# Patient Record
Sex: Male | Born: 1945 | Race: Black or African American | Hispanic: No | Marital: Single | Smoking: Former smoker
Health system: Southern US, Community
[De-identification: ages and names within clinical notes are randomized; demographics above are authoritative.]

## PROBLEM LIST (undated history)

## (undated) DIAGNOSIS — K635 Polyp of colon: Secondary | ICD-10-CM

## (undated) DIAGNOSIS — E119 Type 2 diabetes mellitus without complications: Secondary | ICD-10-CM

## (undated) DIAGNOSIS — I10 Essential (primary) hypertension: Secondary | ICD-10-CM

## (undated) DIAGNOSIS — E11319 Type 2 diabetes mellitus with unspecified diabetic retinopathy without macular edema: Secondary | ICD-10-CM

## (undated) DIAGNOSIS — D72829 Elevated white blood cell count, unspecified: Secondary | ICD-10-CM

## (undated) DIAGNOSIS — F0390 Unspecified dementia without behavioral disturbance: Secondary | ICD-10-CM

## (undated) HISTORY — PX: HERNIA REPAIR: SHX51

## (undated) HISTORY — PX: COLONOSCOPY: SHX174

---

## 2017-03-22 ENCOUNTER — Ambulatory Visit: Payer: Medicare Other | Admitting: Podiatry

## 2021-04-10 ENCOUNTER — Emergency Department (HOSPITAL_COMMUNITY): Payer: No Typology Code available for payment source

## 2021-04-10 ENCOUNTER — Encounter (HOSPITAL_COMMUNITY)
Admission: EM | Disposition: A | Discharge status: Skilled Nursing Facility | Payer: Self-pay | Source: Home / Self Care | Attending: Internal Medicine

## 2021-04-10 ENCOUNTER — Inpatient Hospital Stay (HOSPITAL_COMMUNITY): Payer: No Typology Code available for payment source | Admitting: Anesthesiology

## 2021-04-10 ENCOUNTER — Other Ambulatory Visit: Payer: Self-pay

## 2021-04-10 ENCOUNTER — Inpatient Hospital Stay (HOSPITAL_COMMUNITY)
Admission: EM | Admit: 2021-04-10 | Discharge: 2021-04-27 | DRG: 270 | Disposition: A | Discharge status: Skilled Nursing Facility | Payer: No Typology Code available for payment source | Attending: Internal Medicine | Admitting: Internal Medicine

## 2021-04-10 ENCOUNTER — Encounter (HOSPITAL_COMMUNITY): Payer: Self-pay

## 2021-04-10 DIAGNOSIS — I998 Other disorder of circulatory system: Secondary | ICD-10-CM

## 2021-04-10 DIAGNOSIS — I70261 Atherosclerosis of native arteries of extremities with gangrene, right leg: Secondary | ICD-10-CM | POA: Diagnosis not present

## 2021-04-10 DIAGNOSIS — Z20822 Contact with and (suspected) exposure to covid-19: Secondary | ICD-10-CM | POA: Diagnosis present

## 2021-04-10 DIAGNOSIS — I82411 Acute embolism and thrombosis of right femoral vein: Secondary | ICD-10-CM | POA: Diagnosis not present

## 2021-04-10 DIAGNOSIS — I70221 Atherosclerosis of native arteries of extremities with rest pain, right leg: Secondary | ICD-10-CM | POA: Diagnosis present

## 2021-04-10 DIAGNOSIS — F03918 Unspecified dementia, unspecified severity, with other behavioral disturbance: Secondary | ICD-10-CM | POA: Diagnosis present

## 2021-04-10 DIAGNOSIS — I743 Embolism and thrombosis of arteries of the lower extremities: Secondary | ICD-10-CM | POA: Diagnosis not present

## 2021-04-10 DIAGNOSIS — L02214 Cutaneous abscess of groin: Secondary | ICD-10-CM | POA: Diagnosis present

## 2021-04-10 DIAGNOSIS — E119 Type 2 diabetes mellitus without complications: Secondary | ICD-10-CM

## 2021-04-10 DIAGNOSIS — I10 Essential (primary) hypertension: Secondary | ICD-10-CM | POA: Diagnosis present

## 2021-04-10 DIAGNOSIS — C9110 Chronic lymphocytic leukemia of B-cell type not having achieved remission: Secondary | ICD-10-CM | POA: Diagnosis present

## 2021-04-10 DIAGNOSIS — E872 Acidosis, unspecified: Secondary | ICD-10-CM | POA: Diagnosis present

## 2021-04-10 DIAGNOSIS — C8440 Peripheral T-cell lymphoma, not classified, unspecified site: Secondary | ICD-10-CM | POA: Diagnosis present

## 2021-04-10 DIAGNOSIS — L89302 Pressure ulcer of unspecified buttock, stage 2: Secondary | ICD-10-CM | POA: Diagnosis not present

## 2021-04-10 DIAGNOSIS — G9341 Metabolic encephalopathy: Secondary | ICD-10-CM | POA: Diagnosis present

## 2021-04-10 DIAGNOSIS — Z79899 Other long term (current) drug therapy: Secondary | ICD-10-CM | POA: Diagnosis not present

## 2021-04-10 DIAGNOSIS — D649 Anemia, unspecified: Secondary | ICD-10-CM | POA: Diagnosis not present

## 2021-04-10 DIAGNOSIS — F0390 Unspecified dementia without behavioral disturbance: Secondary | ICD-10-CM | POA: Diagnosis not present

## 2021-04-10 DIAGNOSIS — E113399 Type 2 diabetes mellitus with moderate nonproliferative diabetic retinopathy without macular edema, unspecified eye: Secondary | ICD-10-CM

## 2021-04-10 DIAGNOSIS — N179 Acute kidney failure, unspecified: Secondary | ICD-10-CM | POA: Diagnosis present

## 2021-04-10 DIAGNOSIS — Z7984 Long term (current) use of oral hypoglycemic drugs: Secondary | ICD-10-CM

## 2021-04-10 DIAGNOSIS — Z01810 Encounter for preprocedural cardiovascular examination: Secondary | ICD-10-CM | POA: Diagnosis not present

## 2021-04-10 DIAGNOSIS — E1151 Type 2 diabetes mellitus with diabetic peripheral angiopathy without gangrene: Secondary | ICD-10-CM | POA: Diagnosis present

## 2021-04-10 DIAGNOSIS — D479 Neoplasm of uncertain behavior of lymphoid, hematopoietic and related tissue, unspecified: Secondary | ICD-10-CM

## 2021-04-10 DIAGNOSIS — R739 Hyperglycemia, unspecified: Secondary | ICD-10-CM

## 2021-04-10 DIAGNOSIS — F05 Delirium due to known physiological condition: Secondary | ICD-10-CM | POA: Diagnosis present

## 2021-04-10 DIAGNOSIS — L89892 Pressure ulcer of other site, stage 2: Secondary | ICD-10-CM | POA: Diagnosis present

## 2021-04-10 DIAGNOSIS — F17210 Nicotine dependence, cigarettes, uncomplicated: Secondary | ICD-10-CM | POA: Diagnosis present

## 2021-04-10 DIAGNOSIS — D696 Thrombocytopenia, unspecified: Secondary | ICD-10-CM | POA: Diagnosis present

## 2021-04-10 DIAGNOSIS — D62 Acute posthemorrhagic anemia: Secondary | ICD-10-CM | POA: Diagnosis not present

## 2021-04-10 DIAGNOSIS — D72829 Elevated white blood cell count, unspecified: Secondary | ICD-10-CM | POA: Diagnosis not present

## 2021-04-10 DIAGNOSIS — E1165 Type 2 diabetes mellitus with hyperglycemia: Secondary | ICD-10-CM | POA: Diagnosis present

## 2021-04-10 DIAGNOSIS — L8990 Pressure ulcer of unspecified site, unspecified stage: Secondary | ICD-10-CM | POA: Insufficient documentation

## 2021-04-10 HISTORY — DX: Polyp of colon: K63.5

## 2021-04-10 HISTORY — DX: Unspecified dementia, unspecified severity, without behavioral disturbance, psychotic disturbance, mood disturbance, and anxiety: F03.90

## 2021-04-10 HISTORY — DX: Type 2 diabetes mellitus without complications: E11.9

## 2021-04-10 HISTORY — DX: Essential (primary) hypertension: I10

## 2021-04-10 HISTORY — PX: THROMBECTOMY FEMORAL ARTERY: SHX6406

## 2021-04-10 HISTORY — DX: Type 2 diabetes mellitus with unspecified diabetic retinopathy without macular edema: E11.319

## 2021-04-10 HISTORY — PX: FASCIOTOMY: SHX132

## 2021-04-10 HISTORY — PX: FEMORAL-POPLITEAL BYPASS GRAFT: SHX937

## 2021-04-10 LAB — CBC WITH DIFFERENTIAL/PLATELET
Abs Immature Granulocytes: 0 10*3/uL (ref 0.00–0.07)
Basophils Absolute: 0 10*3/uL (ref 0.0–0.1)
Basophils Relative: 0 %
Eosinophils Absolute: 0 10*3/uL (ref 0.0–0.5)
Eosinophils Relative: 0 %
HCT: 33.7 % — ABNORMAL LOW (ref 39.0–52.0)
Hemoglobin: 11 g/dL — ABNORMAL LOW (ref 13.0–17.0)
Lymphocytes Relative: 85 %
Lymphs Abs: 112.4 10*3/uL — ABNORMAL HIGH (ref 0.7–4.0)
MCH: 29.5 pg (ref 26.0–34.0)
MCHC: 32.6 g/dL (ref 30.0–36.0)
MCV: 90.3 fL (ref 80.0–100.0)
Monocytes Absolute: 4 10*3/uL — ABNORMAL HIGH (ref 0.1–1.0)
Monocytes Relative: 3 %
Neutro Abs: 15.9 10*3/uL — ABNORMAL HIGH (ref 1.7–7.7)
Neutrophils Relative %: 12 %
Platelets: 158 10*3/uL (ref 150–400)
RBC: 3.73 MIL/uL — ABNORMAL LOW (ref 4.22–5.81)
RDW: 13 % (ref 11.5–15.5)
WBC: 132.2 10*3/uL (ref 4.0–10.5)
nRBC: 0 % (ref 0.0–0.2)
nRBC: 0 /100 WBC

## 2021-04-10 LAB — LACTIC ACID, PLASMA
Lactic Acid, Venous: 1.9 mmol/L (ref 0.5–1.9)
Lactic Acid, Venous: 2.1 mmol/L (ref 0.5–1.9)

## 2021-04-10 LAB — BASIC METABOLIC PANEL
Anion gap: 11 (ref 5–15)
BUN: 36 mg/dL — ABNORMAL HIGH (ref 8–23)
CO2: 27 mmol/L (ref 22–32)
Calcium: 9.3 mg/dL (ref 8.9–10.3)
Chloride: 99 mmol/L (ref 98–111)
Creatinine, Ser: 1.1 mg/dL (ref 0.61–1.24)
GFR, Estimated: >60 mL/min (ref >60)
Glucose, Bld: 367 mg/dL — ABNORMAL HIGH (ref 70–99)
Potassium: 4.7 mmol/L (ref 3.5–5.1)
Sodium: 137 mmol/L (ref 135–145)

## 2021-04-10 LAB — RESP PANEL BY RT-PCR (FLU A&B, COVID) ARPGX2
Influenza A by PCR: NEGATIVE
Influenza B by PCR: NEGATIVE
SARS Coronavirus 2 by RT PCR: NEGATIVE

## 2021-04-10 LAB — CBC
HCT: 38.7 % — ABNORMAL LOW (ref 39.0–52.0)
HCT: 32.4 % — ABNORMAL LOW (ref 39.0–52.0)
Hemoglobin: 12.4 g/dL — ABNORMAL LOW (ref 13.0–17.0)
Hemoglobin: 10.5 g/dL — ABNORMAL LOW (ref 13.0–17.0)
MCH: 29.2 pg (ref 26.0–34.0)
MCH: 29.6 pg (ref 26.0–34.0)
MCHC: 32 g/dL (ref 30.0–36.0)
MCHC: 32.4 g/dL (ref 30.0–36.0)
MCV: 91.3 fL (ref 80.0–100.0)
MCV: 91.3 fL (ref 80.0–100.0)
Platelets: 169 10*3/uL (ref 150–400)
Platelets: 142 10*3/uL — ABNORMAL LOW (ref 150–400)
RBC: 4.24 MIL/uL (ref 4.22–5.81)
RBC: 3.55 MIL/uL — ABNORMAL LOW (ref 4.22–5.81)
RDW: 13.2 % (ref 11.5–15.5)
RDW: 13 % (ref 11.5–15.5)
WBC: 134.8 10*3/uL (ref 4.0–10.5)
WBC: 134 10*3/uL (ref 4.0–10.5)
nRBC: 0 % (ref 0.0–0.2)
nRBC: 0 % (ref 0.0–0.2)

## 2021-04-10 LAB — SAVE SMEAR(SSMR), FOR PROVIDER SLIDE REVIEW

## 2021-04-10 LAB — HEMOGLOBIN A1C
Hgb A1c MFr Bld: 11.5 % — ABNORMAL HIGH (ref 4.8–5.6)
Mean Plasma Glucose: 283.35 mg/dL

## 2021-04-10 LAB — CBG MONITORING, ED: Glucose-Capillary: 380 mg/dL — ABNORMAL HIGH (ref 70–99)

## 2021-04-10 LAB — TECHNOLOGIST SMEAR REVIEW

## 2021-04-10 LAB — CK: Total CK: 266 U/L (ref 49–397)

## 2021-04-10 SURGERY — THROMBECTOMY, ARTERY, FEMORAL
Anesthesia: General | Site: Leg Upper | Laterality: Right

## 2021-04-10 MED ORDER — INSULIN REGULAR(HUMAN) IN NACL 100-0.9 UT/100ML-% IV SOLN
INTRAVENOUS | Status: AC
  Administered 2021-04-10: 14 [IU]/h via INTRAVENOUS
  Filled 2021-04-10: qty 100

## 2021-04-10 MED ORDER — LACTATED RINGERS IV BOLUS
1000.0000 mL | Freq: Once | INTRAVENOUS | Status: AC
  Administered 2021-04-10: 1000 mL via INTRAVENOUS

## 2021-04-10 MED ORDER — CEFAZOLIN SODIUM 1 G IJ SOLR
INTRAMUSCULAR | Status: AC
  Filled 2021-04-10: qty 30

## 2021-04-10 MED ORDER — LACTATED RINGERS IV SOLN: INTRAVENOUS | Status: DC | PRN

## 2021-04-10 MED ORDER — PIPERACILLIN-TAZOBACTAM 3.375 G IVPB 30 MIN
3.3750 g | Freq: Once | INTRAVENOUS | Status: AC
  Administered 2021-04-10: 3.375 g via INTRAVENOUS
  Filled 2021-04-10: qty 50

## 2021-04-10 MED ORDER — CEFAZOLIN SODIUM-DEXTROSE 2-3 GM-%(50ML) IV SOLR
INTRAVENOUS | Status: DC | PRN
Start: 2021-04-10 — End: 2021-04-11
  Administered 2021-04-10: 2 g via INTRAVENOUS

## 2021-04-10 MED ORDER — PROPOFOL 10 MG/ML IV BOLUS
INTRAVENOUS | Status: DC | PRN
  Administered 2021-04-10: 100 mg via INTRAVENOUS

## 2021-04-10 MED ORDER — FENTANYL CITRATE PF 50 MCG/ML IJ SOSY
50.0000 ug | PREFILLED_SYRINGE | Freq: Once | INTRAMUSCULAR | Status: AC
Start: 2021-04-10 — End: 2021-04-10
  Administered 2021-04-10: 50 ug via INTRAVENOUS
  Filled 2021-04-10: qty 1

## 2021-04-10 MED ORDER — HEPARIN 6000 UNIT IRRIGATION SOLUTION
Status: DC | PRN
  Administered 2021-04-10: 1

## 2021-04-10 MED ORDER — AMISULPRIDE (ANTIEMETIC) 5 MG/2ML IV SOLN: 10.0000 mg | Freq: Once | INTRAVENOUS | Status: DC | PRN

## 2021-04-10 MED ORDER — HEPARIN SODIUM (PORCINE) 1000 UNIT/ML IJ SOLN
INTRAMUSCULAR | Status: DC | PRN
  Administered 2021-04-10: 6000 [IU] via INTRAVENOUS
  Administered 2021-04-11: 5000 [IU] via INTRAVENOUS

## 2021-04-10 MED ORDER — ALBUMIN HUMAN 5 % IV SOLN
INTRAVENOUS | Status: DC | PRN
Start: 2021-04-10 — End: 2021-04-11

## 2021-04-10 MED ORDER — HEPARIN 6000 UNIT IRRIGATION SOLUTION
Status: AC
  Filled 2021-04-10: qty 500

## 2021-04-10 MED ORDER — ONDANSETRON HCL 4 MG/2ML IJ SOLN: 4.0000 mg | Freq: Four times a day (QID) | INTRAMUSCULAR | Status: DC | PRN

## 2021-04-10 MED ORDER — ACETAMINOPHEN 650 MG RE SUPP: 650.0000 mg | Freq: Four times a day (QID) | RECTAL | Status: DC | PRN

## 2021-04-10 MED ORDER — PROMETHAZINE HCL 25 MG/ML IJ SOLN: 6.2500 mg | INTRAMUSCULAR | Status: DC | PRN

## 2021-04-10 MED ORDER — INSULIN ASPART 100 UNIT/ML IJ SOLN: 0.0000 [IU] | INTRAMUSCULAR | Status: DC

## 2021-04-10 MED ORDER — 0.9 % SODIUM CHLORIDE (POUR BTL) OPTIME
TOPICAL | Status: DC | PRN
  Administered 2021-04-10: 2000 mL

## 2021-04-10 MED ORDER — HYDROMORPHONE HCL 1 MG/ML IJ SOLN
0.5000 mg | INTRAMUSCULAR | Status: DC | PRN
  Administered 2021-04-13 – 2021-04-17 (×13): 1 mg via INTRAVENOUS
  Filled 2021-04-10 (×15): qty 1

## 2021-04-10 MED ORDER — FENTANYL CITRATE (PF) 250 MCG/5ML IJ SOLN
INTRAMUSCULAR | Status: DC | PRN
  Administered 2021-04-10 – 2021-04-11 (×6): 50 ug via INTRAVENOUS

## 2021-04-10 MED ORDER — FENTANYL CITRATE (PF) 250 MCG/5ML IJ SOLN
INTRAMUSCULAR | Status: AC
  Filled 2021-04-10: qty 5

## 2021-04-10 MED ORDER — HEPARIN (PORCINE) 25000 UT/250ML-% IV SOLN
1150.0000 [IU]/h | INTRAVENOUS | Status: DC
  Filled 2021-04-10 (×2): qty 250

## 2021-04-10 MED ORDER — ACETAMINOPHEN 325 MG PO TABS
650.0000 mg | ORAL_TABLET | Freq: Four times a day (QID) | ORAL | Status: DC | PRN
  Administered 2021-04-11 – 2021-04-21 (×7): 650 mg via ORAL
  Filled 2021-04-10 (×7): qty 2

## 2021-04-10 MED ORDER — PROPOFOL 10 MG/ML IV BOLUS
INTRAVENOUS | Status: AC
  Filled 2021-04-10: qty 20

## 2021-04-10 MED ORDER — FENTANYL CITRATE (PF) 100 MCG/2ML IJ SOLN: 25.0000 ug | INTRAMUSCULAR | Status: DC | PRN

## 2021-04-10 MED ORDER — HEPARIN BOLUS VIA INFUSION
4000.0000 [IU] | Freq: Once | INTRAVENOUS | Status: DC
  Filled 2021-04-10: qty 4000

## 2021-04-10 MED ORDER — SUCCINYLCHOLINE CHLORIDE 200 MG/10ML IV SOSY
PREFILLED_SYRINGE | INTRAVENOUS | Status: DC | PRN
  Administered 2021-04-10: 120 mg via INTRAVENOUS

## 2021-04-10 MED ORDER — LIDOCAINE HCL (CARDIAC) PF 100 MG/5ML IV SOSY
PREFILLED_SYRINGE | INTRAVENOUS | Status: DC | PRN
  Administered 2021-04-10: 100 mg via INTRATRACHEAL

## 2021-04-10 MED ORDER — LACTATED RINGERS IV SOLN: INTRAVENOUS | Status: DC

## 2021-04-10 MED ORDER — ONDANSETRON HCL 4 MG PO TABS: 4.0000 mg | ORAL_TABLET | Freq: Four times a day (QID) | ORAL | Status: DC | PRN

## 2021-04-10 MED ORDER — PHENYLEPHRINE HCL-NACL 20-0.9 MG/250ML-% IV SOLN
INTRAVENOUS | Status: DC | PRN
  Administered 2021-04-10: 20 ug/min via INTRAVENOUS

## 2021-04-10 SURGICAL SUPPLY — 70 items
BAG COUNTER SPONGE SURGICOUNT (BAG) ×3 IMPLANT
BANDAGE ESMARK 6X9 LF (GAUZE/BANDAGES/DRESSINGS) IMPLANT
BNDG ELASTIC 4X5.8 VLCR STR LF (GAUZE/BANDAGES/DRESSINGS) IMPLANT
BNDG ELASTIC 6X5.8 VLCR STR LF (GAUZE/BANDAGES/DRESSINGS) ×3 IMPLANT
BNDG ESMARK 6X9 LF (GAUZE/BANDAGES/DRESSINGS)
BNDG GAUZE ELAST 4 BULKY (GAUZE/BANDAGES/DRESSINGS) ×6 IMPLANT
CANISTER SUCT 3000ML PPV (MISCELLANEOUS) ×3 IMPLANT
CANNULA VESSEL 3MM 2 BLNT TIP (CANNULA) IMPLANT
CATH EMB 3FR 80CM (CATHETERS) ×6 IMPLANT
CATH EMB 4FR 80CM (CATHETERS) ×3 IMPLANT
CATH EMB 5FR 80CM (CATHETERS) IMPLANT
CLIP LIGATING EXTRA MED SLVR (CLIP) ×3 IMPLANT
CLIP LIGATING EXTRA SM BLUE (MISCELLANEOUS) ×3 IMPLANT
CLIP VESOCCLUDE SM WIDE 24/CT (CLIP) ×3 IMPLANT
CNTNR URN SCR LID CUP LEK RST (MISCELLANEOUS) ×6 IMPLANT
CONT SPEC 4OZ STRL OR WHT (MISCELLANEOUS) ×3
CUFF TOURN SGL QUICK 24 (TOURNIQUET CUFF)
CUFF TOURN SGL QUICK 34 (TOURNIQUET CUFF)
CUFF TOURN SGL QUICK 42 (TOURNIQUET CUFF) IMPLANT
CUFF TRNQT CYL 24X4X16.5-23 (TOURNIQUET CUFF) IMPLANT
CUFF TRNQT CYL 34X4.125X (TOURNIQUET CUFF) IMPLANT
DERMABOND ADVANCED (GAUZE/BANDAGES/DRESSINGS) ×1
DERMABOND ADVANCED .7 DNX12 (GAUZE/BANDAGES/DRESSINGS) ×2 IMPLANT
DRAIN CHANNEL 15F RND FF W/TCR (WOUND CARE) IMPLANT
DRAPE HALF SHEET 40X57 (DRAPES) IMPLANT
DRAPE X-RAY CASS 24X20 (DRAPES) IMPLANT
DRSG COVADERM 4X6 (GAUZE/BANDAGES/DRESSINGS) ×3 IMPLANT
DRSG COVADERM 4X8 (GAUZE/BANDAGES/DRESSINGS) ×3 IMPLANT
ELECT REM PT RETURN 9FT ADLT (ELECTROSURGICAL) ×3
ELECTRODE REM PT RTRN 9FT ADLT (ELECTROSURGICAL) ×2 IMPLANT
EVACUATOR SILICONE 100CC (DRAIN) IMPLANT
GLOVE SURG ENC MOIS LTX SZ7.5 (GLOVE) ×3 IMPLANT
GLOVE SURG POLYISO LF SZ6.5 (GLOVE) ×3 IMPLANT
GOWN STRL REUS W/ TWL LRG LVL3 (GOWN DISPOSABLE) ×4 IMPLANT
GOWN STRL REUS W/ TWL XL LVL3 (GOWN DISPOSABLE) ×2 IMPLANT
GOWN STRL REUS W/TWL LRG LVL3 (GOWN DISPOSABLE) ×2
GOWN STRL REUS W/TWL XL LVL3 (GOWN DISPOSABLE) ×1
GRAFT PROPATEN W/RING 6X80X60 (Vascular Products) ×3 IMPLANT
INSERT FOGARTY SM (MISCELLANEOUS) ×3 IMPLANT
KIT BASIN OR (CUSTOM PROCEDURE TRAY) ×3 IMPLANT
KIT TURNOVER KIT B (KITS) ×3 IMPLANT
MARKER GRAFT CORONARY BYPASS (MISCELLANEOUS) IMPLANT
NS IRRIG 1000ML POUR BTL (IV SOLUTION) ×6 IMPLANT
PACK PERIPHERAL VASCULAR (CUSTOM PROCEDURE TRAY) ×3 IMPLANT
PAD ARMBOARD 7.5X6 YLW CONV (MISCELLANEOUS) ×6 IMPLANT
POWDER SURGICEL 3.0 GRAM (HEMOSTASIS) ×6 IMPLANT
SET COLLECT BLD 21X3/4 12 (NEEDLE) IMPLANT
STAPLER VISISTAT 35W (STAPLE) ×3 IMPLANT
STOPCOCK 4 WAY LG BORE MALE ST (IV SETS) IMPLANT
SUT ETHILON 3 0 PS 1 (SUTURE) IMPLANT
SUT GORETEX 6.0 TT13 (SUTURE) IMPLANT
SUT GORETEX 6.0 TT9 (SUTURE) IMPLANT
SUT MNCRL AB 4-0 PS2 18 (SUTURE) ×3 IMPLANT
SUT PROLENE 5 0 C 1 24 (SUTURE) ×9 IMPLANT
SUT PROLENE 6 0 BV (SUTURE) ×18 IMPLANT
SUT PROLENE 7 0 BV 1 (SUTURE) IMPLANT
SUT SILK 2 0 SH (SUTURE) ×3 IMPLANT
SUT SILK 3 0 (SUTURE) ×1
SUT SILK 3-0 18XBRD TIE 12 (SUTURE) ×2 IMPLANT
SUT VIC AB 2-0 CT1 27 (SUTURE) ×2
SUT VIC AB 2-0 CT1 TAPERPNT 27 (SUTURE) ×4 IMPLANT
SUT VIC AB 3-0 SH 27 (SUTURE) ×3
SUT VIC AB 3-0 SH 27X BRD (SUTURE) ×6 IMPLANT
SYR 3ML LL SCALE MARK (SYRINGE) ×6 IMPLANT
TAPE UMBILICAL COTTON 1/8X30 (MISCELLANEOUS) IMPLANT
TOWEL GREEN STERILE (TOWEL DISPOSABLE) ×3 IMPLANT
TRAY FOLEY MTR SLVR 16FR STAT (SET/KITS/TRAYS/PACK) IMPLANT
TUBING EXTENTION W/L.L. (IV SETS) IMPLANT
UNDERPAD 30X36 HEAVY ABSORB (UNDERPADS AND DIAPERS) ×3 IMPLANT
WATER STERILE IRR 1000ML POUR (IV SOLUTION) ×3 IMPLANT

## 2021-04-10 NOTE — ED Triage Notes (Signed)
BIB EMS after daughter found patient in the floor and unable to get up.  Patient has hx of dementia and is only oriented x person.  Patient cbg with EMS >600.  Unknown when patient last ate even though caregiver comes in.  Daughter at bedside.

## 2021-04-10 NOTE — Consult Note (Signed)
Hospital Consult    Reason for Consult: Acute right lower extremity ischemia Referring Physician: Dr. Melina Copa MRN #:  [id number]  History of Present Illness 75 y.o. male without significant past vascular history.  He is a longtime smoker for least 50 years and also has diabetes.  History is limited by dementia but his daughter is able to provide adequate history.  He was last seen well last Wednesday by a caregiver.  She attempted to contact him on Friday but he would not answer the door.  The daughter was unable to make contact with him today where he was found to be on the floor for unknown period of time.  Patient was having difficulty walking due to pain in his right leg.  He has been unable to tell how long he was down given history of dementia.  Previously walks without limitations.  He has no previous lower extremity surgeries.  He does not take any blood thinners.  Previously took low-dose aspirin does not take any longer.  He cannot recall any abnormal heart rhythms or personal or family history of aneurysm disease.  Past Medical History:  Diagnosis Date   Dementia ([place])    Diabetes mellitus without complication ([place])      No Known Allergies  Prior to Admission medications   Medication Sig Start Date End Date Taking? Authorizing Provider  Cholecalciferol 25 MCG (1000 UT) TBDP Take 1 tablet by mouth daily.   Yes [provider]  donepezil (ARICEPT) 10 MG tablet Take 10 mg by mouth at bedtime.   Yes [provider]  lisinopril (ZESTRIL) 5 MG tablet Take 5 mg by mouth daily.   Yes [provider]  metFORMIN (GLUCOPHAGE) 500 MG tablet Take 500 mg by mouth 2 (two) times daily with a meal.   Yes [provider]    Social History   Socioeconomic History   Marital status: Unknown    Spouse name: Not on file   Number of children: Not on file   Years of education: Not on file   Highest education level: Not on file  Occupational History   Not on  file  Tobacco Use   Smoking status: Not on file   Smokeless tobacco: Not on file  Substance and Sexual Activity   Alcohol use: Not on file   Drug use: Not on file   Sexual activity: Not on file  Other Topics Concern   Not on file  Social History Narrative   Not on file   Social Determinants of Health   Financial Resource Strain: Not on file  Food Insecurity: Not on file  Transportation Needs: Not on file  Physical Activity: Not on file  Stress: Not on file  Social Connections: Not on file  Intimate Partner Violence: Not on file     No family history on file.  ROS: Right lower extremity pain  Physical Examination  Vitals:   04/10/21 1845 04/10/21 1930  BP: (!) 146/101 (!) 132/116  Pulse: (!) 104 84  Resp: 16 18  Temp:    SpO2: 92% 99%   There is no height or weight on file to calculate BMI.  General: No acute distress HENT: WNL, normocephalic Pulmonary: normal non-labored breathing Cardiac: 2+ palpable common femoral pulses Left popliteal pulses 3+, right popliteal pulses not palpable Palpable left PT  No signals discernible on the right Abdomen: soft, NT/ND, no masses Extremities: Right foot is ice cold from the midfoot with mottling distally and dark discoloration of the  tips of the toes extending back to the head of the first metatarsal Neurologic: Normal sensation and motor function of left lower extremity Right lower extremity he is able to bend the knee although appears weaker than left, he has sensation to the mid calf area no sensation distal to this Psychiatric: Answers questions appropriately   CBC    Component Value Date/Time   WBC 134.8 (HH) 04/10/2021 1841   RBC 4.24 04/10/2021 1841   HGB 12.4 (L) 04/10/2021 1841   HCT 38.7 (L) 04/10/2021 1841   PLT 169 04/10/2021 1841   MCV 91.3 04/10/2021 1841   MCH 29.2 04/10/2021 1841   MCHC 32.0 04/10/2021 1841   RDW 13.2 04/10/2021 1841    BMET    Component Value Date/Time   NA 137 04/10/2021  1841   K 4.7 04/10/2021 1841   CL 99 04/10/2021 1841   CO2 27 04/10/2021 1841   GLUCOSE 367 (H) 04/10/2021 1841   BUN 36 (H) 04/10/2021 1841   CREATININE 1.10 04/10/2021 1841   CALCIUM 9.3 04/10/2021 1841   GFRNONAA >60 04/10/2021 1841    COAGS: No results found for: INR, PROTIME   Non-Invasive Vascular Imaging:   No studies   ASSESSMENT/PLAN: This is a 75 y.o. male here with acute right lower extremity ischemia of unknown etiology or duration.  He also has leukocytosis with white blood cell count measuring 135,000 today.  I discussed with the daughter that certainly the foot is ischemic but does not account for that high of leukocytosis.  This may certainly be related to his ischemia either the cause or the an effect.  I discussed with her that he will likely require at least amputation of his toes, more likely at least midfoot but possibly below-knee amputation during this hospitalization.  I discussed with her the need for urgent revascularization to attempt limb salvage tonight.  Heparin drip was started prior to transfer to [facility].  We will plan for right lower extremity thrombectomy from a below-knee approach.  I have some concern that this could be from a thrombosed right popliteal artery aneurysm given the size of the left popliteal artery however I do not feel any enlarged area behind his knee to suggest there was an aneurysm.  There may also be a chronic component to this given his long history of smoking and diabetes which would complicate revascularization.  I discussed with the daughter possible bypass and likely need for fasciotomies all of which may fail due to the above-mentioned complexities.  Surgery also brings with the risk of cardiopulmonary complications which may require prolonged intubation could result in coronary artery ischemia as well.  There are risks with anesthesia, there is a risk of revascularization making him sicker.  She demonstrates good understanding and  we will plan for right lower extremity revascularization tonight with fasciotomies.  Lancer Thurner C. Donzetta Matters, MD [facility] Office: [phone] Pager: [phone]

## 2021-04-10 NOTE — H&P (Addendum)
History and Physical    Scott Garrett LGX:[id number] DOB: Jul 10, 1945 DOA: 04/10/2021  PCP: [name]  Patient coming from: Home  I have personally briefly reviewed patient's old medical records in [place]  Chief Complaint: Found down  HPI: Cong Hightower is a 75 y.o. male with medical history significant of dementia, DM2, HTN.  Pt lives at home alone but caregivers come in 3 times a week.  Pt disoriented at baseline.  Today is Monday and the caregiver last saw him on Wednesday.  On Friday, the caregiver went to his house but he told her not to come in and she did not.  Today daughter got back and went to check on him and had to unlock the door because he would not let her in.  She found him sitting on the floor.  It seems like he has been urinating and bottles and is unclear how long he has been on the floor.  R foot discolored, painful to touch.  Mental status seems to be at baseline (disoriented).  Pt complaining of R foot pain and thirst.   ED Course: CPK 266  BGL 367.  WBC 134.8k  was 10k in June 2021.  Lactate 1.9.   Review of Systems: Unable to perform due to dementia.  Past Medical History:  Diagnosis Date   Colon polyps    Dementia ([place])    Diabetic retinopathy ([place])    DM2 (diabetes mellitus, type 2) ([place])    HTN (hypertension)     Past Surgical History:  Procedure Laterality Date   COLONOSCOPY       reports that he has been smoking cigarettes. He does not have any smokeless tobacco history on file. No history on file for alcohol use and drug use.  No Known Allergies  Family History  Family history unknown: Yes     Prior to Admission medications   Medication Sig Start Date End Date Taking? Authorizing Provider  Cholecalciferol 25 MCG (1000 UT) TBDP Take 1 tablet by mouth daily.   Yes [provider]  donepezil (ARICEPT) 10 MG tablet Take 10 mg by mouth at bedtime.   Yes [provider]  lisinopril  (ZESTRIL) 5 MG tablet Take 5 mg by mouth daily.   Yes [provider]  metFORMIN (GLUCOPHAGE) 500 MG tablet Take 500 mg by mouth 2 (two) times daily with a meal.   Yes [provider]    Physical Exam: Vitals:   04/10/21 1840 04/10/21 1845 04/10/21 1930  BP: (!) 149/84 (!) 146/101 (!) 132/116  Pulse: (!) 102 (!) 104 84  Resp: 15 16 18   Temp: 98.4 F (36.9 C)    TempSrc: Oral    SpO2: 97% 92% 99%  Weight:   81.6 kg    Constitutional: NAD, calm, comfortable Eyes: PERRL, lids and conjunctivae normal ENMT: Mucous membranes are moist. Posterior pharynx clear of any exudate or lesions.Normal dentition.  Neck: normal, supple, no masses, no thyromegaly Respiratory: clear to auscultation bilaterally, no wheezing, no crackles. Normal respiratory effort. No accessory muscle use.  Cardiovascular: No doppler able pulse on R foot No carotid bruits.  Abdomen: no tenderness, no masses palpated. No hepatosplenomegaly. Bowel sounds positive.  Musculoskeletal:  No joint deformity upper and lower extremities. Good ROM, no contractures. Normal muscle tone.  Skin:  Neurologic: CN 2-12 grossly intact. Sensation intact, DTR normal. Strength 5/5 in all 4.  Psychiatric: Disoriented   Labs on Admission: I have personally reviewed following labs and imaging studies  CBC: Recent Labs  Lab 04/10/21 1841  WBC 134.8*  HGB 12.4*  HCT 38.7*  MCV 91.3  PLT 300   Basic Metabolic Panel: Recent Labs  Lab 04/10/21 1841  NA 137  K 4.7  CL 99  CO2 27  GLUCOSE 367*  BUN 36*  CREATININE 1.10  CALCIUM 9.3   GFR: CrCl cannot be calculated (Unknown ideal weight.). Liver Function Tests: No results for input(s): AST, ALT, ALKPHOS, BILITOT, PROT, ALBUMIN in the last 168 hours. No results for input(s): LIPASE, AMYLASE in the last 168 hours. No results for input(s): AMMONIA in the last 168 hours. Coagulation Profile: No results for input(s): INR, PROTIME in the last 168 hours. Cardiac  Enzymes: Recent Labs  Lab 04/10/21 1940  CKTOTAL 266   BNP (last 3 results) No results for input(s): PROBNP in the last 8760 hours. HbA1C: No results for input(s): HGBA1C in the last 72 hours. CBG: Recent Labs  Lab 04/10/21 1843  GLUCAP 380*   Lipid Profile: No results for input(s): CHOL, HDL, LDLCALC, TRIG, CHOLHDL, LDLDIRECT in the last 72 hours. Thyroid Function Tests: No results for input(s): TSH, T4TOTAL, FREET4, T3FREE, THYROIDAB in the last 72 hours. Anemia Panel: No results for input(s): VITAMINB12, FOLATE, FERRITIN, TIBC, IRON, RETICCTPCT in the last 72 hours. Urine analysis: No results found for: COLORURINE, APPEARANCEUR, LABSPEC, [place], GLUCOSEU, HGBUR, BILIRUBINUR, KETONESUR, PROTEINUR, UROBILINOGEN, NITRITE, LEUKOCYTESUR  Radiological Exams on Admission: DG Chest Port 1 View  Result Date: 04/10/2021 CLINICAL DATA:  Weakness EXAM: PORTABLE CHEST 1 VIEW COMPARISON:  None. FINDINGS: Heart and mediastinal contours are within normal limits. No focal opacities or effusions. No acute bony abnormality. IMPRESSION: No active disease. Electronically Signed   By: Rolm Baptise M.D.   On: 04/10/2021 20:31    EKG: Independently reviewed.  Assessment/Plan Principal Problem:   Critical limb ischemia of right lower extremity (HCC) Active Problems:   Dementia (HCC)   DM2 (diabetes mellitus, type 2) (HCC)   HTN (hypertension)    Critical limb ischemia RLE - Pt going to OR emergently for attempt at limb salvage. Repeat CPK in AM Repeat BMP at MN and again in AM Tele monitor Heparin gtt Serial lactates IVF Leukocytosis 134k - Getting zosyn pre-op Not clear that this infectious however, especially given the degree Question leukemia? Get CBC with diff stat Save smear and path smear review Depending on diff: may need onc consult, if acute leukemia may even need transfer to [facility], but going to OR emergently right now for attempted limb salvage. DM2 - Hold  metformin Sensitive SSI Q4H HTN - Holding home BP meds for the moment Use short acting IV if needed Dementia - Chronic and baseline for the moment, high risk of delirium during hospital stay though.  DVT prophylaxis: Heparin gtt Code Status: Full Family Communication: No family in room Disposition Plan: TBD Consults called: Vasc surgery Admission status: Admit to inpatient  Severity of Illness: The appropriate patient status for this patient is INPATIENT. Inpatient status is judged to be reasonable and necessary in order to provide the required intensity of service to ensure the patient's safety. The patient's presenting symptoms, physical exam findings, and initial radiographic and laboratory data in the context of their chronic comorbidities is felt to place them at high risk for further clinical deterioration. Furthermore, it is not anticipated that the patient will be medically stable for discharge from the hospital within 2 midnights of admission.   IP status for limb threatening ischemia requiring emergent OR intervention.  *  I certify that at the point of admission it is my clinical judgment that the patient will require inpatient hospital care spanning beyond 2 midnights from the point of admission due to high intensity of service, high risk for further deterioration and high frequency of surveillance required.*   Darrion Macaulay M. DO [facility]  How to contact the [facility] Attending or Consulting provider [place] or covering provider during after hours [place], for this patient?  Check the care team in [facility] and look for a) attending/consulting [facility] provider listed and b) the [facility] team listed Log into [email or web address]  Amion Physician Scheduling and messaging for groups and whole hospitals  On call and physician scheduling software for group practices, residents, hospitalists and other medical providers for call, clinic, rotation and shift schedules. OnCall [employer] is a  hospital-wide system for scheduling doctors and paging doctors on call. EasyPlot is for scientific plotting and data analysis.  [email or web address]  and use [place]'s universal password to access. If you do not have the password, please contact the hospital operator.  Locate the [facility] provider you are looking for under [facility] and page to a number that you can be directly reached. If you still have difficulty reaching the provider, please page the [facility] (Director on Call) for the Hospitalists listed on amion for assistance.  04/10/2021, 9:57 PM

## 2021-04-10 NOTE — Anesthesia Preprocedure Evaluation (Addendum)
Anesthesia Evaluation  Patient identified by MRN, date of birth, ID band Patient awake    Reviewed: Allergy & Precautions, NPO status , Patient's Chart, lab work & pertinent test results  History of Anesthesia Complications Negative for: history of anesthetic complications  Airway Mallampati: I  TM Distance: >3 FB Neck ROM: Full    Dental  (+) Edentulous Lower, Edentulous Upper, Dental Advisory Given   Pulmonary Current Smoker,    Pulmonary exam normal        Cardiovascular hypertension, Pt. on medications + Peripheral Vascular Disease  Normal cardiovascular exam     Neuro/Psych PSYCHIATRIC DISORDERS Dementia negative neurological ROS     GI/Hepatic negative GI ROS, Neg liver ROS,   Endo/Other  diabetes  Renal/GU negative Renal ROS     Musculoskeletal negative musculoskeletal ROS (+)   Abdominal   Peds  Hematology negative hematology ROS (+)   Anesthesia Other Findings   Reproductive/Obstetrics                            Anesthesia Physical Anesthesia Plan  ASA: 4 and emergent  Anesthesia Plan: General   Post-op Pain Management:    Induction: Intravenous  PONV Risk Score and Plan: 3 and Ondansetron and Diphenhydramine  Airway Management Planned: Oral ETT  Additional Equipment:   Intra-op Plan:   Post-operative Plan: Possible Post-op intubation/ventilation  Informed Consent: I have reviewed the patients History and Physical, chart, labs and discussed the procedure including the risks, benefits and alternatives for the proposed anesthesia with the patient or authorized representative who has indicated his/her understanding and acceptance.     Dental advisory given and Consent reviewed with POA  Plan Discussed with: Anesthesiologist and CRNA  Anesthesia Plan Comments:        Anesthesia Quick Evaluation

## 2021-04-10 NOTE — ED Notes (Signed)
[facility] left without heparin started. Critical wbc of 134 called reported to md

## 2021-04-10 NOTE — ED Notes (Signed)
[facility] called for transfer to [facility] ED  Accepting physician Donzetta Matters

## 2021-04-10 NOTE — ED Provider Notes (Signed)
[facility] DEPT Provider Note   CSN: [id number] Arrival date & time: 04/10/21  1821  LEVEL 5 CAVEAT - DEMENTIA   History Chief Complaint  Patient presents with   Hyperglycemia    Gen Clagg is a 75 y.o. male.  HPI 75 year old male presents via EMS after he was unable to get off the floor.  History is primarily from the daughter due to his dementia.  He lives at home alone but caregivers come in 3 times a week.  Today is Monday and the caregiver last saw him on Wednesday.  Daughter has been.  On Friday, the caregiver went to his house but he told her not to come in and she did not.  Today daughter got back and went to check on him and had to unlock the door because he would not let her in.  She found him sitting on the floor.  It seems like he has been urinating and bottles and is unclear how long he has been on the floor.  She knows so his right foot was discolored and seems to be painful when touched.  Otherwise he is disoriented at baseline but seems to be having normal mental status today per the daughter.  First thing he asked for was cups of water and seemed to be very thirsty.  Past Medical History:  Diagnosis Date   Dementia ([place])    Diabetes mellitus without complication ([place])     There are no problems to display for this patient.      No family history on file.     Home Medications Prior to Admission medications   Not on File    Allergies    Patient has no known allergies.  Review of Systems   Review of Systems  Unable to perform ROS: Dementia   Physical Exam Updated Vital Signs BP (!) 146/101   Pulse (!) 104   Temp 98.4 F (36.9 C) (Oral)   Resp 16   SpO2 92%   Physical Exam Vitals and nursing note reviewed.  Constitutional:      General: He is not in acute distress.    Appearance: He is well-developed. He is not ill-appearing or diaphoretic.  HENT:     Head: Normocephalic and atraumatic.     Right Ear:  External ear normal.     Left Ear: External ear normal.     Nose: Nose normal.     Mouth/Throat:     Mouth: Mucous membranes are dry.  Eyes:     General:        Right eye: No discharge.        Left eye: No discharge.  Cardiovascular:     Rate and Rhythm: Regular rhythm. Tachycardia present.     Pulses:          Popliteal pulses are 1+ on the right side and 2+ on the left side.       Dorsalis pedis pulses are 0 on the right side and detected w/ Doppler on the left side.       Posterior tibial pulses are 0 on the right side.     Heart sounds: Normal heart sounds.  Pulmonary:     Effort: Pulmonary effort is normal.     Breath sounds: Normal breath sounds.  Abdominal:     General: There is no distension.     Palpations: Abdomen is soft.     Tenderness: There is no abdominal tenderness.  Musculoskeletal:  Cervical back: Neck supple.     Comments: Right foot/ankle/lower leg are diffusely swollen. Cool to the touch from mid-lower leg down. See picture for discoloration. Painful to touch.  Skin:    General: Skin is warm and dry.  Neurological:     Mental Status: He is alert. He is disoriented.  Psychiatric:        Mood and Affect: Mood is not anxious.     ED Results / Procedures / Treatments   Labs (all labs ordered are listed, but only abnormal results are displayed) Labs Reviewed  CBC - Abnormal; Notable for the following components:      Result Value   Hemoglobin 12.4 (*)    HCT 38.7 (*)    All other components within normal limits  CBG MONITORING, ED - Abnormal; Notable for the following components:   Glucose-Capillary 380 (*)    All other components within normal limits  RESP PANEL BY RT-PCR (FLU A&B, COVID) ARPGX2  BASIC METABOLIC PANEL  URINALYSIS, ROUTINE W REFLEX MICROSCOPIC  LACTIC ACID, PLASMA  LACTIC ACID, PLASMA  CK  CBC    EKG None  Radiology No results found.  Procedures .Critical Care Performed by: Sherwood Gambler, MD Authorized by:  Sherwood Gambler, MD   Critical care provider statement:    Critical care time (minutes):  40   Critical care time was exclusive of:  Separately billable procedures and treating other patients   Critical care was necessary to treat or prevent imminent or life-threatening deterioration of the following conditions:  Circulatory failure   Critical care was time spent personally by me on the following activities:  Development of treatment plan with patient or surrogate, discussions with consultants, evaluation of patient's response to treatment, examination of patient, obtaining history from patient or surrogate, ordering and performing treatments and interventions, ordering and review of laboratory studies, pulse oximetry, re-evaluation of patient's condition and review of old charts   Medications Ordered in ED Medications  lactated ringers bolus 1,000 mL (1,000 mLs Intravenous New Bag/Given 04/10/21 1924)  fentaNYL (SUBLIMAZE) injection 50 mcg (50 mcg Intravenous Given 04/10/21 1924)    ED Course  I have reviewed the triage vital signs and the nursing notes.  Pertinent labs & imaging results that were available during my care of the patient were reviewed by me and considered in my medical decision making (see chart for details).    MDM Rules/Calculators/A&P                           There is no dopplerable or palpable pulse in the right foot.  Appears acutely ischemic.  I discussed with [name].  It is unclear how long this has been going on.  However at this point he asked for IV heparin and transfer emergently to [facility], ED.  Discussed with Dr. Matilde Sprang who accepts.  I discussed with daughter who agrees.  She understands that the foot might not be salvageable.  He normally can walk at baseline with minimal difficulties.  He will be started on IV fluids, kept n.p.o. and we will start lab work but not wait for results.  Transfer via [place]. Final Clinical Impression(s) / ED  Diagnoses Final diagnoses:  Ischemic foot    Rx / DC Orders ED Discharge Orders     None        Sherwood Gambler, MD 04/10/21 1929

## 2021-04-10 NOTE — ED Notes (Signed)
Called report to [place] charge at [facility]

## 2021-04-10 NOTE — Anesthesia Procedure Notes (Signed)
Procedure Name: Intubation Date/Time: 04/10/2021 11:03 PM Performed by: Clovis Cao, CRNA Pre-anesthesia Checklist: Patient identified, Emergency Drugs available, Suction available and Patient being monitored Patient Re-evaluated:Patient Re-evaluated prior to induction Oxygen Delivery Method: Circle system utilized Preoxygenation: Pre-oxygenation with 100% oxygen Induction Type: IV induction, Rapid sequence and Cricoid Pressure applied Laryngoscope Size: Miller and 2 Grade View: Grade I Tube type: Oral Tube size: 7.5 mm Number of attempts: 1 Airway Equipment and Method: Stylet Placement Confirmation: ETT inserted through vocal cords under direct vision, positive ETCO2 and breath sounds checked- equal and bilateral Secured at: 22 cm Tube secured with: Tape Dental Injury: Teeth and Oropharynx as per pre-operative assessment

## 2021-04-10 NOTE — Progress Notes (Signed)
ANTICOAGULATION CONSULT NOTE  Pharmacy Consult for IV heparin Indication: ischemic foot  No Known Allergies  Patient Measurements: Weight: 81.6 kg (180 lb) Heparin Dosing Weight: TBW  Vital Signs: Temp: 98.4 F (36.9 C) (10/17 1840) Temp Source: Oral (10/17 1840) BP: 132/116 (10/17 1930) Pulse Rate: 84 (10/17 1930)  Labs: Recent Labs    04/10/21 1841  HGB 12.4*  HCT 38.7*  PLT 169  CREATININE 1.10    CrCl cannot be calculated (Unknown ideal weight.).   Medical History: Past Medical History:  Diagnosis Date   Dementia ([place])    Diabetes mellitus without complication (HCC)     Medications:  (Not in a hospital admission)  Scheduled:   heparin  4,000 Units Intravenous Once   PRN:   Assessment: 46 yoM with PMH dementia and DM2 admitted after being found on floor by daughter after unknown length of time. R foot appears to be acutely ischemic; Pharmacy consulted to dose IV heparin pending vascular surgery workup.  Baseline INR, aPTT: not done Prior anticoagulation: none  Significant events:  Today, 04/10/2021: CBC: Hgb slightly low, Plt WNL SCr WNL No bleeding or infusion issues per nursing  Goal of Therapy: Heparin level 0.3-0.7 units/ml Monitor platelets by anticoagulation protocol: Yes  Plan: Heparin 4000 units IV bolus x 1 Heparin 1150 units/hr IV infusion Check heparin level 8 hrs after start Daily CBC, daily heparin level once stable Monitor for signs of bleeding or thrombosis  Reuel Boom, PharmD, BCPS [phone] 04/10/2021, 8:44 PM

## 2021-04-10 NOTE — ED Provider Notes (Signed)
Patient transfer from [facility] for vascular evaluation.  Patient with history of dementia level 5 caveat.  Last seen well 5 days ago.  Found on the floor unknown how long has been down.  Painful swollen right foot.  Decreased sensation right foot. Physical Exam  BP (!) 132/116   Pulse 84   Temp 98.4 F (36.9 C) (Oral)   Resp 18   Wt 81.6 kg   SpO2 99%   Physical Exam  ED Course/Procedures     Procedures  MDM  Patient is awake and alert.  Complaining of right foot pain.  He smokes cigarettes and drinks alcohol, denies history of withdrawal.  Heart is regular rate and rhythm lungs are clear.  Right lower extremity cool below the mid calf with swelling and some darkening at great toe. No Doppler signal in right foot, does have palpable popliteal  9 PM patient evaluated by [name].  He anticipates taking him to the operating room.  Due to the elevated white count he feels patient would better served to be on the medical service.  Althia Forts has been paged.       Hayden Rasmussen, MD 04/11/21 [phone]

## 2021-04-11 ENCOUNTER — Encounter (HOSPITAL_COMMUNITY): Payer: Self-pay | Admitting: Vascular Surgery

## 2021-04-11 DIAGNOSIS — F17210 Nicotine dependence, cigarettes, uncomplicated: Secondary | ICD-10-CM

## 2021-04-11 DIAGNOSIS — I82411 Acute embolism and thrombosis of right femoral vein: Secondary | ICD-10-CM

## 2021-04-11 DIAGNOSIS — D696 Thrombocytopenia, unspecified: Secondary | ICD-10-CM

## 2021-04-11 DIAGNOSIS — I70221 Atherosclerosis of native arteries of extremities with rest pain, right leg: Secondary | ICD-10-CM

## 2021-04-11 DIAGNOSIS — I10 Essential (primary) hypertension: Secondary | ICD-10-CM

## 2021-04-11 DIAGNOSIS — D72829 Elevated white blood cell count, unspecified: Secondary | ICD-10-CM | POA: Diagnosis not present

## 2021-04-11 DIAGNOSIS — D649 Anemia, unspecified: Secondary | ICD-10-CM | POA: Diagnosis not present

## 2021-04-11 DIAGNOSIS — I743 Embolism and thrombosis of arteries of the lower extremities: Secondary | ICD-10-CM

## 2021-04-11 DIAGNOSIS — E119 Type 2 diabetes mellitus without complications: Secondary | ICD-10-CM

## 2021-04-11 LAB — BASIC METABOLIC PANEL
Anion gap: 10 (ref 5–15)
BUN: 32 mg/dL — ABNORMAL HIGH (ref 8–23)
CO2: 26 mmol/L (ref 22–32)
Calcium: 8.7 mg/dL — ABNORMAL LOW (ref 8.9–10.3)
Chloride: 102 mmol/L (ref 98–111)
Creatinine, Ser: 1.23 mg/dL (ref 0.61–1.24)
GFR, Estimated: >60 mL/min (ref >60)
Glucose, Bld: 113 mg/dL — ABNORMAL HIGH (ref 70–99)
Potassium: 4.3 mmol/L (ref 3.5–5.1)
Sodium: 138 mmol/L (ref 135–145)

## 2021-04-11 LAB — POCT I-STAT, CHEM 8
BUN: 30 mg/dL — ABNORMAL HIGH (ref 8–23)
BUN: 30 mg/dL — ABNORMAL HIGH (ref 8–23)
BUN: 31 mg/dL — ABNORMAL HIGH (ref 8–23)
Calcium, Ion: 1.15 mmol/L (ref 1.15–1.40)
Calcium, Ion: 1.17 mmol/L (ref 1.15–1.40)
Calcium, Ion: 1.2 mmol/L (ref 1.15–1.40)
Chloride: 101 mmol/L (ref 98–111)
Chloride: 99 mmol/L (ref 98–111)
Chloride: 99 mmol/L (ref 98–111)
Creatinine, Ser: 0.9 mg/dL (ref 0.61–1.24)
Creatinine, Ser: 0.9 mg/dL (ref 0.61–1.24)
Creatinine, Ser: 1 mg/dL (ref 0.61–1.24)
Glucose, Bld: 199 mg/dL — ABNORMAL HIGH (ref 70–99)
Glucose, Bld: 259 mg/dL — ABNORMAL HIGH (ref 70–99)
Glucose, Bld: 323 mg/dL — ABNORMAL HIGH (ref 70–99)
HCT: 33 % — ABNORMAL LOW (ref 39.0–52.0)
HCT: 33 % — ABNORMAL LOW (ref 39.0–52.0)
HCT: 35 % — ABNORMAL LOW (ref 39.0–52.0)
Hemoglobin: 11.2 g/dL — ABNORMAL LOW (ref 13.0–17.0)
Hemoglobin: 11.2 g/dL — ABNORMAL LOW (ref 13.0–17.0)
Hemoglobin: 11.9 g/dL — ABNORMAL LOW (ref 13.0–17.0)
Potassium: 3.7 mmol/L (ref 3.5–5.1)
Potassium: 3.9 mmol/L (ref 3.5–5.1)
Potassium: 4.6 mmol/L (ref 3.5–5.1)
Sodium: 137 mmol/L (ref 135–145)
Sodium: 139 mmol/L (ref 135–145)
Sodium: 141 mmol/L (ref 135–145)
TCO2: 24 mmol/L (ref 22–32)
TCO2: 26 mmol/L (ref 22–32)
TCO2: 27 mmol/L (ref 22–32)

## 2021-04-11 LAB — URINALYSIS, ROUTINE W REFLEX MICROSCOPIC
Bilirubin Urine: NEGATIVE
Glucose, UA: 50 mg/dL — AB
Ketones, ur: 5 mg/dL — AB
Nitrite: NEGATIVE
Protein, ur: 30 mg/dL — AB
Specific Gravity, Urine: 1.012 (ref 1.005–1.030)
WBC, UA: >50 WBC/hpf — ABNORMAL HIGH (ref 0–5)
pH: 5 (ref 5.0–8.0)

## 2021-04-11 LAB — CBC
HCT: 32.2 % — ABNORMAL LOW (ref 39.0–52.0)
Hemoglobin: 10.3 g/dL — ABNORMAL LOW (ref 13.0–17.0)
MCH: 29 pg (ref 26.0–34.0)
MCHC: 32 g/dL (ref 30.0–36.0)
MCV: 90.7 fL (ref 80.0–100.0)
Platelets: 149 10*3/uL — ABNORMAL LOW (ref 150–400)
RBC: 3.55 MIL/uL — ABNORMAL LOW (ref 4.22–5.81)
RDW: 13 % (ref 11.5–15.5)
WBC: 142.1 10*3/uL (ref 4.0–10.5)
nRBC: 0 % (ref 0.0–0.2)

## 2021-04-11 LAB — GLUCOSE, CAPILLARY
Glucose-Capillary: 222 mg/dL — ABNORMAL HIGH (ref 70–99)
Glucose-Capillary: 191 mg/dL — ABNORMAL HIGH (ref 70–99)
Glucose-Capillary: 214 mg/dL — ABNORMAL HIGH (ref 70–99)
Glucose-Capillary: 213 mg/dL — ABNORMAL HIGH (ref 70–99)
Glucose-Capillary: 278 mg/dL — ABNORMAL HIGH (ref 70–99)

## 2021-04-11 LAB — HEPARIN LEVEL (UNFRACTIONATED): Heparin Unfractionated: 0.84 IU/mL — ABNORMAL HIGH (ref 0.30–0.70)

## 2021-04-11 LAB — LACTIC ACID, PLASMA
Lactic Acid, Venous: 2.8 mmol/L (ref 0.5–1.9)
Lactic Acid, Venous: 2.4 mmol/L (ref 0.5–1.9)

## 2021-04-11 LAB — CK: Total CK: 257 U/L (ref 49–397)

## 2021-04-11 MED ORDER — SODIUM CHLORIDE 0.9 % IV SOLN: INTRAVENOUS | Status: DC

## 2021-04-11 MED ORDER — SUCCINYLCHOLINE CHLORIDE 200 MG/10ML IV SOSY
PREFILLED_SYRINGE | INTRAVENOUS | Status: AC
  Filled 2021-04-11: qty 20

## 2021-04-11 MED ORDER — INSULIN GLARGINE-YFGN 100 UNIT/ML ~~LOC~~ SOLN
10.0000 [IU] | Freq: Every day | SUBCUTANEOUS | Status: DC
  Administered 2021-04-11 – 2021-04-27 (×17): 10 [IU] via SUBCUTANEOUS
  Filled 2021-04-11 (×17): qty 0.1

## 2021-04-11 MED ORDER — CEFTRIAXONE SODIUM 2 G IJ SOLR
2.0000 g | INTRAMUSCULAR | Status: DC
  Administered 2021-04-11 – 2021-04-16 (×6): 2 g via INTRAVENOUS
  Filled 2021-04-11 (×8): qty 20

## 2021-04-11 MED ORDER — LACTATED RINGERS IV SOLN: INTRAVENOUS | Status: DC

## 2021-04-11 MED ORDER — HEPARIN (PORCINE) 25000 UT/250ML-% IV SOLN
500.0000 [IU]/h | INTRAVENOUS | Status: DC
  Administered 2021-04-12: 500 [IU]/h via INTRAVENOUS
  Filled 2021-04-11 (×3): qty 250

## 2021-04-11 MED ORDER — ROCURONIUM BROMIDE 10 MG/ML (PF) SYRINGE
PREFILLED_SYRINGE | INTRAVENOUS | Status: AC
  Filled 2021-04-11: qty 10

## 2021-04-11 MED ORDER — ONDANSETRON HCL 4 MG/2ML IJ SOLN
INTRAMUSCULAR | Status: AC
  Filled 2021-04-11: qty 4

## 2021-04-11 MED ORDER — EPHEDRINE 5 MG/ML INJ
INTRAVENOUS | Status: AC
  Filled 2021-04-11: qty 15

## 2021-04-11 MED ORDER — INSULIN ASPART 100 UNIT/ML IJ SOLN
0.0000 [IU] | Freq: Three times a day (TID) | INTRAMUSCULAR | Status: DC
  Administered 2021-04-11: 5 [IU] via SUBCUTANEOUS
  Administered 2021-04-11: 3 [IU] via SUBCUTANEOUS
  Administered 2021-04-11 – 2021-04-12 (×2): 5 [IU] via SUBCUTANEOUS
  Administered 2021-04-12: 3 [IU] via SUBCUTANEOUS
  Administered 2021-04-12: 8 [IU] via SUBCUTANEOUS
  Administered 2021-04-13: 5 [IU] via SUBCUTANEOUS
  Administered 2021-04-13: 2 [IU] via SUBCUTANEOUS
  Administered 2021-04-13: 3 [IU] via SUBCUTANEOUS
  Administered 2021-04-14: 2 [IU] via SUBCUTANEOUS
  Administered 2021-04-15: 8 [IU] via SUBCUTANEOUS
  Administered 2021-04-15: 3 [IU] via SUBCUTANEOUS
  Administered 2021-04-15: 5 [IU] via SUBCUTANEOUS
  Administered 2021-04-16: 2 [IU] via SUBCUTANEOUS
  Administered 2021-04-16: 3 [IU] via SUBCUTANEOUS
  Administered 2021-04-17: 8 [IU] via SUBCUTANEOUS
  Administered 2021-04-17 – 2021-04-18 (×3): 5 [IU] via SUBCUTANEOUS
  Administered 2021-04-18 – 2021-04-19 (×2): 3 [IU] via SUBCUTANEOUS
  Administered 2021-04-20: 8 [IU] via SUBCUTANEOUS
  Administered 2021-04-20 (×2): 5 [IU] via SUBCUTANEOUS
  Administered 2021-04-21 (×2): 3 [IU] via SUBCUTANEOUS
  Administered 2021-04-21 – 2021-04-22 (×2): 5 [IU] via SUBCUTANEOUS
  Administered 2021-04-22 (×2): 3 [IU] via SUBCUTANEOUS
  Administered 2021-04-23: 8 [IU] via SUBCUTANEOUS
  Administered 2021-04-23 (×2): 3 [IU] via SUBCUTANEOUS
  Administered 2021-04-24: 5 [IU] via SUBCUTANEOUS
  Administered 2021-04-24: 2 [IU] via SUBCUTANEOUS
  Administered 2021-04-24 – 2021-04-25 (×2): 3 [IU] via SUBCUTANEOUS
  Administered 2021-04-25: 8 [IU] via SUBCUTANEOUS
  Administered 2021-04-26 (×2): 2 [IU] via SUBCUTANEOUS
  Administered 2021-04-26 – 2021-04-27 (×3): 3 [IU] via SUBCUTANEOUS
  Administered 2021-04-27: 5 [IU] via SUBCUTANEOUS

## 2021-04-11 MED ORDER — HEPARIN SODIUM (PORCINE) 1000 UNIT/ML IJ SOLN
INTRAMUSCULAR | Status: AC
  Filled 2021-04-11: qty 2

## 2021-04-11 MED ORDER — LIDOCAINE 2% (20 MG/ML) 5 ML SYRINGE
INTRAMUSCULAR | Status: AC
  Filled 2021-04-11: qty 5

## 2021-04-11 MED ORDER — SODIUM CHLORIDE 0.9 % IV SOLN: 1.0000 g | INTRAVENOUS | Status: DC

## 2021-04-11 MED ORDER — CHLORHEXIDINE GLUCONATE CLOTH 2 % EX PADS
6.0000 | MEDICATED_PAD | Freq: Every day | CUTANEOUS | Status: DC
  Administered 2021-04-11 – 2021-04-27 (×15): 6 via TOPICAL

## 2021-04-11 MED ORDER — HEPARIN SODIUM (PORCINE) 1000 UNIT/ML IJ SOLN
INTRAMUSCULAR | Status: AC
  Filled 2021-04-11: qty 1

## 2021-04-11 MED ORDER — FENTANYL CITRATE (PF) 100 MCG/2ML IJ SOLN
INTRAMUSCULAR | Status: AC
  Administered 2021-04-11: 50 ug
  Filled 2021-04-11: qty 2

## 2021-04-11 MED ORDER — HEMOSTATIC AGENTS (NO CHARGE) OPTIME
TOPICAL | Status: DC | PRN
  Administered 2021-04-11 (×2): 1 via TOPICAL

## 2021-04-11 MED ORDER — POTASSIUM CHLORIDE IN NACL 20-0.9 MEQ/L-% IV SOLN
INTRAVENOUS | Status: DC
Start: 2021-04-11 — End: 2021-04-14
  Filled 2021-04-11 (×6): qty 1000

## 2021-04-11 MED ORDER — PHENYLEPHRINE 40 MCG/ML (10ML) SYRINGE FOR IV PUSH (FOR BLOOD PRESSURE SUPPORT)
PREFILLED_SYRINGE | INTRAVENOUS | Status: AC
  Filled 2021-04-11: qty 30

## 2021-04-11 NOTE — Progress Notes (Signed)
Pt pulled 3rd IV out again, IV regained heparin restarted again. Mittens and ace bandage applied.   Chrisandra Carota, RN [phone]

## 2021-04-11 NOTE — Progress Notes (Addendum)
Inpatient Diabetes Program Recommendations  AACE/ADA: New Consensus Statement on Inpatient Glycemic Control (2015)  Target Ranges:  Prepandial:   less than 140 mg/dL      Peak postprandial:   less than 180 mg/dL (1-2 hours)      Critically ill patients:  140 - 180 mg/dL   Lab Results  Component Value Date   GLUCAP 191 (H) 04/11/2021   HGBA1C 11.5 (H) 04/10/2021    Review of Glycemic Control Results for DEMARQUEZ, CIOLEK (MRN [id number]) as of 04/11/2021 11:50  Ref. Range 04/10/2021 18:43 04/11/2021 02:14 04/11/2021 06:11  Glucose-Capillary Latest Ref Range: 70 - 99 mg/dL 380 (H) 222 (H) 191 (H)   Diabetes history: Type 2 DM Outpatient Diabetes medications: Metformin 500 mg BID Current orders for Inpatient glycemic control: Novolog 0-15 units TID  Inpatient Diabetes Program Recommendations:    Consider adding Levemir 12 units QD.   Briefly Spoke with patient and patient's sister regarding outpatient diabetes management. Patient is not appropriate for education. Reviewed patient's current A1c of 11.5%. Explained what a A1c is and what it measures. Also reviewed goal A1c with patient, importance of good glucose control @ home, and blood sugar goals. Was encouraged to call patient's daughter. Will follow for plan of care.  Spoke with patient's daughter by phone to discuss patient's diabetes management.  Reviewed patient's current A1c of 11.5%. Explained what a A1c is and what it measures. Also reviewed goal A1c with patient, importance of good glucose control @ home, and blood sugar goals. Reviewed need for insulin, current dosages, impatient trends, vascular changes and commorbidities.  Daughter requested to call back on 10/19 to further discuss.   Thanks, Bronson Curb, MSN, RNC-OB Diabetes Coordinator [phone] (8a-5p)

## 2021-04-11 NOTE — Progress Notes (Signed)
IV access regained and heparin restarted.   Chrisandra Carota, RN 04/11/2021 11:26 AM

## 2021-04-11 NOTE — Progress Notes (Signed)
Pt pulled out IV, loss of access at 1041, heparin has been turned off temporarily.   Chrisandra Carota, RN 04/11/2021 10:42 AM

## 2021-04-11 NOTE — Progress Notes (Addendum)
[facility] [place] [facility]  Subjective  - Pain is tolerable   Objective (!) 156/119 95 97.9 F (36.6 C) (Oral) 15 100%  Intake/Output Summary (Last 24 hours) at 04/11/2021 0717 Last data filed at 04/11/2021 8676 Gross per 24 hour  Intake 2082.07 ml  Output 525 ml  Net 1557.07 ml    Right LE unable to fully extend Inline doppler flow peroneal Minimal active motor in toes Dressings clean and ry, no active bleeding Lungs non labored breathing Heart RRR  Assessment/Planning: POD # 1  Procedure Performed: 1.  Right lower extremity thrombectomy from below-knee popliteal and common femoral exposures 2.  Harvest of right greater saphenous vein below the knee 3.  Right common femoral to peroneal artery bypass with 6 mm ringed PTFE 4.  Right lower extremity 4 compartment fasciotomies  He has venous thrombus as well. Currently on Heparin 500 units/hr Toes dusky with minimal motor HGB 10.3 surgical blood loss anemia  EBL 300 ml Leukocytosis 142.1 work up in progress, specimens sent pending not currently on antibiotics cultures Cr 1.23, urine Op 225 ml.   Hypertensive 156/ 119 with HR 95  Roxy Horseman 04/11/2021 7:17 AM --  Laboratory Lab Results: Recent Labs    04/10/21 2307 04/11/21 0012 04/11/21 0122 04/11/21 0408  WBC 134.0*  --   --  142.1*  HGB 10.5*   < > 11.2* 10.3*  HCT 32.4*   < > 33.0* 32.2*  PLT 142*  --   --  149*   < > = values in this interval not displayed.   BMET Recent Labs    04/10/21 1841 04/10/21 2247 04/11/21 0122 04/11/21 0408  NA 137   < > 141 138  K 4.7   < > 3.7 4.3  CL 99   < > 101 102  CO2 27  --   --  26  GLUCOSE 367*   < > 199* 113*  BUN 36*   < > 30* 32*  CREATININE 1.10   < > 1.00 1.23  CALCIUM 9.3  --   --  8.7*   < > = values in this interval not displayed.    COAG No results found for: INR, PROTIME No results found for: PTT  I have independently interviewed and examined patient and agree  with PA assessment and plan above.  Dressings down tomorrow.  Lymph nodes were sent as specimen at the time of surgery.  He remains high risk for amputation during this hospitalization  Melisha Eggleton C. Donzetta Matters, MD [facility] [place] Office: [phone] Pager: [phone]

## 2021-04-11 NOTE — Anesthesia Postprocedure Evaluation (Signed)
Anesthesia Post Note  Patient: Jacqulyn Ducking  Procedure(s) Performed: THROMBECTOMY RIGHT FEMORAL ARTERY, THROMBECTOMY RIGHT LOWER EXTREMITY (Right: Leg Lower) BYPASS GRAFT RIGHT FEMORAL-POPLITEAL ARTERY USING PROPATEN GRAFT (Right: Leg Upper) FASCIOTOMY RIGHT LOWER EXTREMITY (Right: Leg Lower)     Patient location during evaluation: PACU Anesthesia Type: General Level of consciousness: sedated Pain management: pain level controlled Vital Signs Assessment: post-procedure vital signs reviewed and stable Respiratory status: spontaneous breathing and respiratory function stable Cardiovascular status: stable Postop Assessment: no apparent nausea or vomiting Anesthetic complications: no   No notable events documented.  Last Vitals:  Vitals:   04/11/21 0315 04/11/21 0406  BP: 139/82 (!) 156/119  Pulse: 92 95  Resp: 15 15  Temp: (!) 36.1 C 36.6 C  SpO2: 98% 100%    Last Pain:  Vitals:   04/11/21 0406  TempSrc: Oral  PainSc: 0-No pain                 Jyren Cerasoli DANIEL

## 2021-04-11 NOTE — Progress Notes (Addendum)
PROGRESS NOTE  Scott Garrett ZTI:[id number] DOB: Nov 24, 1945 DOA: 04/10/2021 PCP: [name]  HPI/Recap of past 24 hours: Scott Garrett is a 75 y.o. male with medical history significant of dementia, DM2, HTN, presents from home where he lives alone and has a caregiver comes in 3 times a week.  Disoriented at baseline.  Presented due to confusion and right foot pain.  Work-up revealed right lower extremity critical limb ischemia.  Also incidental findings of leukocytosis WBC greater than 140 and absolute lymphocytosis.  Seen by vascular surgery, was taken to the OR emergently on 04/10/2021 by vascular surgery for Right lower extremity revascularization with fasciotomies.  Medical oncology has been consulted due to leukocytosis/lymphocytosis.  04/11/2021: Patient was seen and examined at his bedside.  He denies having any significant pain in his right lower extremity while laying in bed.  His urine analysis came back positive for pyuria was started on Rocephin on 04/11/2021.  Assessment/Plan: Principal Problem:   Critical limb ischemia of right lower extremity (HCC) Active Problems:   Dementia (HCC)   DM2 (diabetes mellitus, type 2) (HCC)   HTN (hypertension)   Leukocytosis  Right lower extremity critical limb ischemia status post revascularization and fasciotomies on 04/10/2021 by vascular surgery [name]. He is currently on heparin drip, continue. Continue IV fluid hydration. Rest of management per vascular surgery. Pain control as needed.  Bowel regimen as needed  Leukocytosis/absolute lymphocytosis, unclear etiology. WBC greater than 140K. Peripheral smear ordered, medical oncology consulted to assist with the management. Management per medical oncology.  Presumptive UTI Urine analysis positive for pyuria Follow urine culture Started on Rocephin 2 g daily empirically.  Acute metabolic encephalopathy in the setting of dementia likely secondary to UTI acute  illness. Reorient as needed Treat underlying conditions Fall precautions  Chronic normocytic anemia Appears to be at his baseline hemoglobin 10.3, MCV 90 No overt bleeding Continue to closely monitor.  Type 2 diabetes with hyperglycemia Hemoglobin A1c 11.5 on 04/10/2021 Continue to hold off home oral hypoglycemics Started Semglee 10 unit daily Continue insulin sliding scale.  Lactic acidosis Lactic acid 2.8, repeat 2.4. Continue IV fluid  Mild thrombocytopenia Platelet count 149 Monitor for now No overt bleeding.  Dementia with mild behavioral disturbances Bilateral mittens in place Has pulled out 3 IVs Reorient as needed  Critical care time: 65 minutes   Code Status: Full code  Family Communication: None at bedside.  Disposition Plan: Likely will discharge to home with home health services once vascular surgery signs off.   Consultants: Vascular surgery  Procedures: Revascularization, fasciotomies on 04/10/2021 by vascular surgery [name].  Antimicrobials: Rocephin 2 g daily, 04/11/2021.  DVT prophylaxis: Heparin drip.  Status is: Inpatient  Patient will require at least 2 midnights for further evaluation and treatment of present condition.        Objective: Vitals:   04/11/21 0315 04/11/21 0406 04/11/21 0740 04/11/21 1100  BP: 139/82 (!) 156/119 124/73 117/80  Pulse: 92 95 98 100  Resp: 15 15 16 19   Temp: (!) 97 F (36.1 C) 97.9 F (36.6 C) 97.8 F (36.6 C) 97.8 F (36.6 C)  TempSrc:  Oral Oral Oral  SpO2: 98% 100% 100% 100%  Weight:        Intake/Output Summary (Last 24 hours) at 04/11/2021 1447 Last data filed at 04/11/2021 0629 Gross per 24 hour  Intake 2082.07 ml  Output 525 ml  Net 1557.07 ml   Filed Weights   04/10/21 1930  Weight: 81.6 kg  Exam:  General: 75 y.o. year-old male well developed well nourished in no acute distress.  Alert and interactive, confused. Cardiovascular: Regular rate and rhythm with no rubs  or gallops.  No thyromegaly or JVD noted.   Respiratory: Clear to auscultation with no wheezes or rales. Good inspiratory effort. Abdomen: Soft nontender nondistended with normal bowel sounds x4 quadrants. Musculoskeletal: Right lower extremity in surgical dressing.  Trace edema in lower extremities bilaterally.   Skin: No ulcerative lesions noted or rashes. Psychiatry: Mood is appropriate for condition and setting   Data Reviewed: CBC: Recent Labs  Lab 04/10/21 1841 04/10/21 2130 04/10/21 2247 04/10/21 2307 04/11/21 0012 04/11/21 0122 04/11/21 0408  WBC 134.8* 132.2*  --  134.0*  --   --  142.1*  NEUTROABS  --  15.9*  --   --   --   --   --   HGB 12.4* 11.0* 11.9* 10.5* 11.2* 11.2* 10.3*  HCT 38.7* 33.7* 35.0* 32.4* 33.0* 33.0* 32.2*  MCV 91.3 90.3  --  91.3  --   --  90.7  PLT 169 158  --  142*  --   --  025*   Basic Metabolic Panel: Recent Labs  Lab 04/10/21 1841 04/10/21 2247 04/11/21 0012 04/11/21 0122 04/11/21 0408  NA 137 137 139 141 138  K 4.7 4.6 3.9 3.7 4.3  CL 99 99 99 101 102  CO2 27  --   --   --  26  GLUCOSE 367* 323* 259* 199* 113*  BUN 36* 30* 31* 30* 32*  CREATININE 1.10 0.90 0.90 1.00 1.23  CALCIUM 9.3  --   --   --  8.7*   GFR: CrCl cannot be calculated (Unknown ideal weight.). Liver Function Tests: No results for input(s): AST, ALT, ALKPHOS, BILITOT, PROT, ALBUMIN in the last 168 hours. No results for input(s): LIPASE, AMYLASE in the last 168 hours. No results for input(s): AMMONIA in the last 168 hours. Coagulation Profile: No results for input(s): INR, PROTIME in the last 168 hours. Cardiac Enzymes: Recent Labs  Lab 04/10/21 1940 04/11/21 0408  CKTOTAL 266 257   BNP (last 3 results) No results for input(s): PROBNP in the last 8760 hours. HbA1C: Recent Labs    04/10/21 2307  HGBA1C 11.5*   CBG: Recent Labs  Lab 04/10/21 1843 04/11/21 0214 04/11/21 0611 04/11/21 1312  GLUCAP 380* 222* 191* 214*   Lipid Profile: No results  for input(s): CHOL, HDL, LDLCALC, TRIG, CHOLHDL, LDLDIRECT in the last 72 hours. Thyroid Function Tests: No results for input(s): TSH, T4TOTAL, FREET4, T3FREE, THYROIDAB in the last 72 hours. Anemia Panel: No results for input(s): VITAMINB12, FOLATE, FERRITIN, TIBC, IRON, RETICCTPCT in the last 72 hours. Urine analysis:    Component Value Date/Time   COLORURINE YELLOW 04/11/2021 0328   APPEARANCEUR CLOUDY (A) 04/11/2021 0328   LABSPEC 1.012 04/11/2021 0328   PHURINE 5.0 04/11/2021 0328   GLUCOSEU 50 (A) 04/11/2021 0328   HGBUR MODERATE (A) 04/11/2021 0328   BILIRUBINUR NEGATIVE 04/11/2021 0328   KETONESUR 5 (A) 04/11/2021 0328   PROTEINUR 30 (A) 04/11/2021 0328   NITRITE NEGATIVE 04/11/2021 0328   LEUKOCYTESUR LARGE (A) 04/11/2021 0328   Sepsis Labs: @LABRCNTIP (procalcitonin:4,lacticidven:4)  ) Recent Results (from the past 240 hour(s))  Resp Panel by RT-PCR (Flu A&B, Covid) Nasopharyngeal Swab     Status: None   Collection Time: 04/10/21  6:42 PM   Specimen: Nasopharyngeal Swab; Nasopharyngeal(NP) swabs in vial transport medium  Result Value Ref Range Status  SARS Coronavirus 2 by RT PCR NEGATIVE NEGATIVE Final    Comment: (NOTE) SARS-CoV-2 target nucleic acids are NOT DETECTED.  The SARS-CoV-2 RNA is generally detectable in upper respiratory specimens during the acute phase of infection. The lowest concentration of SARS-CoV-2 viral copies this assay can detect is 138 copies/mL. A negative result does not preclude SARS-Cov-2 infection and should not be used as the sole basis for treatment or other patient management decisions. A negative result may occur with  improper specimen collection/handling, submission of specimen other than nasopharyngeal swab, presence of viral mutation(s) within the areas targeted by this assay, and inadequate number of viral copies(<138 copies/mL). A negative result must be combined with clinical observations, patient history, and  epidemiological information. The expected result is Negative.  Fact Sheet for Patients:  EntrepreneurPulse.com.au  Fact Sheet for Healthcare Providers:  IncredibleEmployment.be  This test is no t yet approved or cleared by the [place] FDA and  has been authorized for detection and/or diagnosis of SARS-CoV-2 by FDA under an Emergency Use Authorization (EUA). This EUA will remain  in effect (meaning this test can be used) for the duration of the COVID-19 declaration under Section 564(b)(1) of the Act, 21 U.S.C.section 360bbb-3(b)(1), unless the authorization is terminated  or revoked sooner.       Influenza A by PCR NEGATIVE NEGATIVE Final   Influenza B by PCR NEGATIVE NEGATIVE Final    Comment: (NOTE) The Xpert Xpress SARS-CoV-2/FLU/RSV plus assay is intended as an aid in the diagnosis of influenza from Nasopharyngeal swab specimens and should not be used as a sole basis for treatment. Nasal washings and aspirates are unacceptable for Xpert Xpress SARS-CoV-2/FLU/RSV testing.  Fact Sheet for Patients: EntrepreneurPulse.com.au  Fact Sheet for Healthcare Providers: IncredibleEmployment.be  This test is not yet approved or cleared by the [place] FDA and has been authorized for detection and/or diagnosis of SARS-CoV-2 by FDA under an Emergency Use Authorization (EUA). This EUA will remain in effect (meaning this test can be used) for the duration of the COVID-19 declaration under Section 564(b)(1) of the Act, 21 U.S.C. section 360bbb-3(b)(1), unless the authorization is terminated or revoked.  Performed at [facility], [place] [address], [place], [place] [address]   Culture, blood (routine x 2)     Status: None (Preliminary result)   Collection Time: 04/10/21  8:55 PM   Specimen: BLOOD LEFT HAND  Result Value Ref Range Status   Specimen Description BLOOD LEFT HAND  Final    Special Requests   Final    BOTTLES DRAWN AEROBIC AND ANAEROBIC Blood Culture results may not be optimal due to an inadequate volume of blood received in culture bottles   Culture   Final    NO GROWTH < 12 HOURS Performed at [facility], [place] [address], [place], [place] [address]    Report Status PENDING  Incomplete  Culture, blood (routine x 2)     Status: None (Preliminary result)   Collection Time: 04/10/21  9:40 PM   Specimen: BLOOD LEFT HAND  Result Value Ref Range Status   Specimen Description BLOOD LEFT HAND  Final   Special Requests   Final    BOTTLES DRAWN AEROBIC AND ANAEROBIC Blood Culture results may not be optimal due to an inadequate volume of blood received in culture bottles   Culture   Final    NO GROWTH < 12 HOURS Performed at [facility], [place] [address] [place], [place] [address]  Report Status PENDING  Incomplete      Studies: DG Chest Port 1 View  Result Date: 04/10/2021 CLINICAL DATA:  Weakness EXAM: PORTABLE CHEST 1 VIEW COMPARISON:  None. FINDINGS: Heart and mediastinal contours are within normal limits. No focal opacities or effusions. No acute bony abnormality. IMPRESSION: No active disease. Electronically Signed   By: Rolm Baptise M.D.   On: 04/10/2021 20:31    Scheduled Meds:  Chlorhexidine Gluconate Cloth  6 each Topical Daily   insulin aspart  0-15 Units Subcutaneous TID WC   insulin glargine-yfgn  10 Units Subcutaneous Daily    Continuous Infusions:  cefTRIAXone (ROCEPHIN)  IV 2 g (04/11/21 1117)   heparin 500 Units/hr (04/11/21 0222)   lactated ringers 125 mL/hr at 04/11/21 0629     LOS: 1 day     Kayleen Memos, MD [facility] Pager [phone]  If 7PM-7AM, please contact night-coverage [email or web address] Password [facility] 04/11/2021, 2:47 PM

## 2021-04-11 NOTE — Op Note (Signed)
Patient name: Scott Garrett MRN: [id number] DOB: 06-12-1946 Sex: male  04/11/2021 Pre-operative Diagnosis: Acute right lower extremity ischemia Post-operative diagnosis:  Same Surgeon:  Erlene Quan C. Donzetta Matters, MD Assistant: Laurence Slate, PA Procedure Performed: 1.  Right lower extremity thrombectomy from below-knee popliteal and common femoral exposures 2.  Harvest of right greater saphenous vein below the knee 3.  Right common femoral to peroneal artery bypass with 6 mm ringed PTFE 4.  Right lower extremity 4 compartment fasciotomies  Indications: 75 year old male who was found down for unspecified period of time.  Upon arrival he was found to have leukocytosis greater than 130,000 without signals in the right foot with dusky appearance to the right foot and no sensation or motor beyond the mid calf.  He is indicated for right lower extremity thrombectomy possible bypass and fasciotomies.  An assistant was necessary to facilitate exposure and expedite the case.  Findings: The below-knee popliteal artery as well as the common femoral artery was densely adherent to the surrounding tissues.  There was thrombosis of the popliteal vein as well as the femoral vein leading into the common femoral vein.  There were significant lymph nodes identified in the right groin that were sent as specimen.  Saphenous vein was thrombosed below the knee after harvesting it was not suitable for bypass.  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  We are able to thrombectomize the peroneal artery and establish backbleeding and then bypass from the common femoral artery to the peroneal artery and an end to end fashion distally.  At completion there was good signal within the wound bed and the peroneal artery I could not identify any signal at the foot there is no role for further revascularization so I elected to perform fasciotomies and warm the foot up to allow  demarcation.   Procedure:  The patient was identified in the holding area and taken to the operating recently supine operative when general anesthesia was induced.  He was sterilely prepped and draped in the right lower extremity in usual fashion, antibiotics were administered and timeout was called.  Patient was already on heparin this was stopped.  We made a vertical incision below the knee.  We preserve the greater saphenous vein although this did appear somewhat sclerotic.  We dissected through the deep fascia.  We identified the tibial nerve followed by the popliteal vein.  This was quite large did appear thrombosed.  We divided 1 branch and obvious thrombus within it.  It was densely adherent to the neighboring popliteal artery which had no pulse and was significantly calcified.  We dissected down and divided the anterior tibial vein placed Vesseloops around the anterior tibial artery.  The tibioperoneal trunk was heavily calcified we dissected down to the bifurcation placed Vesseloops around the posterior tibial and peroneal arteries.  I began to suspect that possibly there was a thrombosed popliteal aneurysm.  I made a vertical incision above the knee and dissected down and identified the sciatic nerve.  We then identified the above-knee popliteal vein and artery.  This was also thrombosed and densely adherent to the surrounding tissues.  There was no pulse there there was no identifiable aneurysm I elected that I could not bypass from there.  I also identified the saphenous vein within this wound.  After my attention the groin where a vertical incision was made.  Dissected down to the densely here common femoral artery.  There was obvious  thrombus within the femoral vein leading into the common femoral vein as this was over a centimeter large and significantly dilated and tense.  We dissected out the profunda and the SFA.  Patient was given additional heparin at this time.  Transverse arteriotomy was made  we clamped the inflow.  We did have some backbleeding from the profunda we passed a 4 Fogarty no clot returned.  I then passed 3 and 4 Fogarty's down the SFA but could only get 25 cm with both.  I closed the arteriotomy.  Return my attention distally below the knee.  We made a vertical arteriotomy down the tibioperoneal trunk down to the bifurcation.  Posterior tibial artery was chronically occluded.  I passed the 3 Fogarty 28 cm down the peroneal artery and return clot.  I passed again no clot returned I did have good backbleeding and this was clamped distally with a bulldog type clamp.  I then dissected up to the anterior tibial artery this was chronically occluded.  I could not pass any Fogarty's proximally greater than 8 cm.  With this I then oversewed the distal popliteal artery at the anterior tibial artery stump.  I elected for bypass to the peroneal artery.  I began harvesting the greater saphenous vein.  Became quite clear that this was sclerotic and occluded.  As I would not have another vein I did ligate and harvested case and needed a patch but it was thrombosed could not be used for bypass.  I then brought a 6 mm ringed PTFE to the table I tunneled this anatomically.  We clamped the common femoral artery inflow and outflow and opened vertically.  Trimmed the graft to size and sewn end-to-side with 6-0 Prolene suture.  Flushed through the graft.  We had very strong blood flow distally.  We clamped the graft and femoral incision flushed with heparinized saline.  During my attention to distally and spatulated the peroneal artery.  I straighten the leg and trimmed the graft to size and sewed into 2 and 6 mm ringed PTFE to the peroneal artery.  Prior completion of flushing all directions.  Upon completion there is very strong pulse distally in the peroneal artery within the wound.  We confirmed this with Doppler.  I did not have a signal at the ankle the foot was quite rigid.  We then completed a 4  compartment fasciotomy by extending her medial incision opening the deep fascia distally leaving the soleal bridge intact.  Laterally a vertical incision was made we dissected down open both anterior lateral fascia was.  We then irrigated all of her wounds.  The groin and the above-knee popliteal incision were closed with deep Vicryl suture and staples at the skin.  I was able to close some tissue over the graft below the knee.  We then fashioned wet-to-dry dressings medially and laterally and in the wounds.  He was then awakened from anesthesia having tolerated procedure without any complication.  All counts were correct at completion.  EBL: 300cc   Scott Hayhurst C. Donzetta Matters, MD [facility] Office: [phone] Pager: [phone]

## 2021-04-11 NOTE — Consult Note (Addendum)
[place]  Telephone:([phone]) [phone] Fax:(336) [phone]   MEDICAL ONCOLOGY - INITIAL CONSULTATION  Referral MD: Dr. Irene Pap  Reason for Referral: Leukocytosis, anemia, mild thrombocytopenia  HPI: Scott Garrett is a 75 year old male with a past medical history significant for dementia, type 2 diabetes mellitus, and hypertension.  The patient is disoriented at baseline.  Reportedly, the patient lives alone but has caregivers that come in 3 times per week.  The patient was brought in on a Monday and the caregiver last saw him on a Wednesday.  A caregiver did come to his house on Friday but the patient told her not to come in and she did not.  On the day of admission, the patient's daughter went to check on him and had to unlock the door because he would not let her in.  He was found sitting on the floor for an unknown period of time.  He was noted to have a discolored right foot which was painful to touch.  In the emergency department, he was seen by vascular surgery who recommended urgent revascularization to attempt limb salvage.  He was taken to the OR early in the morning of 04/11/2021 and underwent 1.  Right lower extremity thrombectomy from below-knee popliteal and common femoral exposures. 2.  Harvest of right greater saphenous vein below the knee. 3.  Right common femoral to peroneal artery bypass with 6 mm ringed PTFE. 4.  Right lower extremity 4 compartment  fasciotomies.   Initial lab work performed on the patient showed a WBC of 134.8, hemoglobin 12.4, and platelet count 169,000.  A differential was obtained on a repeat CBC which showed an absolute neutrophil count of 15.9, absolute lymphocyte count of 112.4, and absolute monocyte count of 4.0. Technologist review of the peripheral blood smear showed absolute lymphocytosis.  Pathologist review of peripheral blood smear is in process.  Most recent CBC available to me through care everywhere was dated 09/21/2015 and his WBC at that  time was 6.83, hemoglobin 11.6, and platelets 222,000.  The patient was laying in bed with hand mitts on.  His sister was at the bedside.  He is mildly confused.  His sister did help with some of the history.  His sister indicates that they were unaware of any abnormalities in his CBC.  He has not required any transfusions in the past.  He he is not currently complaining of any pain.  He is not having any fevers, chills, chest pain, shortness of breath.  He denies abdominal pain, nausea, vomiting.  No bleeding reported.  The patient is divorced.  He has 1 daughter.  He currently lives alone but has a caregiver that comes in to check on him.  He smokes 1 pack or more of cigarettes daily.  He also drinks alcohol routinely-family member states that he does not drink daily but could not quantify his alcohol use further for me.  The patient was in the [employer] and served in [place] just after high school.  He was exposed to agent orange.  Family history significant for a mother with lung cancer.  No family history of blood disorders, leukemias, lymphomas, or myeloma's.  Hematology was asked to see the patient to make recommendations regarding his leukocytosis.  Past Medical History:  Diagnosis Date   Colon polyps    Dementia ([place])    Diabetic retinopathy ([place])    DM2 (diabetes mellitus, type 2) ([place])    HTN (hypertension)   :.   Right thumb osteomyelitis  2017   Past Surgical History:  Procedure Laterality Date   COLONOSCOPY     FASCIOTOMY Right 04/10/2021   Procedure: FASCIOTOMY RIGHT LOWER EXTREMITY;  Surgeon: Waynetta Sandy, MD;  Location: [facility];  Service: Vascular;  Laterality: Right;   FEMORAL-POPLITEAL BYPASS GRAFT Right 04/10/2021   Procedure: BYPASS GRAFT RIGHT FEMORAL-POPLITEAL ARTERY USING PROPATEN GRAFT;  Surgeon: Waynetta Sandy, MD;  Location: [facility] OR;  Service: Vascular;  Laterality: Right;   THROMBECTOMY FEMORAL ARTERY Right 04/10/2021   Procedure: THROMBECTOMY RIGHT  FEMORAL ARTERY, THROMBECTOMY RIGHT LOWER EXTREMITY;  Surgeon: Waynetta Sandy, MD;  Location: [facility];  Service: Vascular;  Laterality: Right;  :   Current Facility-Administered Medications  Medication Dose Route Frequency Provider Last Rate Last Admin   acetaminophen (TYLENOL) tablet 650 mg  650 mg Oral Q6H PRN Laurence Slate M, PA-C       Or   acetaminophen (TYLENOL) suppository 650 mg  650 mg Rectal Q6H PRN Laurence Slate M, PA-C       cefTRIAXone (ROCEPHIN) 2 g in sodium chloride 0.9 % 100 mL IVPB  2 g Intravenous Q24H Irene Pap N, DO 200 mL/hr at 04/11/21 1117 2 g at 04/11/21 1117   Chlorhexidine Gluconate Cloth 2 % PADS 6 each  6 each Topical Daily Irene Pap N, DO   6 each at 04/11/21 1051   heparin ADULT infusion 100 units/mL (25000 units/241m)  500 Units/hr Intravenous Continuous CLaurence SlateM, PA-C 5 mL/hr at 04/11/21 0222 500 Units/hr at 04/11/21 0222   HYDROmorphone (DILAUDID) injection 0.5-1 mg  0.5-1 mg Intravenous Q2H PRN CLaurence SlateM, PA-C       insulin aspart (novoLOG) injection 0-15 Units  0-15 Units Subcutaneous TID WC GEtta Quill DO   5 Units at 04/11/21 1314   lactated ringers infusion   Intravenous Continuous CUlyses Amor PA-C 125 mL/hr at 04/11/21 08270Infusion Verify at 04/11/21 0629   ondansetron (ZOFRAN) tablet 4 mg  4 mg Oral Q6H PRN CLaurence SlateM, PA-C       Or   ondansetron ([facility] injection 4 mg  4 mg Intravenous Q6H PRN CLaurence SlateM, PA-C         No Known Allergies:   Family History  Family history unknown: Yes  :   Social History   Socioeconomic History   Marital status: Unknown    Spouse name: Not on file   Number of children: Not on file   Years of education: Not on file   Highest education level: Not on file  Occupational History   Not on file  Tobacco Use   Smoking status: Every Day    Types: Cigarettes   Smokeless tobacco: Not on file  Substance and Sexual Activity   Alcohol use: Not on file   Drug use:  Not on file   Sexual activity: Not on file  Other Topics Concern   Not on file  Social History Narrative   Not on file   Social Determinants of Health   Financial Resource Strain: Not on file  Food Insecurity: Not on file  Transportation Needs: Not on file  Physical Activity: Not on file  Stress: Not on file  Social Connections: Not on file  Intimate Partner Violence: Not on file  :  Review of Systems: A comprehensive 14 point review of systems was negative except as noted in the HPI.  Exam: Patient Vitals for the past 24 hrs:  BP Temp Temp src Pulse Resp SpO2  Weight  04/11/21 1100 117/80 97.8 F (36.6 C) Oral 100 19 100 % --  04/11/21 0740 124/73 97.8 F (36.6 C) Oral 98 16 100 % --  04/11/21 0406 (!) 156/119 97.9 F (36.6 C) Oral 95 15 100 % --  04/11/21 0315 139/82 (!) 97 F (36.1 C) -- 92 15 98 % --  04/11/21 0300 133/74 -- -- 90 15 96 % --  04/11/21 0245 114/77 -- -- 87 17 95 % --  04/11/21 0230 (!) 111/92 -- -- 84 16 100 % --  04/11/21 0215 125/74 (!) 97.5 F (36.4 C) -- 84 14 100 % --  04/10/21 1930 (!) 132/116 -- -- 84 18 99 % 81.6 kg  04/10/21 1845 (!) 146/101 -- -- (!) 104 16 92 % --  04/10/21 1840 (!) 149/84 98.4 F (36.9 C) Oral (!) 102 15 97 % --    General:  well-nourished in no acute distress.   Eyes:  no scleral icterus.   ENT:  There were no oropharyngeal lesions.   Neck was without thyromegaly.   Lymphatics:  Negative cervical, supraclavicular or axillary adenopathy.   Respiratory: lungs were clear bilaterally without wheezing or crackles.   Cardiovascular:  Regular rate and rhythm, S1/S2, without murmur, rub or gallop.  There was no pedal edema.   GI:  abdomen was soft, flat, nontender, nondistended, without organomegaly.   Musculoskeletal:  no spinal tenderness of palpation of vertebral spine.   Skin exam was without echymosis, petichae.   Neuro exam was nonfocal. Patient was alert and oriented.  Attention was good.   Language was appropriate.   Mood was normal without depression.  Speech was not pressured.  Thought content was not tangential.     Lab Results  Component Value Date   WBC 142.1 (HH) 04/11/2021   HGB 10.3 (L) 04/11/2021   HCT 32.2 (L) 04/11/2021   PLT 149 (L) 04/11/2021   GLUCOSE 113 (H) 04/11/2021   NA 138 04/11/2021   K 4.3 04/11/2021   CL 102 04/11/2021   CREATININE 1.23 04/11/2021   BUN 32 (H) 04/11/2021   CO2 26 04/11/2021  Peripheral blood smear 04/10/2021: The platelets are normal in number, no platelet clumps.  Burr cells, few ovalocytes, rare teardrop and helmet/schistocytes.  Marked increase in mature appearing mononuclear cells with scant cytoplasm.  Some of the cells have a single nucleolus and others have a cleaved nucleus.  No blast.  Rare myelocyte and promyelocyte.  DG Chest Port 1 View  Result Date: 04/10/2021 CLINICAL DATA:  Weakness EXAM: PORTABLE CHEST 1 VIEW COMPARISON:  None. FINDINGS: Heart and mediastinal contours are within normal limits. No focal opacities or effusions. No acute bony abnormality. IMPRESSION: No active disease. Electronically Signed   By: Rolm Baptise M.D.   On: 04/10/2021 20:31     DG Chest Port 1 View  Result Date: 04/10/2021 CLINICAL DATA:  Weakness EXAM: PORTABLE CHEST 1 VIEW COMPARISON:  None. FINDINGS: Heart and mediastinal contours are within normal limits. No focal opacities or effusions. No acute bony abnormality. IMPRESSION: No active disease. Electronically Signed   By: Rolm Baptise M.D.   On: 04/10/2021 20:31    Assessment and Plan:  1.  Leukocytosis/lymphocytosis 2.  Normocytic anemia 3.  Mild thrombocytopenia 4.  Critical limb ischemia of the right lower extremity status post revascularization 04/10/2021 5.  Dementia 6.  Diabetes mellitus 7.  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  Tobacco dependence 10.  Alcohol use  Mr. Lucarelli was admitted  to the hospital with critical limb ischemia.  He was taken to the OR overnight for revascularization.  He  was also noted to have venous thrombus as well and is on heparin at this time.  Vascular surgery is currently following closely.  The patient was noted to have significant leukocytosis and leukocytosis on admission.  Per family and prior lab work from 2017, this is a new finding.  I explained to the patient's family member that we will review a peripheral blood smear from today and then make additional recommendations.  I briefly discussed the possibility of having to pursue a bone marrow biopsy depending on peripheral blood smear findings.  We will have additional discussions with the patient and his family regarding additional later today.  Recommendations: Will review peripheral blood smear.  Patient may need a bone marrow biopsy depending on findings. Continue management critical limb ischemia per vascular surgery.  Thank you for this referral.   Mikey Bussing, DNP, AGPCNP-BC, AOCNP   Mr. Sippel was interviewed and examined.  I reviewed the peripheral blood smear.  I discussed the hematologic findings with his sister at the bedside and his daughter by telephone.  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  I suspect the CLL is unrelated to the right lower extremity ischemia.  We will confirm the diagnosis of CLL with peripheral blood flow cytometry and review of the lymph node pathology.  I see no clear indication for treating the hematopoietic malignancy at present.  Mr. Hendershott appears to have significant dementia which will likely impact on treatment options.  I will continue following him in the hospital.  Outpatient follow-up will be scheduled at the Cancer center.  I was present for greater than 50% of today's visit.  I performed medical decision making.  Julieanne Manson, MD

## 2021-04-11 NOTE — Progress Notes (Signed)
VASCULAR SURGERY:  Called to see patient because they took the dressing down and there was no doppler flow in the right foot. He presented with acute on chronic limb ischemia and his only option for attempted revascularization was a fem-per with PTFE (vein was thrombosed and not useable). There was a signal in the wound bed only at the completion with no signal in the foot. This AM there was some flow in the peroneal artery with the doppler.   There are several complicating issues. He has dementia. It was not clear how long the right LE was ischemic prior to presentation. Dr. Benay Spice suspects that he has CLL. His urine output has dropped off. It was only 225 cc for 12 hours prior shift.   On exam, the right foot is cold and the toes have early gangrene. The fasciotomy sites were inspected and even the muscle in the proximal, medial wound looks ischemic. There is no peroneal signal with the doppler.   Impression: The right fem-per is occluded despite IV heparin. I have had a long discussion with his daughter at the bedside, [place]. I have explained that even with attempted thrombectomy of the graft. I think there is little chance that the foot is salvageable at this point. But, this would be are only remaining option and then restart heparin at a higher dose. [name] spoke to 2 other family members and they have decided not to proceed with attempted thrombectomy of the graft. Given the ischemic changes noted in the proximal fasciotomy site, I feel that he will likely need an AKA.   Gae Gallop, MD 9:55 PM

## 2021-04-11 NOTE — Transfer of Care (Signed)
Immediate Anesthesia Transfer of Care Note  Patient: Scott Garrett  Procedure(s) Performed: THROMBECTOMY RIGHT FEMORAL ARTERY, THROMBECTOMY RIGHT LOWER EXTREMITY (Right: Leg Lower) BYPASS GRAFT RIGHT FEMORAL-POPLITEAL ARTERY USING PROPATEN GRAFT (Right: Leg Upper) FASCIOTOMY RIGHT LOWER EXTREMITY (Right: Leg Lower)  Patient Location: PACU  Anesthesia Type:General  Level of Consciousness: drowsy  Airway & Oxygen Therapy: Patient Spontanous Breathing and Patient connected to face mask oxygen  Post-op Assessment: Report given to RN and Post -op Vital signs reviewed and stable  Post vital signs: Reviewed and stable  Last Vitals:  Vitals Value Taken Time  BP 125/74 04/11/21 0214  Temp    Pulse 84 04/11/21 0214  Resp 14 04/11/21 0214  SpO2 100 % 04/11/21 0214  Vitals shown include unvalidated device data.  Last Pain:  Vitals:   04/10/21 2031  TempSrc:   PainSc: 6          Complications: No notable events documented.

## 2021-04-11 NOTE — Progress Notes (Signed)
Per Dr Tobias Alexander ok to stop insulin drip and defer to hospitalist for management. Dr Alcario Drought paged and notified. States he will be placing sliding scale orders

## 2021-04-12 DIAGNOSIS — D72829 Elevated white blood cell count, unspecified: Secondary | ICD-10-CM | POA: Diagnosis not present

## 2021-04-12 DIAGNOSIS — Z95828 Presence of other vascular implants and grafts: Secondary | ICD-10-CM

## 2021-04-12 DIAGNOSIS — I70221 Atherosclerosis of native arteries of extremities with rest pain, right leg: Secondary | ICD-10-CM | POA: Diagnosis not present

## 2021-04-12 DIAGNOSIS — D649 Anemia, unspecified: Secondary | ICD-10-CM | POA: Diagnosis not present

## 2021-04-12 DIAGNOSIS — D696 Thrombocytopenia, unspecified: Secondary | ICD-10-CM | POA: Diagnosis not present

## 2021-04-12 LAB — HEMOGLOBIN AND HEMATOCRIT, BLOOD
HCT: 22.1 % — ABNORMAL LOW (ref 39.0–52.0)
Hemoglobin: 7.2 g/dL — ABNORMAL LOW (ref 13.0–17.0)

## 2021-04-12 LAB — BASIC METABOLIC PANEL
Anion gap: 9 (ref 5–15)
BUN: 38 mg/dL — ABNORMAL HIGH (ref 8–23)
CO2: 23 mmol/L (ref 22–32)
Calcium: 7.7 mg/dL — ABNORMAL LOW (ref 8.9–10.3)
Chloride: 98 mmol/L (ref 98–111)
Creatinine, Ser: 1.69 mg/dL — ABNORMAL HIGH (ref 0.61–1.24)
GFR, Estimated: 42 mL/min — ABNORMAL LOW (ref >60)
Glucose, Bld: 247 mg/dL — ABNORMAL HIGH (ref 70–99)
Potassium: 4.4 mmol/L (ref 3.5–5.1)
Sodium: 130 mmol/L — ABNORMAL LOW (ref 135–145)

## 2021-04-12 LAB — PREPARE RBC (CROSSMATCH)

## 2021-04-12 LAB — GLUCOSE, CAPILLARY
Glucose-Capillary: 245 mg/dL — ABNORMAL HIGH (ref 70–99)
Glucose-Capillary: 184 mg/dL — ABNORMAL HIGH (ref 70–99)
Glucose-Capillary: 259 mg/dL — ABNORMAL HIGH (ref 70–99)
Glucose-Capillary: 145 mg/dL — ABNORMAL HIGH (ref 70–99)

## 2021-04-12 LAB — CBC
HCT: 22.1 % — ABNORMAL LOW (ref 39.0–52.0)
Hemoglobin: 7.3 g/dL — ABNORMAL LOW (ref 13.0–17.0)
MCH: 29.3 pg (ref 26.0–34.0)
MCHC: 33 g/dL (ref 30.0–36.0)
MCV: 88.8 fL (ref 80.0–100.0)
Platelets: 141 10*3/uL — ABNORMAL LOW (ref 150–400)
RBC: 2.49 MIL/uL — ABNORMAL LOW (ref 4.22–5.81)
RDW: 13 % (ref 11.5–15.5)
WBC: 142.3 10*3/uL (ref 4.0–10.5)
nRBC: 0 % (ref 0.0–0.2)

## 2021-04-12 LAB — URINE CULTURE: Culture: NO GROWTH

## 2021-04-12 LAB — PATHOLOGIST SMEAR REVIEW

## 2021-04-12 LAB — HEPARIN LEVEL (UNFRACTIONATED): Heparin Unfractionated: <0.1 IU/mL — ABNORMAL LOW (ref 0.30–0.70)

## 2021-04-12 LAB — ABO/RH: ABO/RH(D): A POS

## 2021-04-12 LAB — PHOSPHORUS: Phosphorus: 2.5 mg/dL (ref 2.5–4.6)

## 2021-04-12 LAB — MAGNESIUM: Magnesium: 1.3 mg/dL — ABNORMAL LOW (ref 1.7–2.4)

## 2021-04-12 MED ORDER — MAGNESIUM SULFATE 2 GM/50ML IV SOLN
2.0000 g | Freq: Once | INTRAVENOUS | Status: AC
  Administered 2021-04-12: 2 g via INTRAVENOUS
  Filled 2021-04-12: qty 50

## 2021-04-12 MED ORDER — SODIUM CHLORIDE 0.9% IV SOLUTION: Freq: Once | INTRAVENOUS | Status: AC

## 2021-04-12 NOTE — Progress Notes (Signed)
IP PROGRESS NOTE  Subjective:   He was alert when I saw him early this morning.  No complaint.  Objective: Vital signs in last 24 hours: Blood pressure 126/68, pulse 80, temperature 99.6 F (37.6 C), temperature source Oral, resp. rate 12, weight 184 lb 15.5 oz (83.9 kg), SpO2 97 %.  Intake/Output from previous day: 10/18 0701 - 10/19 0700 In: 1197.8 [P.O.:420; I.V.:677.8; IV Piggyback:100] Out: 1450 [Urine:1000]  Physical Exam:  Lymph nodes: 1 cm bilateral cervical/scalene in 1 to 2 cm axillary nodes Extremities: The right foot is cool and discolored   Lab Results: Recent Labs    04/11/21 0408 04/12/21 0512  WBC 142.1* 142.3*  HGB 10.3* 7.3*  HCT 32.2* 22.1*  PLT 149* 141*    BMET Recent Labs    04/11/21 0408 04/12/21 0512  NA 138 130*  K 4.3 4.4  CL 102 98  CO2 26 23  GLUCOSE 113* 247*  BUN 32* 38*  CREATININE 1.23 1.69*  CALCIUM 8.7* 7.7*    No results found for: CEA1, CEA, [id number], CA125  Studies/Results: DG Chest Port 1 View  Result Date: 04/10/2021 CLINICAL DATA:  Weakness EXAM: PORTABLE CHEST 1 VIEW COMPARISON:  None. FINDINGS: Heart and mediastinal contours are within normal limits. No focal opacities or effusions. No acute bony abnormality. IMPRESSION: No active disease. Electronically Signed   By: Rolm Baptise M.D.   On: 04/10/2021 20:31    Medications: I have reviewed the patient's current medications.  Assessment/Plan: 1. Leukocytosis/lymphocytosis-pathology from the right inguinal lymph node biopsy and peripheral blood flow cytometry are pending 2.  Normocytic anemia-progressive 04/12/2021 3.  Mild thrombocytopenia 4.  Critical limb ischemia of the right lower extremity status post revascularization 04/10/2021 5.  Dementia 6.  Diabetes mellitus 7.  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  Tobacco dependence 10.  Alcohol use  Recommendations: Transfuse packed red blood cells per the medical service Management of the ischemic right  lower extremity per vascular surgery We will follow-up on the lymph node pathology and peripheral blood flow cytometry I will be out until 04/17/2021, Dr. Marin Olp will see him over the next few days, please call oncology as needed   Mr. Fonder continues postoperative care per the vascular surgery service with plans for a right AKA.  He is more anemic today, likely related to surgical blood loss.  He has persistent leukocytosis, likely related to chronic lymphocytic leukemia.  We will follow-up on results from the inguinal lymph node biopsy and peripheral blood flow cytometry.  There is no indication for treating the CLL at present.   LOS: 2 days   Betsy Coder, MD   04/12/2021, 2:02 PM

## 2021-04-12 NOTE — Progress Notes (Signed)
Inpatient Diabetes Program Recommendations  AACE/ADA: New Consensus Statement on Inpatient Glycemic Control   Target Ranges:  Prepandial:   less than 140 mg/dL      Peak postprandial:   less than 180 mg/dL (1-2 hours)      Critically ill patients:  140 - 180 mg/dL    Results for Scott Garrett, Scott Garrett (MRN [id number]) as of 04/12/2021 09:20  Ref. Range 04/11/2021 06:11 04/11/2021 13:12 04/11/2021 16:32 04/11/2021 20:48 04/12/2021 06:09  Glucose-Capillary Latest Ref Range: 70 - 99 mg/dL 191 (H) 214 (H) 213 (H) 278 (H) 245 (H)   Review of Glycemic Control  Diabetes history: DM2 Outpatient Diabetes medications: Metformin 500 mg BID Current orders for Inpatient glycemic control: Semglee 10 daily, Novolog 0-15 TID with meals  Inpatient Diabetes Program Recommendations:    Insulin: Please consider increasing Semglee to 14 units daily and ordering Novolog 0-5 units QHS.  Diet: Added Carb Mod to Heart Healthy diet.  Thanks, Barnie Alderman, RN, MSN, CDE Diabetes Coordinator Inpatient Diabetes Program [phone] (Team Pager from 8am to 5pm)

## 2021-04-12 NOTE — Progress Notes (Addendum)
   VASCULAR SURGERY ASSESSMENT & PLAN:    Acute right lower extremity ischemia POD 2 right CFA to peroneal artery bypass with PTFE graft with RLE thrombectomy and 4 comp fasciotomies. Despite systemic heparin, patient lost doppler signals and foot and calf muscles are ischemic per Dr. Nicole Cella assessment last night. He spoke with patient's daughter. She and her family do not wish to pursue thrombectomy. RLE ischemic and will likely need AKA. Heparin infusion continues.  Anemia: likely acute blood loss and chronic etiologies BP and HR stable. Hgb down 3 grams this morning. Defer to primary service regarding transfusion.   Profound leukocytosis suspicious for CLL  History of dementia. SUBJECTIVE:   Awake, alert. One on one sitter at bedside. Denies pain.  PHYSICAL EXAM:   Vitals:   04/11/21 1500 04/11/21 2102 04/11/21 2316 04/12/21 0338  BP: 115/62 137/64 133/67 132/72  Pulse: (!) 102 92 90 90  Resp: 18 20 20 20   Temp: 100 F (37.8 C) 99.3 F (37.4 C) 100.1 F (37.8 C) 99.8 F (37.7 C)  TempSrc: Oral Oral Oral Oral  SpO2: 99% 99% 98% 99%  Weight:    83.9 kg   General appearance: Awake, alert in no apparent distress Cardiac: Heart rate and rhythm are regular Respirations: Nonlabored Incisions: Right groin, thigh incisions with dry dressings in place without bleeding or hematoma Extremities: Cannot wiggle right toes. ACE wrap in place on right lower leg. Toes cool and discolored.  LABS:   Lab Results  Component Value Date   WBC 142.3 (HH) 04/12/2021   HGB 7.3 (L) 04/12/2021   HCT 22.1 (L) 04/12/2021   MCV 88.8 04/12/2021   PLT 141 (L) 04/12/2021   Lab Results  Component Value Date   CREATININE 1.69 (H) 04/12/2021   No results found for: INR, PROTIME CBG (last 3)  Recent Labs    04/11/21 1632 04/11/21 2048 04/12/21 0609  GLUCAP 213* 278* 245*    PROBLEM LIST:    Principal Problem:   Critical limb ischemia of right lower extremity (HCC) Active Problems:    Dementia (HCC)   DM2 (diabetes mellitus, type 2) (HCC)   HTN (hypertension)   Leukocytosis   CURRENT MEDS:    Chlorhexidine Gluconate Cloth  6 each Topical Daily   insulin aspart  0-15 Units Subcutaneous TID WC   insulin glargine-yfgn  10 Units Subcutaneous Daily    Barbie Banner, PA-C  Office: [phone] 04/12/2021   I have independently interviewed and examined patient and agree with PA assessment and plan above. Plan will be for right aka on Friday as long as he remains stable until then. I have discussed with patient and also his daughter via phone as I do not believe he grasps the gravity of his situation.  Veida Spira C. Donzetta Matters, MD [facility] Office: [phone] Pager: [phone]

## 2021-04-12 NOTE — Progress Notes (Signed)
In to do Patient assessment. Unable to get pulses on right foot or ankle with doppler. Foot is cold. No pain voiced. Toes and bottom of foot black-blue in color. Daughter at bedside was told by M.D. he had pulses to his ankle  this a.m..Right foot swollen. Removed compression wrap but did not remove gauze dressing . Charge R.N. Stephanie aware and into assess right foot with doppler. She was also unable to get pulses . I did get right Pop. Pulse with doppler.Fem. pulse present.Used 2 dopplers. Patient has not voided since foley removed. Patient not eating or drinking much. Bladder scan and only got 35 ml. [name] was called and made aware of the above. [name] spent time with patient and family and see orders for I.V. fl. To be restarted. And Urine Culture when patient vds.m

## 2021-04-12 NOTE — Progress Notes (Signed)
PROGRESS NOTE  Scott Garrett MHD:[id number] DOB: Nov 04, 1945 DOA: 04/10/2021 PCP: [name]  HPI/Recap of past 24 hours: Scott Garrett is a 75 y.o. male with medical history significant of dementia, DM2, HTN, presents from home where he lives alone and has a caregiver who comes in 3 times a week.  Presented due to increase confusion and right foot pain.  Work-up revealed right lower extremity critical limb ischemia.  Also incidental findings of leukocytosis with WBC greater than 140.  Seen by vascular surgery, was taken to the OR emergently on 04/10/2021 by vascular surgery for Right lower extremity revascularization with fasciotomies.  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  UA positive for pyuria, started on Rocephin on 04/11/2021.  04/12/2021: Seen and examined at bedside.  Denies pain in his right lower extremity.  His right foot is cool to the touch and discolored.  He is unable to wiggle his right toes.  Seen by vascular surgery, plan for right AKA on Friday.  Assessment/Plan: Principal Problem:   Critical limb ischemia of right lower extremity (HCC) Active Problems:   Dementia ([place])   DM2 (diabetes mellitus, type 2) ([place])   HTN (hypertension)   Leukocytosis  Right lower extremity critical limb ischemia status post revascularization and fasciotomies on 04/10/2021 by vascular surgery [name], lost Doppler signals, right foot and calf muscles now ischemic, plan for right AKA on Friday, 04/14/2021. He is currently on heparin drip, continue. Continue IV fluid hydration. Rest of management per vascular surgery.  Acute blood loss anemia with ongoing heparin drip Hemoglobin 7.3 this morning from 10.3 Repeat H&H and obtain type and screen. Transfuse to maintain hemoglobin greater than 8.  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 WBC greater than 140K. Medical oncology following Awaiting  confirmation.  Presumptive UTI Urine analysis positive for pyuria Follow urine culture, in process. Continue Rocephin 2 g daily empirically  Nonoliguric AKI, suspect multifactorial Baseline creatinine 0.90 with GFR greater than 60 Creatinine uprising 1.69 with GFR 42 Continue IV fluid hydration. Continue to monitor urine output Avoid nephrotoxic agents/dehydration and hypotension. Repeat renal panel in the morning.  Hypomagnesemia Magnesium 1.3 Repleted intravenously. Repeat level in the morning.  Acute metabolic encephalopathy in the setting of dementia likely secondary to UTI, acute illness. Reorient as needed Continue to treat underlting conditions Continue fall precautions  Chronic normocytic anemia Appears to be at his baseline hemoglobin 10.3, MCV 90 No overt bleeding Continue to closely monitor.  Mild thrombocytopenia Platelet count 149, downtrending 141. Monitor for now No overt bleeding.  Type 2 diabetes with hyperglycemia Hemoglobin A1c 11.5 on 04/10/2021 Continue to hold off home oral hypoglycemics Started Semglee 10 unit daily Continue insulin sliding scale.  Lactic acidosis Lactic acid 2.8, repeat 2.4. Continue IV fluid  Dementia with mild behavioral disturbances Bilateral mittens in place Has pulled out 3 IVs Reorient as needed    Code Status: Full code  Family Communication: None at bedside.  Disposition Plan: Likely will discharge to home with home health services once vascular surgery signs off.   Consultants: Vascular surgery  Procedures: Revascularization, fasciotomies on 04/10/2021 by vascular surgery [name].  Antimicrobials: Rocephin 2 g daily, 04/11/2021.  DVT prophylaxis: Heparin drip.  Status is: Inpatient  Patient will require at least 2 midnights for further evaluation and treatment of present condition.        Objective: Vitals:   04/11/21 2316 04/12/21 0338 04/12/21 0735 04/12/21 1121  BP: 133/67 132/72  130/61 126/68  Pulse: 90 90 86 80  Resp: 20 20 15 12   Temp: 100.1 F (37.8 C) 99.8 F (37.7 C) 99.7 F (37.6 C) 99.6 F (37.6 C)  TempSrc: Oral Oral Oral Oral  SpO2: 98% 99% 97% 97%  Weight:  83.9 kg      Intake/Output Summary (Last 24 hours) at 04/12/2021 1358 Last data filed at 04/12/2021 1000 Gross per 24 hour  Intake 1437.83 ml  Output 1450 ml  Net -12.17 ml   Filed Weights   04/10/21 1930 04/12/21 0338  Weight: 81.6 kg 83.9 kg    Exam:  General: 75 y.o. year-old male well-developed well-nourished in no acute distress.  He is alert and interactive and pleasantly confused.   Cardiovascular: Regular rate and rhythm no rubs or gallops.  Respiratory: Clear to auscultation with no wheezes or rales. Abdomen: Soft nontender normal bowel sounds present.  Musculoskeletal: Right lower extremity in surgical dressing, right toes all cool to the touch and discolored..  Trace edema in lower extremities bilaterally.   Skin: No ulcerative lesions noted.   Psychiatry: Mood is appropriate for condition and setting.   Data Reviewed: CBC: Recent Labs  Lab 04/10/21 1841 04/10/21 2130 04/10/21 2247 04/10/21 2307 04/11/21 0012 04/11/21 0122 04/11/21 0408 04/12/21 0512  WBC 134.8* 132.2*  --  134.0*  --   --  142.1* 142.3*  NEUTROABS  --  15.9*  --   --   --   --   --   --   HGB 12.4* 11.0*   < > 10.5* 11.2* 11.2* 10.3* 7.3*  HCT 38.7* 33.7*   < > 32.4* 33.0* 33.0* 32.2* 22.1*  MCV 91.3 90.3  --  91.3  --   --  90.7 88.8  PLT 169 158  --  142*  --   --  149* 141*   < > = values in this interval not displayed.   Basic Metabolic Panel: Recent Labs  Lab 04/10/21 1841 04/10/21 2247 04/11/21 0012 04/11/21 0122 04/11/21 0408 04/12/21 0512  NA 137 137 139 141 138 130*  K 4.7 4.6 3.9 3.7 4.3 4.4  CL 99 99 99 101 102 98  CO2 27  --   --   --  26 23  GLUCOSE 367* 323* 259* 199* 113* 247*  BUN 36* 30* 31* 30* 32* 38*  CREATININE 1.10 0.90 0.90 1.00 1.23 1.69*  CALCIUM 9.3  --    --   --  8.7* 7.7*  MG  --   --   --   --   --  1.3*  PHOS  --   --   --   --   --  2.5   GFR: CrCl cannot be calculated (Unknown ideal weight.). Liver Function Tests: No results for input(s): AST, ALT, ALKPHOS, BILITOT, PROT, ALBUMIN in the last 168 hours. No results for input(s): LIPASE, AMYLASE in the last 168 hours. No results for input(s): AMMONIA in the last 168 hours. Coagulation Profile: No results for input(s): INR, PROTIME in the last 168 hours. Cardiac Enzymes: Recent Labs  Lab 04/10/21 1940 04/11/21 0408  CKTOTAL 266 257   BNP (last 3 results) No results for input(s): PROBNP in the last 8760 hours. HbA1C: Recent Labs    04/10/21 2307  HGBA1C 11.5*   CBG: Recent Labs  Lab 04/11/21 1312 04/11/21 1632 04/11/21 2048 04/12/21 0609 04/12/21 1108  GLUCAP 214* 213* 278* 245* 184*   Lipid Profile: No results for input(s): CHOL, HDL, LDLCALC,  TRIG, CHOLHDL, LDLDIRECT in the last 72 hours. Thyroid Function Tests: No results for input(s): TSH, T4TOTAL, FREET4, T3FREE, THYROIDAB in the last 72 hours. Anemia Panel: No results for input(s): VITAMINB12, FOLATE, FERRITIN, TIBC, IRON, RETICCTPCT in the last 72 hours. Urine analysis:    Component Value Date/Time   COLORURINE YELLOW 04/11/2021 0328   APPEARANCEUR CLOUDY (A) 04/11/2021 0328   LABSPEC 1.012 04/11/2021 0328   PHURINE 5.0 04/11/2021 0328   GLUCOSEU 50 (A) 04/11/2021 0328   HGBUR MODERATE (A) 04/11/2021 0328   BILIRUBINUR NEGATIVE 04/11/2021 0328   KETONESUR 5 (A) 04/11/2021 0328   PROTEINUR 30 (A) 04/11/2021 0328   NITRITE NEGATIVE 04/11/2021 0328   LEUKOCYTESUR LARGE (A) 04/11/2021 0328   Sepsis Labs: @LABRCNTIP (procalcitonin:4,lacticidven:4)  ) Recent Results (from the past 240 hour(s))  Resp Panel by RT-PCR (Flu A&B, Covid) Nasopharyngeal Swab     Status: None   Collection Time: 04/10/21  6:42 PM   Specimen: Nasopharyngeal Swab; Nasopharyngeal(NP) swabs in vial transport medium  Result Value  Ref Range Status   SARS Coronavirus 2 by RT PCR NEGATIVE NEGATIVE Final    Comment: (NOTE) SARS-CoV-2 target nucleic acids are NOT DETECTED.  The SARS-CoV-2 RNA is generally detectable in upper respiratory specimens during the acute phase of infection. The lowest concentration of SARS-CoV-2 viral copies this assay can detect is 138 copies/mL. A negative result does not preclude SARS-Cov-2 infection and should not be used as the sole basis for treatment or other patient management decisions. A negative result may occur with  improper specimen collection/handling, submission of specimen other than nasopharyngeal swab, presence of viral mutation(s) within the areas targeted by this assay, and inadequate number of viral copies(<138 copies/mL). A negative result must be combined with clinical observations, patient history, and epidemiological information. The expected result is Negative.  Fact Sheet for Patients:  EntrepreneurPulse.com.au  Fact Sheet for Healthcare Providers:  IncredibleEmployment.be  This test is no t yet approved or cleared by the [place] FDA and  has been authorized for detection and/or diagnosis of SARS-CoV-2 by FDA under an Emergency Use Authorization (EUA). This EUA will remain  in effect (meaning this test can be used) for the duration of the COVID-19 declaration under Section 564(b)(1) of the Act, 21 U.S.C.section 360bbb-3(b)(1), unless the authorization is terminated  or revoked sooner.       Influenza A by PCR NEGATIVE NEGATIVE Final   Influenza B by PCR NEGATIVE NEGATIVE Final    Comment: (NOTE) The Xpert Xpress SARS-CoV-2/FLU/RSV plus assay is intended as an aid in the diagnosis of influenza from Nasopharyngeal swab specimens and should not be used as a sole basis for treatment. Nasal washings and aspirates are unacceptable for Xpert Xpress SARS-CoV-2/FLU/RSV testing.  Fact Sheet for  Patients: EntrepreneurPulse.com.au  Fact Sheet for Healthcare Providers: IncredibleEmployment.be  This test is not yet approved or cleared by the [place] FDA and has been authorized for detection and/or diagnosis of SARS-CoV-2 by FDA under an Emergency Use Authorization (EUA). This EUA will remain in effect (meaning this test can be used) for the duration of the COVID-19 declaration under Section 564(b)(1) of the Act, 21 U.S.C. section 360bbb-3(b)(1), unless the authorization is terminated or revoked.  Performed at [facility], [place] [address], [place], [place] [address]   Culture, blood (routine x 2)     Status: None (Preliminary result)   Collection Time: 04/10/21  8:55 PM   Specimen: BLOOD LEFT HAND  Result Value Ref Range Status   Specimen  Description BLOOD LEFT HAND  Final   Special Requests   Final    BOTTLES DRAWN AEROBIC AND ANAEROBIC Blood Culture results may not be optimal due to an inadequate volume of blood received in culture bottles   Culture   Final    NO GROWTH 2 DAYS Performed at [facility], [place] [address], [place], [place] [address]    Report Status PENDING  Incomplete  Culture, blood (routine x 2)     Status: None (Preliminary result)   Collection Time: 04/10/21  9:40 PM   Specimen: BLOOD LEFT HAND  Result Value Ref Range Status   Specimen Description BLOOD LEFT HAND  Final   Special Requests   Final    BOTTLES DRAWN AEROBIC AND ANAEROBIC Blood Culture results may not be optimal due to an inadequate volume of blood received in culture bottles   Culture   Final    NO GROWTH 2 DAYS Performed at [facility], [place] [address], [place], [place] [address]    Report Status PENDING  Incomplete      Studies: No results found.  Scheduled Meds:  Chlorhexidine Gluconate Cloth  6 each Topical Daily   insulin aspart  0-15 Units Subcutaneous TID WC   insulin glargine-yfgn  10 Units  Subcutaneous Daily    Continuous Infusions:  0.9 % NaCl with KCl 20 mEq / L 100 mL/hr at 04/12/21 0818   cefTRIAXone (ROCEPHIN)  IV 2 g (04/12/21 1320)   heparin 500 Units/hr (04/12/21 1119)   lactated ringers 75 mL/hr at 04/11/21 1816     LOS: 2 days     Kayleen Memos, MD [facility] Pager [phone]  If 7PM-7AM, please contact night-coverage [email or web address] Password [email or web address] 04/12/2021, 1:58 PM

## 2021-04-13 ENCOUNTER — Inpatient Hospital Stay (HOSPITAL_COMMUNITY): Payer: No Typology Code available for payment source

## 2021-04-13 DIAGNOSIS — I70221 Atherosclerosis of native arteries of extremities with rest pain, right leg: Secondary | ICD-10-CM | POA: Diagnosis not present

## 2021-04-13 DIAGNOSIS — D72829 Elevated white blood cell count, unspecified: Secondary | ICD-10-CM | POA: Diagnosis not present

## 2021-04-13 DIAGNOSIS — D696 Thrombocytopenia, unspecified: Secondary | ICD-10-CM | POA: Diagnosis not present

## 2021-04-13 DIAGNOSIS — D649 Anemia, unspecified: Secondary | ICD-10-CM | POA: Diagnosis not present

## 2021-04-13 LAB — BASIC METABOLIC PANEL
Anion gap: 9 (ref 5–15)
BUN: 27 mg/dL — ABNORMAL HIGH (ref 8–23)
CO2: 22 mmol/L (ref 22–32)
Calcium: 7.5 mg/dL — ABNORMAL LOW (ref 8.9–10.3)
Chloride: 105 mmol/L (ref 98–111)
Creatinine, Ser: 1.12 mg/dL (ref 0.61–1.24)
GFR, Estimated: >60 mL/min (ref >60)
Glucose, Bld: 158 mg/dL — ABNORMAL HIGH (ref 70–99)
Potassium: 4.5 mmol/L (ref 3.5–5.1)
Sodium: 136 mmol/L (ref 135–145)

## 2021-04-13 LAB — CBC
HCT: 25 % — ABNORMAL LOW (ref 39.0–52.0)
Hemoglobin: 8.3 g/dL — ABNORMAL LOW (ref 13.0–17.0)
MCH: 29.4 pg (ref 26.0–34.0)
MCHC: 33.2 g/dL (ref 30.0–36.0)
MCV: 88.7 fL (ref 80.0–100.0)
Platelets: 138 10*3/uL — ABNORMAL LOW (ref 150–400)
RBC: 2.82 MIL/uL — ABNORMAL LOW (ref 4.22–5.81)
RDW: 13.5 % (ref 11.5–15.5)
WBC: 142.2 10*3/uL (ref 4.0–10.5)
nRBC: 0 % (ref 0.0–0.2)

## 2021-04-13 LAB — PREPARE RBC (CROSSMATCH)

## 2021-04-13 LAB — IGG, IGA, IGM
IgA: 193 mg/dL (ref 61–437)
IgG (Immunoglobin G), Serum: 749 mg/dL (ref 603–1613)
IgM (Immunoglobulin M), Srm: 119 mg/dL (ref 15–143)

## 2021-04-13 LAB — HEMOGLOBIN AND HEMATOCRIT, BLOOD
HCT: 24.4 % — ABNORMAL LOW (ref 39.0–52.0)
HCT: 29.9 % — ABNORMAL LOW (ref 39.0–52.0)
Hemoglobin: 8 g/dL — ABNORMAL LOW (ref 13.0–17.0)
Hemoglobin: 9.7 g/dL — ABNORMAL LOW (ref 13.0–17.0)

## 2021-04-13 LAB — GLUCOSE, CAPILLARY
Glucose-Capillary: 164 mg/dL — ABNORMAL HIGH (ref 70–99)
Glucose-Capillary: 213 mg/dL — ABNORMAL HIGH (ref 70–99)
Glucose-Capillary: 141 mg/dL — ABNORMAL HIGH (ref 70–99)

## 2021-04-13 LAB — HEPARIN LEVEL (UNFRACTIONATED)
Heparin Unfractionated: <0.1 IU/mL — ABNORMAL LOW (ref 0.30–0.70)
Heparin Unfractionated: 0.13 IU/mL — ABNORMAL LOW (ref 0.30–0.70)

## 2021-04-13 LAB — MAGNESIUM: Magnesium: 1.7 mg/dL (ref 1.7–2.4)

## 2021-04-13 LAB — MRSA NEXT GEN BY PCR, NASAL: MRSA by PCR Next Gen: NOT DETECTED

## 2021-04-13 MED ORDER — HEPARIN (PORCINE) 25000 UT/250ML-% IV SOLN
1300.0000 [IU]/h | INTRAVENOUS | Status: DC
  Administered 2021-04-13: 1000 [IU]/h via INTRAVENOUS
  Administered 2021-04-14: 1300 [IU]/h via INTRAVENOUS
  Filled 2021-04-13 (×2): qty 250

## 2021-04-13 MED ORDER — SODIUM CHLORIDE 0.9% IV SOLUTION: Freq: Once | INTRAVENOUS | Status: AC

## 2021-04-13 MED ORDER — IOHEXOL 9 MG/ML PO SOLN
ORAL | Status: AC
  Administered 2021-04-13: 500 mL
  Filled 2021-04-13: qty 1000

## 2021-04-13 NOTE — Progress Notes (Signed)
Inpatient Diabetes Program Recommendations  AACE/ADA: New Consensus Statement on Inpatient Glycemic Control  Target Ranges:  Prepandial:   less than 140 mg/dL      Peak postprandial:   less than 180 mg/dL (1-2 hours)      Critically ill patients:  140 - 180 mg/dL   Results for [name], [name] (MRN [id number]) as of 04/13/2021 12:18  Ref. Range 04/12/2021 06:09 04/12/2021 11:08 04/12/2021 16:27 04/12/2021 21:11 04/13/2021 06:08 04/13/2021 11:07  Glucose-Capillary Latest Ref Range: 70 - 99 mg/dL 245 (H) 184 (H) 259 (H) 145 (H) 164 (H) 213 (H)   Review of Glycemic Control  Diabetes history: DM2 Outpatient Diabetes medications: Metformin 500 mg BID Current orders for Inpatient glycemic control: Semglee 10 daily, Novolog 0-15 TID with meals   Inpatient Diabetes Program Recommendations:     Insulin: Please consider increasing Semglee to 12 units daily and ordering Novolog 0-5 units QHS.   Thanks, Scott Alderman, RN, MSN, CDE Diabetes Coordinator Inpatient Diabetes Program [phone] (Team Pager from 8am to 5pm)

## 2021-04-13 NOTE — Progress Notes (Signed)
[place] for IV heparin Indication: Extensive DVT  No Known Allergies  Patient Measurements: Weight: 83.9 kg (184 lb 15.5 oz) Heparin Dosing Weight: TBW  Vital Signs: Temp: 97.7 F (36.5 C) (10/20 0717) Temp Source: Oral (10/20 0717) BP: 172/85 (10/20 0717) Pulse Rate: 84 (10/20 0717)  Labs: Recent Labs    04/10/21 1940 04/10/21 2130 04/11/21 0408 04/12/21 0512 04/12/21 1425 04/12/21 2341 04/13/21 0515  HGB  --    < > 10.3* 7.3* 7.2* 8.0* 8.3*  HCT  --    < > 32.2* 22.1* 22.1* 24.4* 25.0*  PLT  --    < > 149* 141*  --   --  138*  HEPARINUNFRC  --   --  0.84* <0.10*  --   --  <0.10*  CREATININE  --    < > 1.23 1.69*  --   --  1.12  CKTOTAL 266  --  257  --   --   --   --    < > = values in this interval not displayed.     CrCl cannot be calculated (Unknown ideal weight.).   Medical History: Past Medical History:  Diagnosis Date   Colon polyps    Dementia ([place])    Diabetic retinopathy ([place])    DM2 (diabetes mellitus, type 2) ([place])    HTN (hypertension)     Medications:  Medications Prior to Admission  Medication Sig Dispense Refill Last Dose   Cholecalciferol 25 MCG (1000 UT) TBDP Take 1 tablet by mouth daily.   Past Week   donepezil (ARICEPT) 10 MG tablet Take 10 mg by mouth at bedtime.   Past Week   lisinopril (ZESTRIL) 5 MG tablet Take 5 mg by mouth daily.   Past Week   metFORMIN (GLUCOPHAGE) 500 MG tablet Take 500 mg by mouth 2 (two) times daily with a meal.   Past Week   Scheduled:   sodium chloride   Intravenous Once   Chlorhexidine Gluconate Cloth  6 each Topical Daily   insulin aspart  0-15 Units Subcutaneous TID WC   insulin glargine-yfgn  10 Units Subcutaneous Daily   PRN:   Assessment: 22 yoM with PMH dementia and DM2 admitted after being found on floor by daughter after unknown length of time. R foot appears to be acutely ischemic.   Patient is now POD 3 from revascularization and fasciotomies on  10/17. Dopplers lost the next day, no plans to pursue thrombectomy. Patient is scheduled for AKA 10/21. Extensive DVT noted on exam. Patient has been on low dose heparin dosed by vascular surgery started post op, new orders received to change to full dose heparin. Patient also with anemia, hgb 7s yesterday and transfused 1 unit, back up to 8.3 this am.   Goal of Therapy: Heparin level 0.3-0.7 units/ml Monitor platelets by anticoagulation protocol: Yes  Plan: Increase heparin to 1000 units/hr Recheck heparin level 8 hours after rate change  Erin Hearing PharmD., BCPS Clinical Pharmacist 04/13/2021 9:57 AM

## 2021-04-13 NOTE — Progress Notes (Signed)
[name] is doing okay this morning.  He is a very nice man.  He served in the [employer].  As such he is a true [employer] hero.  He is on heparin drip.  He has some renal insufficiency.  We do need to get a CT of his body to assess for lymphadenopathy.  I think this will be helpful with respect to his CLL.  We are still awaiting the biopsy to come back of the lymph node and the flow cytometry on the peripheral blood.  He was transfused with blood because of anemia secondary to blood loss.  His hemoglobin this morning is 8.3.  Platelet count 138,000.  He is diabetic.  Blood sugar was 247 yesterday.  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  It is hard to say if this is at all related to him having the ischemia and arterial thrombus.  The only thing I could think of would be that he has lymphadenopathy pressing on his iliac vessels.  He is post to go for a right AKA tomorrow.  He has had no fever.  He has had no cough.  There is no nausea or vomiting.  He has had no change in bowel or bladder habits.  We still have to await the biopsy results.  Hopefully they will be out today.  It is hard  to say whether or not he will actually need to be treated.  When he came in, he really was not that anemic.  As such, I think the anemia was secondary to blood loss.  Again he is a really nice guy.  He served in [employer].  Again, he deserves our best care because of his sacrifice for our country.  Scott Haw, MD  Scott Garrett 12:2

## 2021-04-13 NOTE — Progress Notes (Addendum)
   VASCULAR SURGERY ASSESSMENT & PLAN:   Acute right lower extremity ischemia POD 2 right CFA to peroneal artery bypass with PTFE graft with RLE thrombectomy and 4 comp fasciotomies. Despite systemic heparin, patient lost doppler signals and foot and calf muscles are ischemic per Dr. Nicole Cella assessment Tuesday evening. He spoke with patient's daughter. She and her family do not wish to pursue thrombectomy. RLE ischemic and will likely need AKA>>planned for tomorrow with Dr. Trula Slade. Persistent low grade temp with stable BP and HR. Heparin infusion continues. NPO after MN.   Anemia: likely acute blood loss and chronic etiologies. Received one unit PCs yesterday. Hgb down 8.3 this morning.    Profound leukocytosis suspicious for CLL   History of dementia.  SUBJECTIVE:    Awake, alert not capable of understanding leg ischemia secondary to dementia. One on one sitter at bedside. Denies pain.  PHYSICAL EXAM:   Vitals:   04/12/21 2145 04/12/21 2336 04/13/21 0100 04/13/21 0321  BP:  135/75  138/74  Pulse:  82  80  Resp: 20 17 20 17   Temp:  99.9 F (37.7 C)  100 F (37.8 C)  TempSrc:  Oral  Oral  SpO2:  96%  96%  Weight:       General appearance: Awake, alert in no apparent distress Cardiac: Heart rate and rhythm are regular Respirations: Nonlabored Incisions: Right groin, thigh incisions with dry dressings in place with tin amount of oozing. No hematoma Extremities: Cannot wiggle right toes. ACE wrap in place on right lower leg. Toes cool and discolored.  LABS:   Lab Results  Component Value Date   WBC PENDING 04/13/2021   HGB 8.3 (L) 04/13/2021   HCT 25.0 (L) 04/13/2021   MCV 88.7 04/13/2021   PLT 138 (L) 04/13/2021   Lab Results  Component Value Date   CREATININE 1.12 04/13/2021   No results found for: INR, PROTIME CBG (last 3)  Recent Labs    04/12/21 1627 04/12/21 2111 04/13/21 0608  GLUCAP 259* 145* 164*    PROBLEM LIST:    Principal Problem:   Critical  limb ischemia of right lower extremity (HCC) Active Problems:   Dementia (HCC)   DM2 (diabetes mellitus, type 2) (HCC)   HTN (hypertension)   Leukocytosis   CURRENT MEDS:    Chlorhexidine Gluconate Cloth  6 each Topical Daily   insulin aspart  0-15 Units Subcutaneous TID WC   insulin glargine-yfgn  10 Units Subcutaneous Daily    Barbie Banner, PA-C  Office: [phone] 04/13/2021   I have independently interviewed and examined patient and agree with PA assessment and plan above. Plan for Right aka tomorrow in the OR. Full dose heparin for extensive right lower extremity dvt found on exam.   Anikah Hogge C. Donzetta Matters, MD [facility] Office: [phone] Pager: [phone]

## 2021-04-13 NOTE — Progress Notes (Signed)
PROGRESS NOTE  Scott Garrett [id number] DOB: 06-16-1946 DOA: 04/10/2021 PCP: [name]  HPI/Recap of past 24 hours: Scott Garrett is a 75 y.o. male with medical history significant of dementia, DM2, HTN, presents from home where he lives alone and has a caregiver who comes in 3 times a week.  Presented due to increase confusion and right foot pain.  Work-up revealed right lower extremity critical limb ischemia.  Also incidental findings of leukocytosis with WBC greater than 140.  Seen by vascular surgery, was taken to the OR emergently on 04/10/2021 by vascular surgery for Right lower extremity revascularization with fasciotomies.  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  UA positive for pyuria, started on Rocephin on 04/11/2021.  Lost Doppler signals to right foot and right calf muscles, now ischemic plan for right AKA on 04/14/21.  Hospital course complicated by acute blood loss, transfused total 2 units PRBCs for hemoglobin of 7.3 on 04/12/2021 and 04/13/2021.  04/13/2021: Seen and examined.  No new complaints.   Assessment/Plan: Principal Problem:   Critical limb ischemia of right lower extremity (HCC) Active Problems:   Dementia ([place])   DM2 (diabetes mellitus, type 2) ([place])   HTN (hypertension)   Leukocytosis  Right lower extremity critical limb ischemia status post revascularization and fasciotomies on 04/10/2021 by vascular surgery [name], lost Doppler signals, right foot and calf muscles now ischemic, plan for right AKA on 04/14/2021. N.p.o. after midnight. Currently on heparin drip, IV fluid hydration, and IV Rocephin. Management per vascular surgery.  Acute blood loss anemia with ongoing heparin drip Hemoglobin 7.3 this morning from 10.3 Repeat H&H and obtain type and screen. Transfuse to maintain hemoglobin greater than 8.  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 WBC greater  than 140K. Medical oncology following Awaiting confirmation from lymph node biopsy and flow cytometry on the peripheral blood.  Presumptive UTI Urine analysis positive for pyuria Urine culture no growth.  Nonoliguric AKI, suspect multifactorial Baseline creatinine 0.90 with GFR greater than 60 Creatinine down trending 1.12 from 1.69 Continue IV fluid hydration. Continue to monitor urine output, last UO 2.1L last 24 hours Continue to avoid nephrotoxic agents/dehydration and hypotension. Repeat renal panel in the morning.  Resolved post repletion hypomagnesemia Magnesium 1.3>1.7 Repleted intravenously. Repeat level in the morning.  Acute metabolic encephalopathy in the setting of dementia likely secondary to UTI, acute illness. Continue fall precautions Soft restraints as needed to avoid pulling out IV access  Mild thrombocytopenia Platelet count downtrending 138 from 141. Continue to closely monitor.  Type 2 diabetes with hyperglycemia Hemoglobin A1c 11.5 on 04/10/2021 Continue to hold off home oral hypoglycemics Continue Semglee 10 unit daily Continue insulin sliding scale.  Lactic acidosis Lactic acid 2.8, repeat 2.4. Continue IV fluid  Dementia with behavioral disturbances Bilateral mittens in place Has pulled out 3 IVs Continue to reorient as needed    Code Status: Full code  Family Communication: None at bedside.  Disposition Plan: Likely will discharge to home with home health services once vascular surgery signs off.   Consultants: Vascular surgery  Procedures: Revascularization, fasciotomies on 04/10/2021 by vascular surgery [name].  Antimicrobials: Rocephin 2 g daily, 04/11/2021.  DVT prophylaxis: Heparin drip.  Status is: Inpatient  Patient will require at least 2 midnights for further evaluation and treatment of present condition.        Objective: Vitals:   04/13/21 0717 04/13/21 1103 04/13/21 1430 04/13/21 1458  BP: (!)  172/85 130/80  120/65 (!) 116/59  Pulse: 84 90 93 88  Resp: 20 20 20 20   Temp: 97.7 F (36.5 C) 97.8 F (36.6 C) 99.1 F (37.3 C) 99.9 F (37.7 C)  TempSrc: Oral Oral Oral Oral  SpO2: 98% 99% 100% 98%  Weight:        Intake/Output Summary (Last 24 hours) at 04/13/2021 1710 Last data filed at 04/13/2021 1515 Gross per 24 hour  Intake 827.67 ml  Output 1900 ml  Net -1072.33 ml   Filed Weights   04/10/21 1930 04/12/21 0338  Weight: 81.6 kg 83.9 kg    Exam:  General: 75 y.o. year-old male well-developed well-nourished in no acute distress.  He is alert and confused.   Cardiovascular: Regular rate and rhythm no rubs or gallops.  Respiratory: Clear to auscultation with no wheezes or rales.  Abdomen: Soft nontender normal bowel sounds present.  Musculoskeletal: Right lower extremity in surgical dressing, right toes all cool to the touch and discolored..  Trace edema in lower extremities bilaterally.   Skin: Discolored right foot. Psychiatry: Mood is appropriate for condition and setting.   Data Reviewed: CBC: Recent Labs  Lab 04/10/21 2130 04/10/21 2247 04/10/21 2307 04/11/21 0012 04/11/21 0408 04/12/21 0512 04/12/21 1425 04/12/21 2341 04/13/21 0515  WBC 132.2*  --  134.0*  --  142.1* 142.3*  --   --  142.2*  NEUTROABS 15.9*  --   --   --   --   --   --   --   --   HGB 11.0*   < > 10.5*   < > 10.3* 7.3* 7.2* 8.0* 8.3*  HCT 33.7*   < > 32.4*   < > 32.2* 22.1* 22.1* 24.4* 25.0*  MCV 90.3  --  91.3  --  90.7 88.8  --   --  88.7  PLT 158  --  142*  --  149* 141*  --   --  138*   < > = values in this interval not displayed.   Basic Metabolic Panel: Recent Labs  Lab 04/10/21 1841 04/10/21 2247 04/11/21 0012 04/11/21 0122 04/11/21 0408 04/12/21 0512 04/13/21 0515  NA 137   < > 139 141 138 130* 136  K 4.7   < > 3.9 3.7 4.3 4.4 4.5  CL 99   < > 99 101 102 98 105  CO2 27  --   --   --  26 23 22   GLUCOSE 367*   < > 259* 199* 113* 247* 158*  BUN 36*   < > 31* 30* 32*  38* 27*  CREATININE 1.10   < > 0.90 1.00 1.23 1.69* 1.12  CALCIUM 9.3  --   --   --  8.7* 7.7* 7.5*  MG  --   --   --   --   --  1.3* 1.7  PHOS  --   --   --   --   --  2.5  --    < > = values in this interval not displayed.   GFR: CrCl cannot be calculated (Unknown ideal weight.). Liver Function Tests: No results for input(s): AST, ALT, ALKPHOS, BILITOT, PROT, ALBUMIN in the last 168 hours. No results for input(s): LIPASE, AMYLASE in the last 168 hours. No results for input(s): AMMONIA in the last 168 hours. Coagulation Profile: No results for input(s): INR, PROTIME in the last 168 hours. Cardiac Enzymes: Recent Labs  Lab 04/10/21 1940 04/11/21 0408  CKTOTAL 266 257  BNP (last 3 results) No results for input(s): PROBNP in the last 8760 hours. HbA1C: Recent Labs    04/10/21 2307  HGBA1C 11.5*   CBG: Recent Labs  Lab 04/12/21 1627 04/12/21 2111 04/13/21 0608 04/13/21 1107 04/13/21 1602  GLUCAP 259* 145* 164* 213* 141*   Lipid Profile: No results for input(s): CHOL, HDL, LDLCALC, TRIG, CHOLHDL, LDLDIRECT in the last 72 hours. Thyroid Function Tests: No results for input(s): TSH, T4TOTAL, FREET4, T3FREE, THYROIDAB in the last 72 hours. Anemia Panel: No results for input(s): VITAMINB12, FOLATE, FERRITIN, TIBC, IRON, RETICCTPCT in the last 72 hours. Urine analysis:    Component Value Date/Time   COLORURINE YELLOW 04/11/2021 0328   APPEARANCEUR CLOUDY (A) 04/11/2021 0328   LABSPEC 1.012 04/11/2021 0328   PHURINE 5.0 04/11/2021 0328   GLUCOSEU 50 (A) 04/11/2021 0328   HGBUR MODERATE (A) 04/11/2021 0328   BILIRUBINUR NEGATIVE 04/11/2021 0328   KETONESUR 5 (A) 04/11/2021 0328   PROTEINUR 30 (A) 04/11/2021 0328   NITRITE NEGATIVE 04/11/2021 0328   LEUKOCYTESUR LARGE (A) 04/11/2021 0328   Sepsis Labs: @LABRCNTIP (procalcitonin:4,lacticidven:4)  ) Recent Results (from the past 240 hour(s))  Resp Panel by RT-PCR (Flu A&B, Covid) Nasopharyngeal Swab     Status:  None   Collection Time: 04/10/21  6:42 PM   Specimen: Nasopharyngeal Swab; Nasopharyngeal(NP) swabs in vial transport medium  Result Value Ref Range Status   SARS Coronavirus 2 by RT PCR NEGATIVE NEGATIVE Final    Comment: (NOTE) SARS-CoV-2 target nucleic acids are NOT DETECTED.  The SARS-CoV-2 RNA is generally detectable in upper respiratory specimens during the acute phase of infection. The lowest concentration of SARS-CoV-2 viral copies this assay can detect is 138 copies/mL. A negative result does not preclude SARS-Cov-2 infection and should not be used as the sole basis for treatment or other patient management decisions. A negative result may occur with  improper specimen collection/handling, submission of specimen other than nasopharyngeal swab, presence of viral mutation(s) within the areas targeted by this assay, and inadequate number of viral copies(<138 copies/mL). A negative result must be combined with clinical observations, patient history, and epidemiological information. The expected result is Negative.  Fact Sheet for Patients:  EntrepreneurPulse.com.au  Fact Sheet for Healthcare Providers:  IncredibleEmployment.be  This test is no t yet approved or cleared by the [place] FDA and  has been authorized for detection and/or diagnosis of SARS-CoV-2 by FDA under an Emergency Use Authorization (EUA). This EUA will remain  in effect (meaning this test can be used) for the duration of the COVID-19 declaration under Section 564(b)(1) of the Act, 21 U.S.C.section 360bbb-3(b)(1), unless the authorization is terminated  or revoked sooner.       Influenza A by PCR NEGATIVE NEGATIVE Final   Influenza B by PCR NEGATIVE NEGATIVE Final    Comment: (NOTE) The Xpert Xpress SARS-CoV-2/FLU/RSV plus assay is intended as an aid in the diagnosis of influenza from Nasopharyngeal swab specimens and should not be used as a sole basis for  treatment. Nasal washings and aspirates are unacceptable for Xpert Xpress SARS-CoV-2/FLU/RSV testing.  Fact Sheet for Patients: EntrepreneurPulse.com.au  Fact Sheet for Healthcare Providers: IncredibleEmployment.be  This test is not yet approved or cleared by the [place] FDA and has been authorized for detection and/or diagnosis of SARS-CoV-2 by FDA under an Emergency Use Authorization (EUA). This EUA will remain in effect (meaning this test can be used) for the duration of the COVID-19 declaration under Section 564(b)(1) of the Act, 21 U.S.C. section  360bbb-3(b)(1), unless the authorization is terminated or revoked.  Performed at [facility], [place] [address] [place], [place] [address]   Culture, blood (routine x 2)     Status: None (Preliminary result)   Collection Time: 04/10/21  8:55 PM   Specimen: BLOOD LEFT HAND  Result Value Ref Range Status   Specimen Description BLOOD LEFT HAND  Final   Special Requests   Final    BOTTLES DRAWN AEROBIC AND ANAEROBIC Blood Culture results may not be optimal due to an inadequate volume of blood received in culture bottles   Culture   Final    NO GROWTH 3 DAYS Performed at [facility], [place] [address] [place], [place] [address]    Report Status PENDING  Incomplete  Culture, blood (routine x 2)     Status: None (Preliminary result)   Collection Time: 04/10/21  9:40 PM   Specimen: BLOOD LEFT HAND  Result Value Ref Range Status   Specimen Description BLOOD LEFT HAND  Final   Special Requests   Final    BOTTLES DRAWN AEROBIC AND ANAEROBIC Blood Culture results may not be optimal due to an inadequate volume of blood received in culture bottles   Culture   Final    NO GROWTH 3 DAYS Performed at [facility], [place] [address] [place], [place] [address]    Report Status PENDING  Incomplete  Urine Culture     Status: None   Collection Time: 04/12/21  3:50 AM    Specimen: Urine, Clean Catch  Result Value Ref Range Status   Specimen Description URINE, CLEAN CATCH  Final   Special Requests NONE  Final   Culture   Final    NO GROWTH Performed at [facility], [place] [address] [place], [place] [address]    Report Status 04/12/2021 FINAL  Final  MRSA Next Gen by PCR, Nasal     Status: None   Collection Time: 04/13/21 12:05 PM   Specimen: Nasal Mucosa; Nasal Swab  Result Value Ref Range Status   MRSA by PCR Next Gen NOT DETECTED NOT DETECTED Final    Comment: (NOTE) The GeneXpert MRSA Assay (FDA approved for NASAL specimens only), is one component of a comprehensive MRSA colonization surveillance program. It is not intended to diagnose MRSA infection nor to guide or monitor treatment for MRSA infections. Test performance is not FDA approved in patients less than 36 years old. Performed at [facility], [place] [address] [place], [name] [address]       Studies: CT CHEST ABDOMEN PELVIS WO CONTRAST  Result Date: 04/13/2021 CLINICAL DATA:  New diagnosis CLL, staging EXAM: CT CHEST, ABDOMEN AND PELVIS WITHOUT CONTRAST TECHNIQUE: Multidetector CT imaging of the chest, abdomen and pelvis was performed following the standard protocol without IV contrast. COMPARISON:  None. FINDINGS: CT CHEST FINDINGS Cardiovascular: Aortic atherosclerosis. Normal heart size. Three-vessel coronary artery calcifications. No pericardial effusion. Mediastinum/Nodes: Prominent bilateral axillary lymph nodes, largest right axillary nodes measuring up to 1.8 x 1.3 cm (series 3, image 21). No enlarged mediastinal or hilar lymph nodes. Thyroid gland, trachea, and esophagus demonstrate no significant findings. Lungs/Pleura: Mild centrilobular emphysema. Small bilateral pleural effusions and associated atelectasis or consolidation. Underlying bandlike scarring of the bilateral lung bases, right greater than left. Musculoskeletal: No chest wall mass or suspicious bone lesions  identified. Anasarca. CT ABDOMEN PELVIS FINDINGS Hepatobiliary: No solid liver abnormality is seen. No gallstones, gallbladder wall thickening, or biliary dilatation. Pancreas:  Unremarkable. No pancreatic ductal dilatation or surrounding inflammatory changes. Spleen: Splenomegaly, maximum span 14.8 cm. Adrenals/Urinary Tract: Adrenal glands are unremarkable. Kidneys are normal, without renal calculi, solid lesion, or hydronephrosis. Bladder is unremarkable. Stomach/Bowel: Stomach is within normal limits. Appendix appears normal. No evidence of bowel wall thickening, distention, or inflammatory changes. Vascular/Lymphatic: Aortic atherosclerosis. Enlarged bilateral iliac, pelvic sidewall, and inguinal lymph nodes, largest left iliac node measuring 2.4 x 1.5 cm (series 3, image 98), largest right pelvic sidewall lymph node measuring 2.5 x 1.2 cm (series 3, image 106), and largest right inguinal nodes measuring 1.4 x 1.2 cm (series 3, image 122). Reproductive: No mass or other abnormality. Other: No abdominal wall hernia. Anasarca. Incision over the right femoral vessels with a subcutaneous hematoma or seroma measuring 5.4 x 3.2 cm. Scattered small air loculations within this collection (series 3, image 116). No abdominopelvic ascites. Musculoskeletal: No acute or significant osseous findings. IMPRESSION: 1. Enlarged bilateral axillary, iliac, pelvic sidewall, and inguinal lymph nodes, as detailed above and in keeping with reported diagnosis of CLL. There is no lymphadenopathy internal to the chest or in the upper abdomen. 2. Splenomegaly. 3. Incision over the right femoral vessels with a subcutaneous hematoma or seroma measuring 5.4 x 3.2 cm. Scattered small air loculations within this collection. Findings are consistent with recent biopsy. The presence or absence of infection is not established by CT. 4. Small bilateral pleural effusions and associated atelectasis or consolidation. Underlying bandlike scarring of the  bilateral lung bases, right greater than left. 5. Mild emphysema. 6. Coronary artery disease. Aortic Atherosclerosis (ICD10-I70.0) and Emphysema (ICD10-J43.9). Electronically Signed   By: Delanna Ahmadi M.D.   On: 04/13/2021 16:04    Scheduled Meds:  Chlorhexidine Gluconate Cloth  6 each Topical Daily   insulin aspart  0-15 Units Subcutaneous TID WC   insulin glargine-yfgn  10 Units Subcutaneous Daily    Continuous Infusions:  0.9 % NaCl with KCl 20 mEq / L Stopped (04/13/21 1442)   cefTRIAXone (ROCEPHIN)  IV 2 g (04/13/21 0948)   heparin 1,000 Units/hr (04/13/21 1202)     LOS: 3 days     Kayleen Memos, MD [facility] Pager [phone]  If 7PM-7AM, please contact night-coverage [email or web address] Password [email or web address] 04/13/2021, 5:10 PM

## 2021-04-13 NOTE — Progress Notes (Signed)
ANTICOAGULATION CONSULT NOTE  Pharmacy Consult for IV heparin Indication: Extensive DVT  No Known Allergies  Patient Measurements: Weight: 83.9 kg (184 lb 15.5 oz) Heparin Dosing Weight: TBW  Vital Signs: Temp: 98.6 F (37 C) (10/20 2000) Temp Source: Oral (10/20 2000) BP: 131/73 (10/20 2000) Pulse Rate: 74 (10/20 2000)  Labs: Recent Labs    04/11/21 0408 04/12/21 0512 04/12/21 1425 04/12/21 2341 04/13/21 0515 04/13/21 1924  HGB 10.3* 7.3*   < > 8.0* 8.3* 9.7*  HCT 32.2* 22.1*   < > 24.4* 25.0* 29.9*  PLT 149* 141*  --   --  138*  --   HEPARINUNFRC 0.84* <0.10*  --   --  <0.10* 0.13*  CREATININE 1.23 1.69*  --   --  1.12  --   CKTOTAL 257  --   --   --   --   --    < > = values in this interval not displayed.     CrCl cannot be calculated (Unknown ideal weight.).   Medical History: Past Medical History:  Diagnosis Date   Colon polyps    Dementia ([place])    Diabetic retinopathy ([place])    DM2 (diabetes mellitus, type 2) ([place])    HTN (hypertension)     Medications:  Medications Prior to Admission  Medication Sig Dispense Refill Last Dose   Cholecalciferol 25 MCG (1000 UT) TBDP Take 1 tablet by mouth daily.   Past Week   donepezil (ARICEPT) 10 MG tablet Take 10 mg by mouth at bedtime.   Past Week   lisinopril (ZESTRIL) 5 MG tablet Take 5 mg by mouth daily.   Past Week   metFORMIN (GLUCOPHAGE) 500 MG tablet Take 500 mg by mouth 2 (two) times daily with a meal.   Past Week   Scheduled:   Chlorhexidine Gluconate Cloth  6 each Topical Daily   insulin aspart  0-15 Units Subcutaneous TID WC   insulin glargine-yfgn  10 Units Subcutaneous Daily   PRN:   Assessment: 43 yoM with PMH dementia and DM2 admitted after being found on floor by daughter after unknown length of time. R foot appears to be acutely ischemic.   Patient is now POD 3 from revascularization and fasciotomies on 10/17. No plans to pursue thrombectomy. Patient is scheduled for AKA 10/21. Extensive  DVT noted on exam. Patient has been on low dose heparin dosed by vascular surgery started post op, changed to heparin per pharmacy earlier today. Patient also with anemia requiring transfution.   Heparin level undetectable earlier today requiring rate increase to 1000 units/hr (no bolus given). Heparin level now resulting sub-therapeutic at 0.13.   Goal of Therapy: Heparin level 0.3-0.7 units/ml Monitor platelets by anticoagulation protocol: Yes  Plan: Increase heparin to 1300 units/hr Recheck heparin level 8 hours after rate change  Lorelei Pont, PharmD, BCPS 04/13/2021 8:45 PM ED Clinical Pharmacist -  [phone]

## 2021-04-14 ENCOUNTER — Inpatient Hospital Stay (HOSPITAL_COMMUNITY): Payer: No Typology Code available for payment source | Admitting: Anesthesiology

## 2021-04-14 ENCOUNTER — Encounter (HOSPITAL_COMMUNITY)
Admission: EM | Disposition: A | Discharge status: Skilled Nursing Facility | Payer: Self-pay | Source: Home / Self Care | Attending: Internal Medicine

## 2021-04-14 ENCOUNTER — Encounter (HOSPITAL_COMMUNITY): Payer: Self-pay | Admitting: Internal Medicine

## 2021-04-14 DIAGNOSIS — I70221 Atherosclerosis of native arteries of extremities with rest pain, right leg: Secondary | ICD-10-CM | POA: Diagnosis not present

## 2021-04-14 DIAGNOSIS — I70261 Atherosclerosis of native arteries of extremities with gangrene, right leg: Secondary | ICD-10-CM

## 2021-04-14 HISTORY — PX: AMPUTATION: SHX166

## 2021-04-14 LAB — BASIC METABOLIC PANEL
Anion gap: 3 — ABNORMAL LOW (ref 5–15)
BUN: 22 mg/dL (ref 8–23)
CO2: 24 mmol/L (ref 22–32)
Calcium: 7.3 mg/dL — ABNORMAL LOW (ref 8.9–10.3)
Chloride: 108 mmol/L (ref 98–111)
Creatinine, Ser: 0.99 mg/dL (ref 0.61–1.24)
GFR, Estimated: >60 mL/min (ref >60)
Glucose, Bld: 125 mg/dL — ABNORMAL HIGH (ref 70–99)
Potassium: 4.7 mmol/L (ref 3.5–5.1)
Sodium: 135 mmol/L (ref 135–145)

## 2021-04-14 LAB — BPAM RBC
Blood Product Expiration Date: 202211042359
Blood Product Expiration Date: 202211042359
ISSUE DATE / TIME: 202210191830
ISSUE DATE / TIME: 202210201427
Unit Type and Rh: 6200
Unit Type and Rh: 6200

## 2021-04-14 LAB — GLUCOSE, CAPILLARY
Glucose-Capillary: 116 mg/dL — ABNORMAL HIGH (ref 70–99)
Glucose-Capillary: 123 mg/dL — ABNORMAL HIGH (ref 70–99)
Glucose-Capillary: 130 mg/dL — ABNORMAL HIGH (ref 70–99)
Glucose-Capillary: 102 mg/dL — ABNORMAL HIGH (ref 70–99)
Glucose-Capillary: 78 mg/dL (ref 70–99)

## 2021-04-14 LAB — TYPE AND SCREEN
ABO/RH(D): A POS
Antibody Screen: NEGATIVE
Unit division: 0
Unit division: 0

## 2021-04-14 LAB — HEMOGLOBIN AND HEMATOCRIT, BLOOD
HCT: 29.2 % — ABNORMAL LOW (ref 39.0–52.0)
Hemoglobin: 9.6 g/dL — ABNORMAL LOW (ref 13.0–17.0)

## 2021-04-14 LAB — MAGNESIUM: Magnesium: 1.7 mg/dL (ref 1.7–2.4)

## 2021-04-14 LAB — HEPARIN LEVEL (UNFRACTIONATED)
Heparin Unfractionated: 0.38 IU/mL (ref 0.30–0.70)
Heparin Unfractionated: <0.1 IU/mL — ABNORMAL LOW (ref 0.30–0.70)

## 2021-04-14 LAB — CBC
HCT: 26.7 % — ABNORMAL LOW (ref 39.0–52.0)
Hemoglobin: 8.7 g/dL — ABNORMAL LOW (ref 13.0–17.0)
MCH: 29.4 pg (ref 26.0–34.0)
MCHC: 32.6 g/dL (ref 30.0–36.0)
MCV: 90.2 fL (ref 80.0–100.0)
Platelets: 141 10*3/uL — ABNORMAL LOW (ref 150–400)
RBC: 2.96 MIL/uL — ABNORMAL LOW (ref 4.22–5.81)
RDW: 14.1 % (ref 11.5–15.5)
WBC: 147.5 10*3/uL (ref 4.0–10.5)
nRBC: 0 % (ref 0.0–0.2)

## 2021-04-14 SURGERY — AMPUTATION, ABOVE KNEE
Anesthesia: General | Site: Knee | Laterality: Right

## 2021-04-14 MED ORDER — AMISULPRIDE (ANTIEMETIC) 5 MG/2ML IV SOLN: 10.0000 mg | Freq: Once | INTRAVENOUS | Status: DC | PRN

## 2021-04-14 MED ORDER — PROPOFOL 10 MG/ML IV BOLUS
INTRAVENOUS | Status: DC | PRN
  Administered 2021-04-14: 130 mg via INTRAVENOUS

## 2021-04-14 MED ORDER — PROPOFOL 10 MG/ML IV BOLUS
INTRAVENOUS | Status: AC
  Filled 2021-04-14: qty 20

## 2021-04-14 MED ORDER — FENTANYL CITRATE (PF) 250 MCG/5ML IJ SOLN
INTRAMUSCULAR | Status: AC
  Filled 2021-04-14: qty 5

## 2021-04-14 MED ORDER — NICOTINE 14 MG/24HR TD PT24
14.0000 mg | MEDICATED_PATCH | Freq: Every day | TRANSDERMAL | Status: DC
  Administered 2021-04-14 – 2021-04-25 (×11): 14 mg via TRANSDERMAL
  Filled 2021-04-14 (×13): qty 1

## 2021-04-14 MED ORDER — HEPARIN SODIUM (PORCINE) 5000 UNIT/ML IJ SOLN: 5000.0000 [IU] | Freq: Three times a day (TID) | INTRAMUSCULAR | Status: DC

## 2021-04-14 MED ORDER — CHLORHEXIDINE GLUCONATE 0.12 % MT SOLN: 15.0000 mL | Freq: Once | OROMUCOSAL | Status: AC

## 2021-04-14 MED ORDER — PHENYLEPHRINE HCL (PRESSORS) 10 MG/ML IV SOLN
INTRAVENOUS | Status: DC | PRN
  Administered 2021-04-14: 80 ug via INTRAVENOUS
  Administered 2021-04-14: 120 ug via INTRAVENOUS
  Administered 2021-04-14: 80 ug via INTRAVENOUS

## 2021-04-14 MED ORDER — BACITRACIN ZINC 500 UNIT/GM EX OINT
TOPICAL_OINTMENT | CUTANEOUS | Status: DC | PRN
  Administered 2021-04-14: 1 via TOPICAL

## 2021-04-14 MED ORDER — ONDANSETRON HCL 4 MG/2ML IJ SOLN
INTRAMUSCULAR | Status: DC | PRN
  Administered 2021-04-14: 4 mg via INTRAVENOUS

## 2021-04-14 MED ORDER — LIDOCAINE 2% (20 MG/ML) 5 ML SYRINGE
INTRAMUSCULAR | Status: DC | PRN
  Administered 2021-04-14: 100 mg via INTRAVENOUS

## 2021-04-14 MED ORDER — ORAL CARE MOUTH RINSE: 15.0000 mL | Freq: Once | OROMUCOSAL | Status: AC

## 2021-04-14 MED ORDER — FENTANYL CITRATE (PF) 100 MCG/2ML IJ SOLN
25.0000 ug | INTRAMUSCULAR | Status: DC | PRN
  Administered 2021-04-14: 25 ug via INTRAVENOUS

## 2021-04-14 MED ORDER — LACTATED RINGERS IV SOLN: INTRAVENOUS | Status: DC

## 2021-04-14 MED ORDER — 0.9 % SODIUM CHLORIDE (POUR BTL) OPTIME
TOPICAL | Status: DC | PRN
  Administered 2021-04-14: 1000 mL

## 2021-04-14 MED ORDER — PROMETHAZINE HCL 25 MG/ML IJ SOLN: 6.2500 mg | INTRAMUSCULAR | Status: DC | PRN

## 2021-04-14 MED ORDER — BACITRACIN ZINC 500 UNIT/GM EX OINT
TOPICAL_OINTMENT | CUTANEOUS | Status: AC
  Filled 2021-04-14: qty 28.35

## 2021-04-14 MED ORDER — SODIUM CHLORIDE 0.9 % IV SOLN: INTRAVENOUS | Status: AC

## 2021-04-14 MED ORDER — CHLORHEXIDINE GLUCONATE 0.12 % MT SOLN
OROMUCOSAL | Status: AC
  Administered 2021-04-14: 15 mL via OROMUCOSAL
  Filled 2021-04-14: qty 15

## 2021-04-14 MED ORDER — HEPARIN SODIUM (PORCINE) 5000 UNIT/ML IJ SOLN
5000.0000 [IU] | Freq: Three times a day (TID) | INTRAMUSCULAR | Status: DC
  Administered 2021-04-14 – 2021-04-25 (×34): 5000 [IU] via SUBCUTANEOUS
  Filled 2021-04-14 (×32): qty 1

## 2021-04-14 MED ORDER — FENTANYL CITRATE (PF) 100 MCG/2ML IJ SOLN
INTRAMUSCULAR | Status: DC | PRN
  Administered 2021-04-14 (×2): 50 ug via INTRAVENOUS

## 2021-04-14 MED ORDER — FENTANYL CITRATE (PF) 100 MCG/2ML IJ SOLN
INTRAMUSCULAR | Status: AC
  Filled 2021-04-14: qty 2

## 2021-04-14 SURGICAL SUPPLY — 51 items
BAG COUNTER SPONGE SURGICOUNT (BAG) ×2 IMPLANT
BLADE SAW GIGLI 510 (BLADE) ×2 IMPLANT
BNDG COHESIVE 6X5 TAN STRL LF (GAUZE/BANDAGES/DRESSINGS) ×2 IMPLANT
BNDG ELASTIC 4X5.8 VLCR STR LF (GAUZE/BANDAGES/DRESSINGS) ×2 IMPLANT
BNDG ELASTIC 6X5.8 VLCR STR LF (GAUZE/BANDAGES/DRESSINGS) ×2 IMPLANT
BNDG GAUZE ELAST 4 BULKY (GAUZE/BANDAGES/DRESSINGS) ×2 IMPLANT
CANISTER SUCT 3000ML PPV (MISCELLANEOUS) ×2 IMPLANT
CLIP VESOCCLUDE MED 6/CT (CLIP) IMPLANT
COVER SURGICAL LIGHT HANDLE (MISCELLANEOUS) ×2 IMPLANT
DRAIN CHANNEL 19F RND (DRAIN) IMPLANT
DRAPE DERMATAC (DRAPES) IMPLANT
DRAPE HALF SHEET 40X57 (DRAPES) ×2 IMPLANT
DRAPE INCISE IOBAN 66X45 STRL (DRAPES) ×2 IMPLANT
DRAPE ORTHO SPLIT 77X108 STRL (DRAPES) ×2
DRAPE SURG ORHT 6 SPLT 77X108 (DRAPES) ×2 IMPLANT
DRESSING PREVENA PLUS CUSTOM (GAUZE/BANDAGES/DRESSINGS) IMPLANT
DRSG ADAPTIC 3X8 NADH LF (GAUZE/BANDAGES/DRESSINGS) ×2 IMPLANT
DRSG CURAD 3X16 NADH (PACKING) ×2 IMPLANT
DRSG PREVENA PLUS CUSTOM (GAUZE/BANDAGES/DRESSINGS)
ELECT CAUTERY BLADE 6.4 (BLADE) ×2 IMPLANT
ELECT REM PT RETURN 9FT ADLT (ELECTROSURGICAL) ×2
ELECTRODE REM PT RTRN 9FT ADLT (ELECTROSURGICAL) ×1 IMPLANT
EVACUATOR SILICONE 100CC (DRAIN) IMPLANT
GAUZE 4X4 16PLY ~~LOC~~+RFID DBL (SPONGE) ×2 IMPLANT
GAUZE SPONGE 4X4 12PLY STRL (GAUZE/BANDAGES/DRESSINGS) ×2 IMPLANT
GLOVE SRG 8 PF TXTR STRL LF DI (GLOVE) ×1 IMPLANT
GLOVE SURG POLYISO LF SZ7.5 (GLOVE) ×2 IMPLANT
GLOVE SURG UNDER POLY LF SZ8 (GLOVE) ×1
GOWN STRL REUS W/ TWL LRG LVL3 (GOWN DISPOSABLE) ×3 IMPLANT
GOWN STRL REUS W/ TWL XL LVL3 (GOWN DISPOSABLE) ×1 IMPLANT
GOWN STRL REUS W/TWL LRG LVL3 (GOWN DISPOSABLE) ×3
GOWN STRL REUS W/TWL XL LVL3 (GOWN DISPOSABLE) ×1
KIT BASIN OR (CUSTOM PROCEDURE TRAY) ×2 IMPLANT
KIT TURNOVER KIT B (KITS) ×2 IMPLANT
NS IRRIG 1000ML POUR BTL (IV SOLUTION) ×2 IMPLANT
PACK GENERAL/GYN (CUSTOM PROCEDURE TRAY) ×2 IMPLANT
PAD ARMBOARD 7.5X6 YLW CONV (MISCELLANEOUS) ×4 IMPLANT
PREVENA RESTOR ARTHOFORM 46X30 (CANNISTER) IMPLANT
SPONGE T-LAP 18X18 ~~LOC~~+RFID (SPONGE) ×2 IMPLANT
STAPLER VISISTAT 35W (STAPLE) ×2 IMPLANT
STOCKINETTE IMPERVIOUS LG (DRAPES) ×2 IMPLANT
SUT ETHILON 3 0 PS 1 (SUTURE) IMPLANT
SUT SILK 0 TIES 10X30 (SUTURE) IMPLANT
SUT SILK 2 0 (SUTURE)
SUT SILK 2-0 18XBRD TIE 12 (SUTURE) IMPLANT
SUT SILK 3 0 (SUTURE)
SUT SILK 3-0 18XBRD TIE 12 (SUTURE) IMPLANT
SUT VIC AB 2-0 CT1 18 (SUTURE) IMPLANT
TOWEL GREEN STERILE (TOWEL DISPOSABLE) ×2 IMPLANT
UNDERPAD 30X36 HEAVY ABSORB (UNDERPADS AND DIAPERS) ×2 IMPLANT
WATER STERILE IRR 1000ML POUR (IV SOLUTION) ×2 IMPLANT

## 2021-04-14 NOTE — Anesthesia Preprocedure Evaluation (Addendum)
Anesthesia Evaluation  Patient identified by MRN, date of birth, ID band Patient confused    Reviewed: Allergy & Precautions, NPO status , Patient's Chart, lab work & pertinent test results  History of Anesthesia Complications Negative for: history of anesthetic complications  Airway Mallampati: I  TM Distance: >3 FB Neck ROM: Full    Dental  (+) Edentulous Lower, Edentulous Upper   Pulmonary Current Smoker and Patient abstained from smoking.,    Pulmonary exam normal        Cardiovascular hypertension, Pt. on medications + Peripheral Vascular Disease  Normal cardiovascular exam     Neuro/Psych PSYCHIATRIC DISORDERS Dementia negative neurological ROS     GI/Hepatic negative GI ROS, Neg liver ROS,   Endo/Other  diabetes  Renal/GU negative Renal ROS     Musculoskeletal negative musculoskeletal ROS (+)   Abdominal   Peds  Hematology  (+) Blood dyscrasia, , CLL   Anesthesia Other Findings   Reproductive/Obstetrics                           Anesthesia Physical  Anesthesia Plan  ASA: 3  Anesthesia Plan: General   Post-op Pain Management:    Induction: Intravenous  PONV Risk Score and Plan: 3 and Ondansetron and Diphenhydramine  Airway Management Planned: LMA  Additional Equipment:   Intra-op Plan:   Post-operative Plan: Extubation in OR  Informed Consent:     Dental advisory given and Consent reviewed with POA  Plan Discussed with: Anesthesiologist and CRNA  Anesthesia Plan Comments:        Anesthesia Quick Evaluation

## 2021-04-14 NOTE — Anesthesia Postprocedure Evaluation (Signed)
Anesthesia Post Note  Patient: Scott Garrett  Procedure(s) Performed: RIGHT ABOVE KNEE AMPUTATION (Right: Knee)     Patient location during evaluation: PACU Anesthesia Type: General Level of consciousness: sedated Pain management: pain level controlled Vital Signs Assessment: post-procedure vital signs reviewed and stable Respiratory status: spontaneous breathing and respiratory function stable Cardiovascular status: stable Postop Assessment: no apparent nausea or vomiting Anesthetic complications: no   No notable events documented.  Last Vitals:  Vitals:   04/14/21 1204 04/14/21 1219  BP: (!) 141/76 (!) 148/74  Pulse: 71 72  Resp: 12 15  Temp:    SpO2: 100% 100%                 [name] DANIEL

## 2021-04-14 NOTE — Progress Notes (Addendum)
ANTICOAGULATION CONSULT NOTE  Pharmacy Consult for Heparin Indication:  extensive DVT  No Known Allergies  Patient Measurements: Height: 5\' 11"  (180.3 cm) Weight: 83.9 kg (184 lb 15.5 oz) IBW/kg (Calculated) : 75.3  Heparin Dosing Weight: 83.9 kg (TBW)  Vital Signs: Temp: 98.1 F (36.7 C) (10/21 1318) Temp Source: Axillary (10/21 1318) BP: 158/78 (10/21 1318) Pulse Rate: 76 (10/21 1318)  Labs: Recent Labs    04/12/21 0512 04/12/21 1425 04/13/21 0515 04/13/21 1924 04/14/21 0357 04/14/21 0623 04/14/21 1307  HGB 7.3*   < > 8.3* 9.7* 8.7* 9.6*  --   HCT 22.1*   < > 25.0* 29.9* 26.7* 29.2*  --   PLT 141*  --  138*  --  141*  --   --   HEPARINUNFRC <0.10*  --  <0.10* 0.13* 0.38  --  <0.10*  CREATININE 1.69*  --  1.12  --  0.99  --   --    < > = values in this interval not displayed.    Estimated Creatinine Clearance: 69.7 mL/min (by C-G formula based on SCr of 0.99 mg/dL).   Medications:  Scheduled:   Chlorhexidine Gluconate Cloth  6 each Topical Daily   insulin aspart  0-15 Units Subcutaneous TID WC   insulin glargine-yfgn  10 Units Subcutaneous Daily   Infusions:   sodium chloride 50 mL/hr at 04/14/21 1344   cefTRIAXone (ROCEPHIN)  IV 2 g (04/13/21 0948)   heparin 1,300 Units/hr (04/14/21 1350)    8 yoM with PMH dementia and DM2 admitted after being found on floor by daughter after unknown length of time. R foot appears to be acutely ischemic.    Patient is now POD 3 from revascularization and fasciotomies on 10/17. Dopplers lost the next day, no plans to pursue thrombectomy. Extensive DVT noted on exam. Patient now s/p AKA today.   Per discussion with RN, heparin infusion was held during surgery but then resumed post-op at ~14:00. After discussion with [name], will stop heparin at this time and change to DVT prophylaxis instead.     Plan:  - stop heparin infusion - start DVT prophylaxis  - Continue to monitor H&H and platelets   Thank you for  allowing pharmacy to be a part of this patient's care.  Ardyth Harps, PharmD Clinical Pharmacist

## 2021-04-14 NOTE — Op Note (Signed)
    NAME: Scott Garrett    MRN: [id number] DOB: [birth year]    DATE OF OPERATION: 04/14/2021  PREOP DIAGNOSIS:    Ischemic right lower extremity  POSTOP DIAGNOSIS:    Same  PROCEDURE:    Right above-the-knee amputation  SURGEON: Judeth Cornfield. Scot Dock, MD  ASSIST: Risa Grill, PA  ANESTHESIA: General  EBL: Minimal  INDICATIONS:    Scott Garrett is a 75 y.o. male who would presented with an ischemic right lower extremity.  Was not clear how long the limb had been ischemic but ultimately this was not salvageable and he presents for above-the-knee amputation  FINDINGS:   I did the amputation slightly higher than normal given that the muscle was marginally perfused in some areas  TECHNIQUE:   The patient was taken to the operating room and received a general anesthetic.  The right lower extremity was prepped and draped in the usual sterile fashion.  A fishmouth incision was made above the patella.  A tourniquet was placed on the upper thigh.  The leg was exsanguinated with an Esmarch bandage and the tourniquet inflated to 300 mmHg.  Under tourniquet control incision was carried down to the skin subcutaneous tissue fascia and muscle to the femur which was dissected free circumferentially.  The periosteum was elevated.  The bone was divided proximal to the level of skin division.  The arteries and veins were individually suture ligated with 2-0 silk ties.  The tourniquet was released.  Additional hemostasis was obtained using electrocautery and 2-0 silk ties.  The muscle appeared reasonably well perfused with some areas that were somewhat marginal.  The wound was irrigated with copious months of saline.  A layer was closed over the femur and then the fascial layer was closed with interrupted 2-0 Vicryl's.  The skin was closed with staples.  A sterile dressing was applied.  Patient tolerated procedure well was transferred to recovery room in stable condition.  All needle and sponge  counts were correct.  Given the complexity of the case a first assistant was necessary in order to expedient the procedure and safely perform the technical aspects of the operation.  Deitra Mayo, MD, FACS [facility]  DATE OF DICTATION:   04/14/2021

## 2021-04-14 NOTE — Anesthesia Procedure Notes (Signed)
Procedure Name: LMA Insertion Date/Time: 04/14/2021 9:46 AM Performed by: Babs Bertin, CRNA Pre-anesthesia Checklist: Patient identified, Emergency Drugs available, Suction available and Patient being monitored Patient Re-evaluated:Patient Re-evaluated prior to induction Oxygen Delivery Method: Circle System Utilized Preoxygenation: Pre-oxygenation with 100% oxygen Induction Type: IV induction Ventilation: Mask ventilation without difficulty LMA: LMA inserted LMA Size: 4.0 Number of attempts: 1 Airway Equipment and Method: Bite block Placement Confirmation: positive ETCO2 Tube secured with: Tape Dental Injury: Teeth and Oropharynx as per pre-operative assessment

## 2021-04-14 NOTE — Progress Notes (Signed)
We did get the CT scan on him yesterday.  It does show adenopathy.  There is CT scan does show adenopathy in the chest and abdomen/pelvis.  I will think that there are bulky lymph nodes.  I am really not surprised by the results of the scan.  The scan did show what looks like he has a retained hematoma over the right femoral vessels.  His white cell count is 147,000.  Hemoglobin 8.7.  Platelet count 141,000.  His BUN is 22 creatinine 0.99.  I am not sure when he is going to have the right AKA surgery.  I do not see any issues with him having surgery.  His IgG level 749 mg/dL.  This should be adequate for his immune system.  His hemoglobin is coming up a little bit.  His vital signs are all stable.  Temperature 99.  Pulse 69.  Blood pressure 127/60.  Overall, there is no change in his physical exam.  He is on anticoagulation with heparin.  I guess the neck step is for him to have surgery for his leg.  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  We are still awaiting the pathology results from the lymph node biopsy and the flow cytometry on his peripheral blood.  We will see about sending off prognostic markers for CLL.  This could indicate what type of therapy might be best for him.  Lattie Haw, MD  1 John 1:7

## 2021-04-14 NOTE — Progress Notes (Signed)
[place] for IV heparin Indication: DVT  No Known Allergies  Patient Measurements: Weight: 83.9 kg (184 lb 15.5 oz) Heparin Dosing Weight: TBW  Vital Signs: Temp: 99 F (37.2 C) (10/21 0355) Temp Source: Axillary (10/21 0355) BP: 127/60 (10/21 0355) Pulse Rate: 69 (10/21 0355)  Labs: Recent Labs    04/12/21 0512 04/12/21 1425 04/13/21 0515 04/13/21 1924 04/14/21 0357  HGB 7.3*   < > 8.3* 9.7* 8.7*  HCT 22.1*   < > 25.0* 29.9* 26.7*  PLT 141*  --  138*  --  141*  HEPARINUNFRC <0.10*  --  <0.10* 0.13* 0.38  CREATININE 1.69*  --  1.12  --  0.99   < > = values in this interval not displayed.     CrCl cannot be calculated (Unknown ideal weight.).   Medical History: Past Medical History:  Diagnosis Date   Colon polyps    Dementia ([place])    Diabetic retinopathy ([place])    DM2 (diabetes mellitus, type 2) ([place])    HTN (hypertension)     Medications:  Medications Prior to Admission  Medication Sig Dispense Refill Last Dose   Cholecalciferol 25 MCG (1000 UT) TBDP Take 1 tablet by mouth daily.   Past Week   donepezil (ARICEPT) 10 MG tablet Take 10 mg by mouth at bedtime.   Past Week   lisinopril (ZESTRIL) 5 MG tablet Take 5 mg by mouth daily.   Past Week   metFORMIN (GLUCOPHAGE) 500 MG tablet Take 500 mg by mouth 2 (two) times daily with a meal.   Past Week   Scheduled:   Chlorhexidine Gluconate Cloth  6 each Topical Daily   insulin aspart  0-15 Units Subcutaneous TID WC   insulin glargine-yfgn  10 Units Subcutaneous Daily   PRN:   Assessment: 28 yoM with PMH dementia and DM2 admitted after being found on floor by daughter after unknown length of time. R foot appears to be acutely ischemic.   Patient is now POD 3 from revascularization and fasciotomies on 10/17. No plans to pursue thrombectomy. Patient is scheduled for AKA 10/21. Extensive DVT noted on exam. Patient has been on low dose heparin dosed by vascular surgery started  post op, changed to heparin per pharmacy earlier today. Patient also with anemia requiring transfution.   Heparin level undetectable earlier today requiring rate increase to 1000 units/hr (no bolus given). Heparin level now resulting sub-therapeutic at 0.13.   10/21 AM update:  Heparin level therapeutic after rate increase  Goal of Therapy: Heparin level 0.3-0.7 units/ml Monitor platelets by anticoagulation protocol: Yes  Plan: Cont heparin at 1300 units/hr 1200 heparin level  Narda Bonds, PharmD, BCPS Clinical Pharmacist Phone: [phone]

## 2021-04-14 NOTE — Progress Notes (Signed)
[facility] [place]  Subjective  -states he is in agreement for above-knee amputation on the right   Objective (!) 166/85 79 97.9 F (36.6 C) (Oral) 20 93%  Intake/Output Summary (Last 24 hours) at 04/14/2021 0856 Last data filed at 04/14/2021 0827 Gross per 24 hour  Intake 3727.36 ml  Output 1825 ml  Net 1902.36 ml    Right calf wrapped but toes are necrotic  Laboratory Lab Results: Recent Labs    04/13/21 0515 04/13/21 1924 04/14/21 0357 04/14/21 0623  WBC 142.2*  --  147.5*  --   HGB 8.3*   < > 8.7* 9.6*  HCT 25.0*   < > 26.7* 29.2*  PLT 138*  --  141*  --    < > = values in this interval not displayed.   BMET Recent Labs    04/13/21 0515 04/14/21 0357  NA 136 135  K 4.5 4.7  CL 105 108  CO2 22 24  GLUCOSE 158* 125*  BUN 27* 22  CREATININE 1.12 0.99  CALCIUM 7.5* 7.3*    COAG No results found for: INR, PROTIME No results found for: PTT  Assessment/Planning:  75 year old male who presented on Monday night with acute right lower extremity ischemia now status post common femoral to peroneal artery bypass with right lower extremity thrombectomy and 4 compartment fasciotomies by [name].  Unfortunately he lost signals on Tuesday and ultimately after discussing with family it was elected not to proceed with further attempts at thrombectomy and limb salvage.  Plan today is right above-knee amputation.  I discussed with the patient and his daughter who are both in agreement.  Appreciate hematology oncology following for possible CLL.  Marty Heck 04/14/2021 8:56 AM --

## 2021-04-14 NOTE — Progress Notes (Signed)
PROGRESS NOTE  Scott Garrett LTJ:[id number] DOB: [birth year] DOA: 04/10/2021 PCP: [name]  HPI/Recap of past 24 hours: Scott Garrett is a 75 y.o. male with medical history significant of dementia, DM2, HTN, presents from home where he lives alone and has a caregiver who comes in 3 times a week.  Presented due to increase confusion and right foot pain.  Work-up revealed right lower extremity critical limb ischemia.  Also incidental findings of leukocytosis with WBC greater than 140.  Seen by vascular surgery, was taken to the OR emergently on 04/10/2021 by vascular surgery for Right lower extremity revascularization with fasciotomies.  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  UA positive for pyuria, started on Rocephin on 04/11/2021.  Lost Doppler signals to right foot and right calf muscles, now ischemic plan for right AKA on 04/14/21.  Hospital course complicated by acute blood loss, transfused total 2 units PRBCs for hemoglobin of 7.3 on 04/12/2021 and 04/13/2021.  04/14/2021: Post right above-the-knee amputation on 04/14/2021. Somnolent after surgery, family present at bedside.   Assessment/Plan: Principal Problem:   Critical limb ischemia of right lower extremity (HCC) Active Problems:   Dementia ([place])   DM2 (diabetes mellitus, type 2) ([place])   HTN (hypertension)   Leukocytosis  Right lower extremity critical limb ischemia status post revascularization and fasciotomies on 04/10/2021 by vascular surgery [name], lost Doppler signals, right foot and calf muscles now ischemic, plan for right AKA on 04/14/2021. Pain control. Monitor H&H  Acute blood loss anemia with ongoing heparin drip Hemoglobin 7.3 this morning from 10.3 Repeat H&H and obtain type and screen. Transfuse to maintain hemoglobin greater than 8.  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 WBC greater than 140K. Medical oncology  following Awaiting confirmation from lymph node biopsy and flow cytometry on the peripheral blood.  Presumptive UTI Urine analysis positive for pyuria Urine culture no growth.  Nonoliguric AKI, suspect multifactorial Baseline creatinine 0.90 with GFR greater than 60 Creatinine down trending 1.12 from 1.69 Continue IV fluid hydration. Continue to monitor urine output, last UO 2.1L last 24 hours Continue to avoid nephrotoxic agents/dehydration and hypotension. Repeat renal panel in the morning.  Resolved post repletion hypomagnesemia Magnesium 1.3>1.7 Repleted intravenously. Repeat level in the morning.  Acute metabolic encephalopathy in the setting of dementia likely secondary to UTI, acute illness. Continue fall precautions Soft restraints as needed to avoid pulling out IV access  Mild thrombocytopenia Platelet count downtrending 138 from 141. Continue to closely monitor.  Type 2 diabetes with hyperglycemia Hemoglobin A1c 11.5 on 04/10/2021 Continue to hold off home oral hypoglycemics Continue Semglee 10 unit daily Continue insulin sliding scale.  Lactic acidosis Lactic acid 2.8, repeat 2.4. Continue IV fluid  Dementia with behavioral disturbances Bilateral mittens in place Has pulled out 3 IVs Continue to reorient as needed    Code Status: Full code  Family Communication: None at bedside.  Disposition Plan: Likely will discharge to home with home health services once vascular surgery signs off.   Consultants: Vascular surgery  Procedures: Revascularization, fasciotomies on 04/10/2021 by vascular surgery [name].  Antimicrobials: Rocephin 2 g daily, 04/11/2021.  DVT prophylaxis: Heparin drip.  Status is: Inpatient  Patient will require at least 2 midnights for further evaluation and treatment of present condition.        Objective: Vitals:   04/14/21 1234 04/14/21 1238 04/14/21 1318 04/14/21 1615  BP: (!) 144/86  (!) 158/78 (!) 152/71   Pulse: 76  74 76 84  Resp: 20 14 16 20   Temp:  (!) 97.2 F (36.2 C) 98.1 F (36.7 C) 98.4 F (36.9 C)  TempSrc:   Axillary Oral  SpO2: 100% 100% 96% 100%  Weight:      Height:        Intake/Output Summary (Last 24 hours) at 04/14/2021 1807 Last data filed at 04/14/2021 1649 Gross per 24 hour  Intake 1907.19 ml  Output 1925 ml  Net -17.81 ml   Filed Weights   04/10/21 1930 04/12/21 0338 04/14/21 0845  Weight: 81.6 kg 83.9 kg 83.9 kg    Exam:  General: 75 y.o. year-old male well-developed well-nourished in no acute distress.  Somnolent. Cardiovascular: Regular rate and rhythm no rubs or gallops.  Respiratory: Clear to auscultation with no wheezes or rales.  Abdomen: Soft nontender normal bowel sounds present.  Musculoskeletal: Right lower extremity in surgical dressing, right toes all cool to the touch and discolored..  Trace edema in lower extremities bilaterally.   Skin: Discolored right foot. Psychiatry: Mood is appropriate for condition and setting.   Data Reviewed: CBC: Recent Labs  Lab 04/10/21 2130 04/10/21 2247 04/10/21 2307 04/11/21 0012 04/11/21 0408 04/12/21 0512 04/12/21 1425 04/12/21 2341 04/13/21 0515 04/13/21 1924 04/14/21 0357 04/14/21 0623  WBC 132.2*  --  134.0*  --  142.1* 142.3*  --   --  142.2*  --  147.5*  --   NEUTROABS 15.9*  --   --   --   --   --   --   --   --   --   --   --   HGB 11.0*   < > 10.5*   < > 10.3* 7.3*   < > 8.0* 8.3* 9.7* 8.7* 9.6*  HCT 33.7*   < > 32.4*   < > 32.2* 22.1*   < > 24.4* 25.0* 29.9* 26.7* 29.2*  MCV 90.3  --  91.3  --  90.7 88.8  --   --  88.7  --  90.2  --   PLT 158  --  142*  --  149* 141*  --   --  138*  --  141*  --    < > = values in this interval not displayed.   Basic Metabolic Panel: Recent Labs  Lab 04/10/21 1841 04/10/21 2247 04/11/21 0122 04/11/21 0408 04/12/21 0512 04/13/21 0515 04/14/21 0357  NA 137   < > 141 138 130* 136 135  K 4.7   < > 3.7 4.3 4.4 4.5 4.7  CL 99   < > 101 102 98  105 108  CO2 27  --   --  26 23 22 24   GLUCOSE 367*   < > 199* 113* 247* 158* 125*  BUN 36*   < > 30* 32* 38* 27* 22  CREATININE 1.10   < > 1.00 1.23 1.69* 1.12 0.99  CALCIUM 9.3  --   --  8.7* 7.7* 7.5* 7.3*  MG  --   --   --   --  1.3* 1.7 1.7  PHOS  --   --   --   --  2.5  --   --    < > = values in this interval not displayed.   GFR: Estimated Creatinine Clearance: 69.7 mL/min (by C-G formula based on SCr of 0.99 mg/dL). Liver Function Tests: No results for input(s): AST, ALT, ALKPHOS, BILITOT, PROT, ALBUMIN in the last 168 hours. No results for input(s): LIPASE,  AMYLASE in the last 168 hours. No results for input(s): AMMONIA in the last 168 hours. Coagulation Profile: No results for input(s): INR, PROTIME in the last 168 hours. Cardiac Enzymes: Recent Labs  Lab 04/10/21 1940 04/11/21 0408  CKTOTAL 266 257   BNP (last 3 results) No results for input(s): PROBNP in the last 8760 hours. HbA1C: No results for input(s): HGBA1C in the last 72 hours.  CBG: Recent Labs  Lab 04/13/21 1602 04/14/21 0849 04/14/21 1118 04/14/21 1319 04/14/21 1552  GLUCAP 141* 116* 123* 130* 102*   Lipid Profile: No results for input(s): CHOL, HDL, LDLCALC, TRIG, CHOLHDL, LDLDIRECT in the last 72 hours. Thyroid Function Tests: No results for input(s): TSH, T4TOTAL, FREET4, T3FREE, THYROIDAB in the last 72 hours. Anemia Panel: No results for input(s): VITAMINB12, FOLATE, FERRITIN, TIBC, IRON, RETICCTPCT in the last 72 hours. Urine analysis:    Component Value Date/Time   COLORURINE YELLOW 04/11/2021 0328   APPEARANCEUR CLOUDY (A) 04/11/2021 0328   LABSPEC 1.012 04/11/2021 0328   PHURINE 5.0 04/11/2021 0328   GLUCOSEU 50 (A) 04/11/2021 0328   HGBUR MODERATE (A) 04/11/2021 0328   BILIRUBINUR NEGATIVE 04/11/2021 0328   KETONESUR 5 (A) 04/11/2021 0328   PROTEINUR 30 (A) 04/11/2021 0328   NITRITE NEGATIVE 04/11/2021 0328   LEUKOCYTESUR LARGE (A) 04/11/2021 0328   Sepsis  Labs: @LABRCNTIP (procalcitonin:4,lacticidven:4)  ) Recent Results (from the past 240 hour(s))  Resp Panel by RT-PCR (Flu A&B, Covid) Nasopharyngeal Swab     Status: None   Collection Time: 04/10/21  6:42 PM   Specimen: Nasopharyngeal Swab; Nasopharyngeal(NP) swabs in vial transport medium  Result Value Ref Range Status   SARS Coronavirus 2 by RT PCR NEGATIVE NEGATIVE Final    Comment: (NOTE) SARS-CoV-2 target nucleic acids are NOT DETECTED.  The SARS-CoV-2 RNA is generally detectable in upper respiratory specimens during the acute phase of infection. The lowest concentration of SARS-CoV-2 viral copies this assay can detect is 138 copies/mL. A negative result does not preclude SARS-Cov-2 infection and should not be used as the sole basis for treatment or other patient management decisions. A negative result may occur with  improper specimen collection/handling, submission of specimen other than nasopharyngeal swab, presence of viral mutation(s) within the areas targeted by this assay, and inadequate number of viral copies(<138 copies/mL). A negative result must be combined with clinical observations, patient history, and epidemiological information. The expected result is Negative.  Fact Sheet for Patients:  EntrepreneurPulse.com.au  Fact Sheet for Healthcare Providers:  IncredibleEmployment.be  This test is no t yet approved or cleared by the [place] FDA and  has been authorized for detection and/or diagnosis of SARS-CoV-2 by FDA under an Emergency Use Authorization (EUA). This EUA will remain  in effect (meaning this test can be used) for the duration of the COVID-19 declaration under Section 564(b)(1) of the Act, 21 U.S.C.section 360bbb-3(b)(1), unless the authorization is terminated  or revoked sooner.       Influenza A by PCR NEGATIVE NEGATIVE Final   Influenza B by PCR NEGATIVE NEGATIVE Final    Comment: (NOTE) The Xpert  Xpress SARS-CoV-2/FLU/RSV plus assay is intended as an aid in the diagnosis of influenza from Nasopharyngeal swab specimens and should not be used as a sole basis for treatment. Nasal washings and aspirates are unacceptable for Xpert Xpress SARS-CoV-2/FLU/RSV testing.  Fact Sheet for Patients: EntrepreneurPulse.com.au  Fact Sheet for Healthcare Providers: IncredibleEmployment.be  This test is not yet approved or cleared by the [place] FDA and has  been authorized for detection and/or diagnosis of SARS-CoV-2 by FDA under an Emergency Use Authorization (EUA). This EUA will remain in effect (meaning this test can be used) for the duration of the COVID-19 declaration under Section 564(b)(1) of the Act, 21 U.S.C. section 360bbb-3(b)(1), unless the authorization is terminated or revoked.  Performed at [facility], [place] [address] [place], [place] [address]   Culture, blood (routine x 2)     Status: None (Preliminary result)   Collection Time: 04/10/21  8:55 PM   Specimen: BLOOD LEFT HAND  Result Value Ref Range Status   Specimen Description BLOOD LEFT HAND  Final   Special Requests   Final    BOTTLES DRAWN AEROBIC AND ANAEROBIC Blood Culture results may not be optimal due to an inadequate volume of blood received in culture bottles   Culture   Final    NO GROWTH 4 DAYS Performed at [facility], [place] [address], [place], [place] [address]    Report Status PENDING  Incomplete  Culture, blood (routine x 2)     Status: None (Preliminary result)   Collection Time: 04/10/21  9:40 PM   Specimen: BLOOD LEFT HAND  Result Value Ref Range Status   Specimen Description BLOOD LEFT HAND  Final   Special Requests   Final    BOTTLES DRAWN AEROBIC AND ANAEROBIC Blood Culture results may not be optimal due to an inadequate volume of blood received in culture bottles   Culture   Final    NO GROWTH 4 DAYS Performed at [facility], [place] [address], [place], [place] [address]    Report Status PENDING  Incomplete  Urine Culture     Status: None   Collection Time: 04/12/21  3:50 AM   Specimen: Urine, Clean Catch  Result Value Ref Range Status   Specimen Description URINE, CLEAN CATCH  Final   Special Requests NONE  Final   Culture   Final    NO GROWTH Performed at [facility], [place] [address], [place], [place] [address]    Report Status 04/12/2021 FINAL  Final  MRSA Next Gen by PCR, Nasal     Status: None   Collection Time: 04/13/21 12:05 PM   Specimen: Nasal Mucosa; Nasal Swab  Result Value Ref Range Status   MRSA by PCR Next Gen NOT DETECTED NOT DETECTED Final    Comment: (NOTE) The GeneXpert MRSA Assay (FDA approved for NASAL specimens only), is one component of a comprehensive MRSA colonization surveillance program. It is not intended to diagnose MRSA infection nor to guide or monitor treatment for MRSA infections. Test performance is not FDA approved in patients less than 10 years old. Performed at [facility], [place] [address], [place], [place] [address]       Studies: No results found.  Scheduled Meds:  Chlorhexidine Gluconate Cloth  6 each Topical Daily   heparin  5,000 Units Subcutaneous Q8H   insulin aspart  0-15 Units Subcutaneous TID WC   insulin glargine-yfgn  10 Units Subcutaneous Daily   nicotine  14 mg Transdermal Daily    Continuous Infusions:  sodium chloride 50 mL/hr at 04/14/21 1344   cefTRIAXone (ROCEPHIN)  IV 2 g (04/13/21 0948)     LOS: 4 days     Kayleen Memos, MD [facility] Pager [phone]  If 7PM-7AM, please contact night-coverage [email or web address] Password [facility] 04/14/2021, 6:07 PM

## 2021-04-14 NOTE — Transfer of Care (Signed)
Immediate Anesthesia Transfer of Care Note  Patient: Scott Garrett  Procedure(s) Performed: RIGHT ABOVE KNEE AMPUTATION (Right: Knee)  Patient Location: PACU  Anesthesia Type:General  Level of Consciousness: responds to stimulation  Airway & Oxygen Therapy: Patient Spontanous Breathing  Post-op Assessment: Report given to RN and Post -op Vital signs reviewed and stable  Post vital signs: Reviewed and stable  Last Vitals:  Vitals Value Taken Time  BP    Temp    Pulse 71 04/14/21 1118  Resp 16 04/14/21 1118  SpO2 94 % 04/14/21 1118  Vitals shown include unvalidated device data.  Last Pain:  Vitals:   04/14/21 0859  TempSrc:   PainSc: 0-No pain      Patients Stated Pain Goal: 2 (57/32/25 6720)  Complications: No notable events documented.

## 2021-04-15 ENCOUNTER — Encounter (HOSPITAL_COMMUNITY): Payer: Self-pay | Admitting: Vascular Surgery

## 2021-04-15 DIAGNOSIS — Z89611 Acquired absence of right leg above knee: Secondary | ICD-10-CM

## 2021-04-15 DIAGNOSIS — I70221 Atherosclerosis of native arteries of extremities with rest pain, right leg: Secondary | ICD-10-CM | POA: Diagnosis not present

## 2021-04-15 DIAGNOSIS — D62 Acute posthemorrhagic anemia: Secondary | ICD-10-CM

## 2021-04-15 LAB — GLUCOSE, CAPILLARY
Glucose-Capillary: 216 mg/dL — ABNORMAL HIGH (ref 70–99)
Glucose-Capillary: 209 mg/dL — ABNORMAL HIGH (ref 70–99)
Glucose-Capillary: 270 mg/dL — ABNORMAL HIGH (ref 70–99)
Glucose-Capillary: 169 mg/dL — ABNORMAL HIGH (ref 70–99)
Glucose-Capillary: 80 mg/dL (ref 70–99)

## 2021-04-15 LAB — BASIC METABOLIC PANEL
Anion gap: 5 (ref 5–15)
BUN: 17 mg/dL (ref 8–23)
CO2: 23 mmol/L (ref 22–32)
Calcium: 7.4 mg/dL — ABNORMAL LOW (ref 8.9–10.3)
Chloride: 104 mmol/L (ref 98–111)
Creatinine, Ser: 1 mg/dL (ref 0.61–1.24)
GFR, Estimated: >60 mL/min (ref >60)
Glucose, Bld: 206 mg/dL — ABNORMAL HIGH (ref 70–99)
Potassium: 4.7 mmol/L (ref 3.5–5.1)
Sodium: 132 mmol/L — ABNORMAL LOW (ref 135–145)

## 2021-04-15 LAB — CBC
HCT: 26.4 % — ABNORMAL LOW (ref 39.0–52.0)
Hemoglobin: 8.5 g/dL — ABNORMAL LOW (ref 13.0–17.0)
MCH: 30.1 pg (ref 26.0–34.0)
MCHC: 32.2 g/dL (ref 30.0–36.0)
MCV: 93.6 fL (ref 80.0–100.0)
Platelets: 154 10*3/uL (ref 150–400)
RBC: 2.82 MIL/uL — ABNORMAL LOW (ref 4.22–5.81)
RDW: 14.3 % (ref 11.5–15.5)
WBC: 152.9 10*3/uL (ref 4.0–10.5)
nRBC: 0 % (ref 0.0–0.2)

## 2021-04-15 LAB — CULTURE, BLOOD (ROUTINE X 2)
Culture: NO GROWTH
Culture: NO GROWTH

## 2021-04-15 NOTE — Progress Notes (Addendum)
   VASCULAR SURGERY ASSESSMENT & PLAN:   POD 1 S/P RIGHT AKA: The dressing is dry.  We will change his dressing tomorrow.  PAD admitted with acute, atop possibly chronic, right lower extremity ischemia and underwent right CFA to peroneal artery bypass with PTFE graft with RLE thrombectomy and 4 comp fasciotomies. Despite systemic heparin, patient lost doppler signals. Yesterday, underwent right AKA due to non-salvageable ischemic right lower extremity. VSS. Low grade temp (persistent and present pre-op). Dressing dry. Remove and replace tomorrow.  Anemia: likely acute blood loss and chronic etiologies. Hgb 8.5 this morning.    Profound leukocytosis suspicious for CLL   History of dementia.  DVT prophy: heparin Marcellus SUBJECTIVE:   Alert  PHYSICAL EXAM:   Vitals:   04/14/21 1615 04/14/21 2035 04/14/21 2300 04/15/21 0529  BP: (!) 152/71 (!) 142/76 127/68 122/76  Pulse: 84 83 63 84  Resp: 20 16 20 16   Temp: 98.4 F (36.9 C) 99.1 F (37.3 C) 99.3 F (37.4 C) 99.6 F (37.6 C)  TempSrc: Oral Oral Oral Oral  SpO2: 100% 99% 90% 99%  Weight:      Height:       General appearance: Awake, alert in no apparent distress Cardiac: Heart rate and rhythm are regular Respirations: Nonlabored Extremities: Right residual limb: Surgical dressing dry and intact.  LABS:   Lab Results  Component Value Date   WBC 152.9 (HH) 04/15/2021   HGB 8.5 (L) 04/15/2021   HCT 26.4 (L) 04/15/2021   MCV 93.6 04/15/2021   PLT 154 04/15/2021   Lab Results  Component Value Date   CREATININE 1.00 04/15/2021   No results found for: INR, PROTIME CBG (last 3)  Recent Labs    04/14/21 1552 04/14/21 2033 04/15/21 0605  GLUCAP 102* 78 216*    PROBLEM LIST:    Principal Problem:   Critical limb ischemia of right lower extremity (HCC) Active Problems:   Dementia (HCC)   DM2 (diabetes mellitus, type 2) (HCC)   HTN (hypertension)   Leukocytosis   CURRENT MEDS:    Chlorhexidine Gluconate Cloth  6  each Topical Daily   heparin  5,000 Units Subcutaneous Q8H   insulin aspart  0-15 Units Subcutaneous TID WC   insulin glargine-yfgn  10 Units Subcutaneous Daily   nicotine  14 mg Transdermal Daily   Barbie Banner, PA-C  Office: [phone] 04/15/2021   I have interviewed the patient and examined the patient. I agree with the findings by the PA.  Gae Gallop, MD

## 2021-04-15 NOTE — Progress Notes (Signed)
PROGRESS NOTE  Scott Garrett ZHG:[id number] DOB: December 26, 1945 DOA: 04/10/2021 PCP: [name]  HPI/Recap of past 24 hours: Scott Garrett is a 75 y.o. male with medical history significant of dementia, DM2, HTN, presents from home where he lives alone and has a caregiver who comes in 3 times a week.  Presented due to increase confusion and right foot pain.  Work-up revealed right lower extremity critical limb ischemia.  Also incidental findings of leukocytosis with WBC greater than 140.  Seen by vascular surgery, was taken to the OR emergently on 04/10/2021 by vascular surgery for Right lower extremity revascularization with fasciotomies.  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  UA positive for pyuria, started on Rocephin on 04/11/2021.  Lost Doppler signals to right foot and right calf muscles, now ischemic plan for right AKA on 04/14/21.  Hospital course complicated by acute blood loss, transfused total 2 units PRBCs for hemoglobin of 7.3 on 04/12/2021 and 04/13/2021.  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  04/15/2021: Seen and examined at his bedside.  Tender at the site of his amputation with movement.  He has no other complaints.   Assessment/Plan: Principal Problem:   Critical limb ischemia of right lower extremity (HCC) Active Problems:   Dementia (HCC)   DM2 (diabetes mellitus, type 2) (HCC)   HTN (hypertension)   Leukocytosis  Right lower extremity critical limb ischemia status post revascularization and fasciotomies on 04/10/2021 by vascular surgery [name], lost Doppler signals, right foot and calf muscles ischemic, status post right AKA on 04/14/2021 by vascular surgery [name]. Continue Pain control. Continue monitor H&H  Acute blood loss anemia with ongoing heparin drip He has received total of 2 units PRBCs transfusion during this admission. Hemoglobin 8.5 from 7.3 prior to  transfusion.  Continue to monitor H&H.  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 Follow-up pathology results from the lymph node biopsy and the flow cytometry on his peripheral blood. Appreciate medical oncology assistance  Leukocytosis/absolute lymphocytosis, suspected CLL. WBC greater than 140K. Medical oncology following Awaiting confirmation from lymph node biopsy and flow cytometry on the peripheral blood.  Ruled out UTI Urine analysis positive for pyuria Urine culture no growth.  Resolved nonoliguric AKI, suspect multifactorial Baseline creatinine 0.90 with GFR greater than 60 Creatinine peaked at 1.69 Back to baseline creatinine Currently on gentle IV fluid hydration. Continue to avoid nephrotoxic agents, dehydration and hypotension.  Resolved post repletion hypomagnesemia Magnesium 1.3>1.7 Repleted intravenously.  Acute metabolic encephalopathy in the setting of dementia likely secondary to UTI, acute illness. Continue fall precautions Soft restraints as needed to avoid pulling out IV access  Resolved mild thrombocytopenia, likely secondary to acute illness. Platelet count 154 from 138  Type 2 diabetes with hyperglycemia Hemoglobin A1c 11.5 on 04/10/2021 Continue to hold off home oral hypoglycemics Continue Semglee 10 unit daily Continue insulin sliding scale.  Lactic acidosis Lactic acid 2.8, repeat 2.4. Continue IV fluid  Dementia with behavioral disturbances Bilateral mittens in place Has pulled out 3 IVs Continue to reorient as needed    Code Status: Full code  Family Communication: None at bedside.  Disposition Plan: Likely will discharge to home with home health services once vascular surgery signs off.   Consultants: Vascular surgery  Procedures: Revascularization, fasciotomies on 04/10/2021 by vascular surgery [name].  Antimicrobials: Rocephin 2 g daily,  04/11/2021.  DVT prophylaxis:  Heparin drip.  Status is: Inpatient  Patient will require at least 2 midnights for further evaluation and treatment of present condition.        Objective: Vitals:   04/14/21 2300 04/15/21 0529 04/15/21 0723 04/15/21 1104  BP: 127/68 122/76 120/70 117/64  Pulse: 63 84 78 83  Resp: 20 16 17 17   Temp: 99.3 F (37.4 C) 99.6 F (37.6 C) 98.6 F (37 C) 99.3 F (37.4 C)  TempSrc: Oral Oral Oral Oral  SpO2: 90% 99% 98% 99%  Weight:      Height:        Intake/Output Summary (Last 24 hours) at 04/15/2021 1334 Last data filed at 04/15/2021 0945 Gross per 24 hour  Intake 840 ml  Output 1370 ml  Net -530 ml   Filed Weights   04/10/21 1930 04/12/21 0338 04/14/21 0845  Weight: 81.6 kg 83.9 kg 83.9 kg    Exam:  General: 75 y.o. year-old male well-developed well-nourished in no acute distress.  He is alert and interactive.   Cardiovascular: Regular rate and rhythm no rubs or gallops.  Respiratory: Clear to auscultation with no wheezes or rales.  Abdomen: Soft monitor normal bowel sounds present.  Musculoskeletal: Right lower extremity with right above-the-knee amputation.   Skin: No rashes noted. Psychiatry: Mood is appropriate for condition and setting.   Data Reviewed: CBC: Recent Labs  Lab 04/10/21 2130 04/10/21 2247 04/11/21 0408 04/12/21 0512 04/12/21 1425 04/13/21 0515 04/13/21 1924 04/14/21 0357 04/14/21 0623 04/15/21 0135  WBC 132.2*   < > 142.1* 142.3*  --  142.2*  --  147.5*  --  152.9*  NEUTROABS 15.9*  --   --   --   --   --   --   --   --   --   HGB 11.0*   < > 10.3* 7.3*   < > 8.3* 9.7* 8.7* 9.6* 8.5*  HCT 33.7*   < > 32.2* 22.1*   < > 25.0* 29.9* 26.7* 29.2* 26.4*  MCV 90.3   < > 90.7 88.8  --  88.7  --  90.2  --  93.6  PLT 158   < > 149* 141*  --  138*  --  141*  --  154   < > = values in this interval not displayed.   Basic Metabolic Panel: Recent Labs  Lab 04/11/21 0408 04/12/21 0512 04/13/21 0515  04/14/21 0357 04/15/21 0135  NA 138 130* 136 135 132*  K 4.3 4.4 4.5 4.7 4.7  CL 102 98 105 108 104  CO2 26 23 22 24 23   GLUCOSE 113* 247* 158* 125* 206*  BUN 32* 38* 27* 22 17  CREATININE 1.23 1.69* 1.12 0.99 1.00  CALCIUM 8.7* 7.7* 7.5* 7.3* 7.4*  MG  --  1.3* 1.7 1.7  --   PHOS  --  2.5  --   --   --    GFR: Estimated Creatinine Clearance: 69 mL/min (by C-G formula based on SCr of 1 mg/dL). Liver Function Tests: No results for input(s): AST, ALT, ALKPHOS, BILITOT, PROT, ALBUMIN in the last 168 hours. No results for input(s): LIPASE, AMYLASE in the last 168 hours. No results for input(s): AMMONIA in the last 168 hours. Coagulation Profile: No results for input(s): INR, PROTIME in the last 168 hours. Cardiac Enzymes: Recent Labs  Lab 04/10/21 1940 04/11/21 0408  CKTOTAL 266 257   BNP (last 3 results) No results for input(s): PROBNP in the last 8760 hours. HbA1C: No  results for input(s): HGBA1C in the last 72 hours.  CBG: Recent Labs  Lab 04/14/21 1552 04/14/21 2033 04/15/21 0605 04/15/21 0858 04/15/21 1059  GLUCAP 102* 78 216* 209* 270*   Lipid Profile: No results for input(s): CHOL, HDL, LDLCALC, TRIG, CHOLHDL, LDLDIRECT in the last 72 hours. Thyroid Function Tests: No results for input(s): TSH, T4TOTAL, FREET4, T3FREE, THYROIDAB in the last 72 hours. Anemia Panel: No results for input(s): VITAMINB12, FOLATE, FERRITIN, TIBC, IRON, RETICCTPCT in the last 72 hours. Urine analysis:    Component Value Date/Time   COLORURINE YELLOW 04/11/2021 0328   APPEARANCEUR CLOUDY (A) 04/11/2021 0328   LABSPEC 1.012 04/11/2021 0328   PHURINE 5.0 04/11/2021 0328   GLUCOSEU 50 (A) 04/11/2021 0328   HGBUR MODERATE (A) 04/11/2021 0328   BILIRUBINUR NEGATIVE 04/11/2021 0328   KETONESUR 5 (A) 04/11/2021 0328   PROTEINUR 30 (A) 04/11/2021 0328   NITRITE NEGATIVE 04/11/2021 0328   LEUKOCYTESUR LARGE (A) 04/11/2021 0328   Sepsis  Labs: @LABRCNTIP (procalcitonin:4,lacticidven:4)  ) Recent Results (from the past 240 hour(s))  Resp Panel by RT-PCR (Flu A&B, Covid) Nasopharyngeal Swab     Status: None   Collection Time: 04/10/21  6:42 PM   Specimen: Nasopharyngeal Swab; Nasopharyngeal(NP) swabs in vial transport medium  Result Value Ref Range Status   SARS Coronavirus 2 by RT PCR NEGATIVE NEGATIVE Final    Comment: (NOTE) SARS-CoV-2 target nucleic acids are NOT DETECTED.  The SARS-CoV-2 RNA is generally detectable in upper respiratory specimens during the acute phase of infection. The lowest concentration of SARS-CoV-2 viral copies this assay can detect is 138 copies/mL. A negative result does not preclude SARS-Cov-2 infection and should not be used as the sole basis for treatment or other patient management decisions. A negative result may occur with  improper specimen collection/handling, submission of specimen other than nasopharyngeal swab, presence of viral mutation(s) within the areas targeted by this assay, and inadequate number of viral copies(<138 copies/mL). A negative result must be combined with clinical observations, patient history, and epidemiological information. The expected result is Negative.  Fact Sheet for Patients:  EntrepreneurPulse.com.au  Fact Sheet for Healthcare Providers:  IncredibleEmployment.be  This test is no t yet approved or cleared by the [place] FDA and  has been authorized for detection and/or diagnosis of SARS-CoV-2 by FDA under an Emergency Use Authorization (EUA). This EUA will remain  in effect (meaning this test can be used) for the duration of the COVID-19 declaration under Section 564(b)(1) of the Act, 21 U.S.C.section 360bbb-3(b)(1), unless the authorization is terminated  or revoked sooner.       Influenza A by PCR NEGATIVE NEGATIVE Final   Influenza B by PCR NEGATIVE NEGATIVE Final    Comment: (NOTE) The Xpert  Xpress SARS-CoV-2/FLU/RSV plus assay is intended as an aid in the diagnosis of influenza from Nasopharyngeal swab specimens and should not be used as a sole basis for treatment. Nasal washings and aspirates are unacceptable for Xpert Xpress SARS-CoV-2/FLU/RSV testing.  Fact Sheet for Patients: EntrepreneurPulse.com.au  Fact Sheet for Healthcare Providers: IncredibleEmployment.be  This test is not yet approved or cleared by the [place] FDA and has been authorized for detection and/or diagnosis of SARS-CoV-2 by FDA under an Emergency Use Authorization (EUA). This EUA will remain in effect (meaning this test can be used) for the duration of the COVID-19 declaration under Section 564(b)(1) of the Act, 21 U.S.C. section 360bbb-3(b)(1), unless the authorization is terminated or revoked.  Performed at [facility], [place]  [address], [place], [place] [address]   Culture, blood (routine x 2)     Status: None   Collection Time: 04/10/21  8:55 PM   Specimen: BLOOD LEFT HAND  Result Value Ref Range Status   Specimen Description BLOOD LEFT HAND  Final   Special Requests   Final    BOTTLES DRAWN AEROBIC AND ANAEROBIC Blood Culture results may not be optimal due to an inadequate volume of blood received in culture bottles   Culture   Final    NO GROWTH 5 DAYS Performed at [facility], [place] [address], [place], [place] [address]    Report Status 04/15/2021 FINAL  Final  Culture, blood (routine x 2)     Status: None   Collection Time: 04/10/21  9:40 PM   Specimen: BLOOD LEFT HAND  Result Value Ref Range Status   Specimen Description BLOOD LEFT HAND  Final   Special Requests   Final    BOTTLES DRAWN AEROBIC AND ANAEROBIC Blood Culture results may not be optimal due to an inadequate volume of blood received in culture bottles   Culture   Final    NO GROWTH 5 DAYS Performed at [facility], [place] [address] [place],  [place] [address]    Report Status 04/15/2021 FINAL  Final  Urine Culture     Status: None   Collection Time: 04/12/21  3:50 AM   Specimen: Urine, Clean Catch  Result Value Ref Range Status   Specimen Description URINE, CLEAN CATCH  Final   Special Requests NONE  Final   Culture   Final    NO GROWTH Performed at [facility], [place] [address] [place], [place] [address]    Report Status 04/12/2021 FINAL  Final  MRSA Next Gen by PCR, Nasal     Status: None   Collection Time: 04/13/21 12:05 PM   Specimen: Nasal Mucosa; Nasal Swab  Result Value Ref Range Status   MRSA by PCR Next Gen NOT DETECTED NOT DETECTED Final    Comment: (NOTE) The GeneXpert MRSA Assay (FDA approved for NASAL specimens only), is one component of a comprehensive MRSA colonization surveillance program. It is not intended to diagnose MRSA infection nor to guide or monitor treatment for MRSA infections. Test performance is not FDA approved in patients less than 87 years old. Performed at [facility], [place] [address] [place], [place] [address]       Studies: No results found.  Scheduled Meds:  Chlorhexidine Gluconate Cloth  6 each Topical Daily   heparin  5,000 Units Subcutaneous Q8H   insulin aspart  0-15 Units Subcutaneous TID WC   insulin glargine-yfgn  10 Units Subcutaneous Daily   nicotine  14 mg Transdermal Daily    Continuous Infusions:  sodium chloride 50 mL/hr at 04/14/21 1344   cefTRIAXone (ROCEPHIN)  IV 2 g (04/15/21 0928)     LOS: 5 days     Kayleen Memos, MD [facility] Pager [phone]  If 7PM-7AM, please contact night-coverage [email or web address] Password [email or web address] 04/15/2021, 1:34 PM

## 2021-04-15 NOTE — Progress Notes (Signed)
Pt has refused repositioning throughout shift.  As the day has progressed Pt has continued to lean further to the left putting increased pressure on left arm and shoulder.  Finally able to convince Pt to allow repositioning. Pain medication given prior to repositioning.  Overall Pt tolerated well and states he is currently much more comfortable.

## 2021-04-16 DIAGNOSIS — I70221 Atherosclerosis of native arteries of extremities with rest pain, right leg: Secondary | ICD-10-CM | POA: Diagnosis not present

## 2021-04-16 LAB — BASIC METABOLIC PANEL
Anion gap: 5 (ref 5–15)
BUN: 13 mg/dL (ref 8–23)
CO2: 23 mmol/L (ref 22–32)
Calcium: 7.3 mg/dL — ABNORMAL LOW (ref 8.9–10.3)
Chloride: 107 mmol/L (ref 98–111)
Creatinine, Ser: 0.8 mg/dL (ref 0.61–1.24)
GFR, Estimated: >60 mL/min (ref >60)
Glucose, Bld: 102 mg/dL — ABNORMAL HIGH (ref 70–99)
Potassium: 4.9 mmol/L (ref 3.5–5.1)
Sodium: 135 mmol/L (ref 135–145)

## 2021-04-16 LAB — GLUCOSE, CAPILLARY
Glucose-Capillary: 104 mg/dL — ABNORMAL HIGH (ref 70–99)
Glucose-Capillary: 186 mg/dL — ABNORMAL HIGH (ref 70–99)
Glucose-Capillary: 150 mg/dL — ABNORMAL HIGH (ref 70–99)
Glucose-Capillary: 124 mg/dL — ABNORMAL HIGH (ref 70–99)

## 2021-04-16 LAB — CBC
HCT: 23.9 % — ABNORMAL LOW (ref 39.0–52.0)
Hemoglobin: 7.6 g/dL — ABNORMAL LOW (ref 13.0–17.0)
MCH: 29.6 pg (ref 26.0–34.0)
MCHC: 31.8 g/dL (ref 30.0–36.0)
MCV: 93 fL (ref 80.0–100.0)
Platelets: 151 10*3/uL (ref 150–400)
RBC: 2.57 MIL/uL — ABNORMAL LOW (ref 4.22–5.81)
RDW: 14.1 % (ref 11.5–15.5)
WBC: 149.9 10*3/uL (ref 4.0–10.5)
nRBC: 0 % (ref 0.0–0.2)

## 2021-04-16 LAB — SURGICAL PATHOLOGY

## 2021-04-16 MED ORDER — HYDROMORPHONE HCL 1 MG/ML IJ SOLN
1.0000 mg | Freq: Once | INTRAMUSCULAR | Status: DC
  Filled 2021-04-16: qty 1

## 2021-04-16 NOTE — Evaluation (Signed)
Physical Therapy Evaluation Patient Details Name: Scott Garrett MRN: [id number] DOB: 1945-10-16 Today's Date: 04/16/2021  History of Present Illness  Scott Garrett is a 75 y.o. male who presented 10/17 due to increase confusion and right foot pain.  Work-up revealed right lower extremity critical limb ischemia. 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 Pt with medical history significant of dementia, DM2, HTN.  Clinical Impression  Patient presents with generalized weakness, baseline cognitive deficits, post surgical deficits LLE, impaired balance and impaired mobility s/p above. Pt not able to provide accurate PLOF/history due to cognition; reports living alone in second floor apt and has a caregiver come in 3x/week to assist with ADLs/IADLs? Sisters in room reporting conflicting info. Today, pt requires assist of 2 for bed mobility, hesitant to mobilize due to pain. Not able to scoot along side bed or move residual limb well. Needs constant redirection and cues to stay focused on tasks. Uses humor and jokes to mask deficits. Encouraged desensitization and there ex. Would benefit from SNF to maximize independence and mobility prior to return home.       Recommendations for follow up therapy are one component of a multi-disciplinary discharge planning process, led by the attending physician.  Recommendations may be updated based on patient status, additional functional criteria and insurance authorization.  Follow Up Recommendations SNF;Supervision for mobility/OOB    Equipment Recommendations  Other (comment) (defer to SNF, w/c, RW and BSC drop arm if going home)    Recommendations for Other Services       Precautions / Restrictions Precautions Precautions: Fall Precaution Comments: new AKA Restrictions Weight Bearing Restrictions: Yes RLE Weight Bearing: Non weight bearing      Mobility  Bed Mobility Overal bed mobility: Needs  Assistance Bed Mobility: Rolling;Sidelying to Sit;Sit to Supine Rolling: Min assist;+2 for safety/equipment Sidelying to sit: Max assist;+2 for physical assistance   Sit to supine: Max assist;+2 for physical assistance;+2 for safety/equipment   General bed mobility comments: verbal cues for positioning and sequencing throughout, max physical assist for elevating trunk and adjusting hips towards EOB    Transfers                 General transfer comment: deferred due to pain and safety  Ambulation/Gait                Stairs            Wheelchair Mobility    Modified Rankin (Stroke Patients Only)       Balance Overall balance assessment: Needs assistance Sitting-balance support: Single extremity supported Sitting balance-Leahy Scale: Fair Sitting balance - Comments: fluctuated from poor-fair, once sitting for a few minutes pt was able to sit unsupported                                     Pertinent Vitals/Pain Pain Assessment: Faces Faces Pain Scale: Hurts even more Pain Location: right residual limb Pain Descriptors / Indicators: Sharp;Discomfort;Grimacing Pain Intervention(s): Monitored during session;Premedicated before session;Limited activity within patient's tolerance;Repositioned    Home Living Family/patient expects to be discharged to:: Private residence Living Arrangements: Alone Available Help at Discharge: Family;Available PRN/intermittently Type of Home: Apartment Home Access: Stairs to enter   [employer] of Steps: 2nd floor Home Layout: One level Home Equipment: None Additional Comments: PLOF and home set up difficult to determine between pt and two  sisters giving conflicting information at the time of eval    Prior Function Level of Independence: Needs assistance         Comments: Per pt's sister he has been mobilizign well without AD, caregiveer assists with ADLs as needed     Hand Dominance         Extremity/Trunk Assessment   Upper Extremity Assessment Upper Extremity Assessment: Defer to OT evaluation    Lower Extremity Assessment Lower Extremity Assessment: Generalized weakness;RLE deficits/detail RLE Deficits / Details: post surgical deficits- limited AROM throughout RLE: Unable to fully assess due to pain RLE Sensation: decreased light touch    Cervical / Trunk Assessment Cervical / Trunk Assessment: Normal  Communication   Communication: No difficulties  Cognition Arousal/Alertness: Awake/alert Behavior During Therapy: WFL for tasks assessed/performed Overall Cognitive Status: History of cognitive impairments - at baseline                                 General Comments: pt required constant re-directing to functional tasks. He was perseverating on wanting to watch TV throughout the session, and using sarcastic jokes. he followed simple one steps commands with increased time and cues, needing redirection frequently.      General Comments General comments (skin integrity, edema, etc.): VSS on RA, 2 sisters present.    Exercises Amputee Exercises Hip Extension: AAROM;Right (x3)   Assessment/Plan    PT Assessment Patient needs continued PT services  PT Problem List Decreased range of motion;Decreased strength;Decreased mobility;Decreased safety awareness;Decreased skin integrity;Decreased cognition;Pain;Impaired sensation;Decreased balance;Decreased knowledge of use of DME;Decreased activity tolerance;Decreased knowledge of precautions       PT Treatment Interventions Therapeutic exercise;Wheelchair mobility training;Patient/family education;Therapeutic activities;Functional mobility training;Balance training;Gait training;DME instruction    PT Goals (Current goals can be found in the Care Plan section)  Acute Rehab PT Goals Patient Stated Goal: watch cartoons PT Goal Formulation: With patient Time For Goal Achievement: 04/30/21 Potential to  Achieve Goals: Fair    Frequency Min 3X/week   Barriers to discharge Inaccessible home environment;Decreased caregiver support stairs to enter apt    Co-evaluation PT/OT/SLP Co-Evaluation/Treatment: Yes Reason for Co-Treatment: Complexity of the patient's impairments (multi-system involvement);Necessary to address cognition/behavior during functional activity;For patient/therapist safety;To address functional/ADL transfers PT goals addressed during session: Mobility/safety with mobility;Balance;Strengthening/ROM OT goals addressed during session: ADL's and self-care;Proper use of Adaptive equipment and DME       AM-PAC PT "6 Clicks" Mobility  Outcome Measure Help needed turning from your back to your side while in a flat bed without using bedrails?: A Lot Help needed moving from lying on your back to sitting on the side of a flat bed without using bedrails?: A Lot Help needed moving to and from a bed to a chair (including a wheelchair)?: Total Help needed standing up from a chair using your arms (e.g., wheelchair or bedside chair)?: Total Help needed to walk in hospital room?: Total Help needed climbing 3-5 steps with a railing? : Total 6 Click Score: 8    End of Session   Activity Tolerance: Patient limited by pain;Patient tolerated treatment well Patient left: in bed;with call bell/phone within reach;with bed alarm set;with family/visitor present Nurse Communication: Mobility status;Need for lift equipment PT Visit Diagnosis: Pain;Muscle weakness (generalized) (M62.81);Difficulty in walking, not elsewhere classified (R26.2) Pain - Right/Left: Right Pain - part of body: Leg    Time: 8588-5027 PT Time Calculation (min) (ACUTE ONLY): 25 min  Charges:   PT Evaluation $PT Eval Moderate Complexity: 1 Mod          Marisa Severin, PT, DPT Acute Rehabilitation Services Pager [phone] Office [place] 04/16/2021, 3:28 PM

## 2021-04-16 NOTE — Progress Notes (Signed)
Spoke with daughter of Pt on phone and updated.

## 2021-04-16 NOTE — Progress Notes (Signed)
   VASCULAR SURGERY ASSESSMENT & PLAN:   POD 2 RIGHT AKA: I changed his dressing this morning and the incision looks good so far.  There is some serous drainage.  There is also some serous drainage from the groin incision.  His bypass was done with PTFE so we will have to keep an eye on this.  I have written for daily dressing changes.  He may have to be premedicated for this.  DVT PROPHYLAXIS: He is on subcu heparin.   ID: He is on intravenous Rocephin.  This can be discontinued from our standpoint.  SUBJECTIVE:   Pain adequately controlled.  PHYSICAL EXAM:   Vitals:   04/15/21 1535 04/15/21 2032 04/15/21 2316 04/16/21 0421  BP: 132/67 119/61 128/68 (!) 111/57  Pulse: 82 77 76 74  Resp: 16 19 16 19   Temp: 99.6 F (37.6 C) 99.5 F (37.5 C) 98.6 F (37 C) 98.9 F (37.2 C)  TempSrc: Oral Oral Oral Oral  SpO2: 99% 97% 100% 100%  Weight:      Height:       I changed his dressing this morning and the incision looks fine.  There are some serous drainage.  It was redressed.  LABS:   Lab Results  Component Value Date   WBC 149.9 (HH) 04/16/2021   HGB 7.6 (L) 04/16/2021   HCT 23.9 (L) 04/16/2021   MCV 93.0 04/16/2021   PLT 151 04/16/2021   Lab Results  Component Value Date   CREATININE 0.80 04/16/2021   Recent Labs    04/15/21 1553 04/15/21 2105 04/16/21 0614  GLUCAP 169* 80 104*    PROBLEM LIST:    Principal Problem:   Critical limb ischemia of right lower extremity (HCC) Active Problems:   Dementia (HCC)   DM2 (diabetes mellitus, type 2) (HCC)   HTN (hypertension)   Leukocytosis   CURRENT MEDS:    Chlorhexidine Gluconate Cloth  6 each Topical Daily   heparin  5,000 Units Subcutaneous Q8H    HYDROmorphone (DILAUDID) injection  1 mg Intravenous Once   insulin aspart  0-15 Units Subcutaneous TID WC   insulin glargine-yfgn  10 Units Subcutaneous Daily   nicotine  14 mg Transdermal Daily    Deitra Mayo Office: [phone] 04/16/2021

## 2021-04-16 NOTE — Progress Notes (Signed)
Called daughter of Pt and updated.

## 2021-04-16 NOTE — Progress Notes (Signed)
PROGRESS NOTE  Scott Garrett GDJ:[id number] DOB: 12-28-1945 DOA: 04/10/2021 PCP: [name]  HPI/Recap of past 24 hours: Scott Garrett is a 75 y.o. male with medical history significant of dementia, DM2, HTN, presents from home where he lives alone and has a caregiver who comes in 3 times a week.  Presented due to increase confusion and right foot pain.  Work-up revealed right lower extremity critical limb ischemia.  Also incidental findings of leukocytosis with WBC greater than 140.  Seen by vascular surgery, was taken to the OR emergently on 04/10/2021 by vascular surgery for Right lower extremity revascularization with fasciotomies.  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  UA positive for pyuria, started on Rocephin on 04/11/2021.  Lost Doppler signals to right foot and right calf muscles despite being on heparin drip, now status post right AKA on 04/14/21 by vascular surgery [name].  Hospital course complicated by acute blood loss, transfused total 2 units PRBCs for hemoglobin of 7.3 on 04/12/2021 and 04/13/2021.    04/16/2021: Seen at his bedside.  Has no new complaints.  PT evaluation with recommendation for SNF.  TOC assisting with SNF placement.   Assessment/Plan: Principal Problem:   Critical limb ischemia of right lower extremity (HCC) Active Problems:   Dementia (HCC)   DM2 (diabetes mellitus, type 2) (HCC)   HTN (hypertension)   Leukocytosis  Right lower extremity critical limb ischemia status post revascularization and fasciotomies on 04/10/2021 by vascular surgery [name], lost Doppler signals despite being on heparin drip, now status post right AKA on 04/14/2021 by vascular surgery [name]. Continue Pain control. Continue monitor H&H Completed 7 days of Rocephin.  Acute blood loss anemia with ongoing heparin drip He has received total of 2 units PRBCs transfusion  during this admission. Hemoglobin 7.6 from 8.5 posttransfusion. Continue to follow H&H.  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 Follow-up pathology results from the lymph node biopsy and the flow cytometry on his peripheral blood. Appreciate medical oncology assistance  Leukocytosis/absolute lymphocytosis, suspected CLL. WBC greater than 140K. Medical oncology following Awaiting confirmation from lymph node biopsy and flow cytometry on the peripheral blood.  Ruled out UTI Urine analysis positive for pyuria Urine culture no growth.  Resolved nonoliguric AKI, suspect multifactorial Baseline creatinine 0.90 with GFR greater than 60 Creatinine peaked at 1.69 Back to baseline creatinine Currently on gentle IV fluid hydration. Continue to avoid nephrotoxic agents, dehydration and hypotension.  Resolved post repletion hypomagnesemia Magnesium 1.3>1.7 Repleted intravenously.  Acute metabolic encephalopathy in the setting of dementia likely secondary to UTI, acute illness. Continue fall precautions Soft restraints as needed to avoid pulling out IV access  Resolved mild thrombocytopenia, likely secondary to acute illness. Platelet count 154 from 138  Type 2 diabetes with hyperglycemia Hemoglobin A1c 11.5 on 04/10/2021 Continue to hold off home oral hypoglycemics Continue Semglee 10 unit daily Continue insulin sliding scale.  Lactic acidosis Lactic acid 2.8, repeat 2.4. Continue IV fluid  Dementia with behavioral disturbances Bilateral mittens in place Has pulled out 3 IVs Continue to reorient as needed    Code Status: Full code  Family Communication: None at bedside.  Disposition Plan: Likely will discharge to home with home health services once vascular surgery signs  off.  Consultants: Vascular surgery  Procedures: Revascularization, fasciotomies on 04/10/2021 by vascular surgery [name].  Antimicrobials: Rocephin 2 g daily, 04/11/2021.  DVT prophylaxis: Heparin drip.  Status is: Inpatient  Patient will require at least 2 midnights for further evaluation and treatment of present condition.        Objective: Vitals:   04/16/21 0421 04/16/21 0904 04/16/21 1220 04/16/21 1232  BP: (!) 111/57 123/62  (!) 107/57  Pulse: 74 77  82  Resp: 19 15 15 17   Temp: 98.9 F (37.2 C) 98.5 F (36.9 C)  99.8 F (37.7 C)  TempSrc: Oral Oral  Oral  SpO2: 100% 100%  100%  Weight:      Height:        Intake/Output Summary (Last 24 hours) at 04/16/2021 1408 Last data filed at 04/16/2021 0644 Gross per 24 hour  Intake --  Output 225 ml  Net -225 ml   Filed Weights   04/10/21 1930 04/12/21 0338 04/14/21 0845  Weight: 81.6 kg 83.9 kg 83.9 kg    Exam:  General: 75 y.o. year-old male well-developed well-nourished in no acute distress.  He is alert and interactive.   Cardiovascular: Regular rate and rhythm no rubs or gallops. Respiratory: Clear to auscultation no wheezes or rales.  Abdomen: Soft nontender normal bowel sounds present.  Musculoskeletal: Right lower extremity.  Right above-the-knee amputation.   Skin: Serous drainage from the groin incision. Psychiatry: Mood is appropriate for condition and setting.   Data Reviewed: CBC: Recent Labs  Lab 04/10/21 2130 04/10/21 2247 04/12/21 0512 04/12/21 1425 04/13/21 0515 04/13/21 1924 04/14/21 0357 04/14/21 0623 04/15/21 0135 04/16/21 0124  WBC 132.2*   < > 142.3*  --  142.2*  --  147.5*  --  152.9* 149.9*  NEUTROABS 15.9*  --   --   --   --   --   --   --   --   --   HGB 11.0*   < > 7.3*   < > 8.3* 9.7* 8.7* 9.6* 8.5* 7.6*  HCT 33.7*   < > 22.1*   < > 25.0* 29.9* 26.7* 29.2* 26.4* 23.9*  MCV 90.3   < > 88.8  --  88.7  --  90.2  --  93.6 93.0  PLT 158   < > 141*  --  138*  --   141*  --  154 151   < > = values in this interval not displayed.   Basic Metabolic Panel: Recent Labs  Lab 04/12/21 0512 04/13/21 0515 04/14/21 0357 04/15/21 0135 04/16/21 0124  NA 130* 136 135 132* 135  K 4.4 4.5 4.7 4.7 4.9  CL 98 105 108 104 107  CO2 23 22 24 23 23   GLUCOSE 247* 158* 125* 206* 102*  BUN 38* 27* 22 17 13   CREATININE 1.69* 1.12 0.99 1.00 0.80  CALCIUM 7.7* 7.5* 7.3* 7.4* 7.3*  MG 1.3* 1.7 1.7  --   --   PHOS 2.5  --   --   --   --    GFR: Estimated Creatinine Clearance: 86.3 mL/min (by C-G formula based on SCr of 0.8 mg/dL). Liver Function Tests: No results for input(s): AST, ALT, ALKPHOS, BILITOT, PROT, ALBUMIN in the last 168 hours. No results for input(s): LIPASE, AMYLASE in the last 168 hours. No results for input(s): AMMONIA in the last 168 hours. Coagulation Profile: No results for input(s): INR, PROTIME in the last 168 hours. Cardiac Enzymes: Recent Labs  Lab 04/10/21 1940 04/11/21  0408  CKTOTAL 266 257   BNP (last 3 results) No results for input(s): PROBNP in the last 8760 hours. HbA1C: No results for input(s): HGBA1C in the last 72 hours.  CBG: Recent Labs  Lab 04/15/21 1059 04/15/21 1553 04/15/21 2105 04/16/21 0614 04/16/21 1219  GLUCAP 270* 169* 80 104* 186*   Lipid Profile: No results for input(s): CHOL, HDL, LDLCALC, TRIG, CHOLHDL, LDLDIRECT in the last 72 hours. Thyroid Function Tests: No results for input(s): TSH, T4TOTAL, FREET4, T3FREE, THYROIDAB in the last 72 hours. Anemia Panel: No results for input(s): VITAMINB12, FOLATE, FERRITIN, TIBC, IRON, RETICCTPCT in the last 72 hours. Urine analysis:    Component Value Date/Time   COLORURINE YELLOW 04/11/2021 0328   APPEARANCEUR CLOUDY (A) 04/11/2021 0328   LABSPEC 1.012 04/11/2021 0328   PHURINE 5.0 04/11/2021 0328   GLUCOSEU 50 (A) 04/11/2021 0328   HGBUR MODERATE (A) 04/11/2021 0328   BILIRUBINUR NEGATIVE 04/11/2021 0328   KETONESUR 5 (A) 04/11/2021 0328   PROTEINUR  30 (A) 04/11/2021 0328   NITRITE NEGATIVE 04/11/2021 0328   LEUKOCYTESUR LARGE (A) 04/11/2021 0328   Sepsis Labs: @LABRCNTIP (procalcitonin:4,lacticidven:4)  ) Recent Results (from the past 240 hour(s))  Resp Panel by RT-PCR (Flu A&B, Covid) Nasopharyngeal Swab     Status: None   Collection Time: 04/10/21  6:42 PM   Specimen: Nasopharyngeal Swab; Nasopharyngeal(NP) swabs in vial transport medium  Result Value Ref Range Status   SARS Coronavirus 2 by RT PCR NEGATIVE NEGATIVE Final    Comment: (NOTE) SARS-CoV-2 target nucleic acids are NOT DETECTED.  The SARS-CoV-2 RNA is generally detectable in upper respiratory specimens during the acute phase of infection. The lowest concentration of SARS-CoV-2 viral copies this assay can detect is 138 copies/mL. A negative result does not preclude SARS-Cov-2 infection and should not be used as the sole basis for treatment or other patient management decisions. A negative result may occur with  improper specimen collection/handling, submission of specimen other than nasopharyngeal swab, presence of viral mutation(s) within the areas targeted by this assay, and inadequate number of viral copies(<138 copies/mL). A negative result must be combined with clinical observations, patient history, and epidemiological information. The expected result is Negative.  Fact Sheet for Patients:  EntrepreneurPulse.com.au  Fact Sheet for Healthcare Providers:  IncredibleEmployment.be  This test is no t yet approved or cleared by the [place] FDA and  has been authorized for detection and/or diagnosis of SARS-CoV-2 by FDA under an Emergency Use Authorization (EUA). This EUA will remain  in effect (meaning this test can be used) for the duration of the COVID-19 declaration under Section 564(b)(1) of the Act, 21 U.S.C.section 360bbb-3(b)(1), unless the authorization is terminated  or revoked sooner.       Influenza A  by PCR NEGATIVE NEGATIVE Final   Influenza B by PCR NEGATIVE NEGATIVE Final    Comment: (NOTE) The Xpert Xpress SARS-CoV-2/FLU/RSV plus assay is intended as an aid in the diagnosis of influenza from Nasopharyngeal swab specimens and should not be used as a sole basis for treatment. Nasal washings and aspirates are unacceptable for Xpert Xpress SARS-CoV-2/FLU/RSV testing.  Fact Sheet for Patients: EntrepreneurPulse.com.au  Fact Sheet for Healthcare Providers: IncredibleEmployment.be  This test is not yet approved or cleared by the [place] FDA and has been authorized for detection and/or diagnosis of SARS-CoV-2 by FDA under an Emergency Use Authorization (EUA). This EUA will remain in effect (meaning this test can be used) for the duration of the COVID-19 declaration under Section 564(b)(1)  of the Act, 21 U.S.C. section 360bbb-3(b)(1), unless the authorization is terminated or revoked.  Performed at [facility], [place] [address], [place], [place] [address]   Culture, blood (routine x 2)     Status: None   Collection Time: 04/10/21  8:55 PM   Specimen: BLOOD LEFT HAND  Result Value Ref Range Status   Specimen Description BLOOD LEFT HAND  Final   Special Requests   Final    BOTTLES DRAWN AEROBIC AND ANAEROBIC Blood Culture results may not be optimal due to an inadequate volume of blood received in culture bottles   Culture   Final    NO GROWTH 5 DAYS Performed at [facility], [place] [address], [place], [place] [address]    Report Status 04/15/2021 FINAL  Final  Culture, blood (routine x 2)     Status: None   Collection Time: 04/10/21  9:40 PM   Specimen: BLOOD LEFT HAND  Result Value Ref Range Status   Specimen Description BLOOD LEFT HAND  Final   Special Requests   Final    BOTTLES DRAWN AEROBIC AND ANAEROBIC Blood Culture results may not be optimal due to an inadequate volume of blood received in culture bottles    Culture   Final    NO GROWTH 5 DAYS Performed at [facility], [place] [address] [place], [place] [address]    Report Status 04/15/2021 FINAL  Final  Urine Culture     Status: None   Collection Time: 04/12/21  3:50 AM   Specimen: Urine, Clean Catch  Result Value Ref Range Status   Specimen Description URINE, CLEAN CATCH  Final   Special Requests NONE  Final   Culture   Final    NO GROWTH Performed at [facility], [place] [address], [place], [place] [address]    Report Status 04/12/2021 FINAL  Final  MRSA Next Gen by PCR, Nasal     Status: None   Collection Time: 04/13/21 12:05 PM   Specimen: Nasal Mucosa; Nasal Swab  Result Value Ref Range Status   MRSA by PCR Next Gen NOT DETECTED NOT DETECTED Final    Comment: (NOTE) The GeneXpert MRSA Assay (FDA approved for NASAL specimens only), is one component of a comprehensive MRSA colonization surveillance program. It is not intended to diagnose MRSA infection nor to guide or monitor treatment for MRSA infections. Test performance is not FDA approved in patients less than 3 years old. Performed at [facility], [place] [address] [place], [place] [address]       Studies: No results found.  Scheduled Meds:  Chlorhexidine Gluconate Cloth  6 each Topical Daily   heparin  5,000 Units Subcutaneous Q8H    HYDROmorphone (DILAUDID) injection  1 mg Intravenous Once   insulin aspart  0-15 Units Subcutaneous TID WC   insulin glargine-yfgn  10 Units Subcutaneous Daily   nicotine  14 mg Transdermal Daily    Continuous Infusions:  cefTRIAXone (ROCEPHIN)  IV 2 g (04/16/21 0920)     LOS: 6 days     Kayleen Memos, MD [facility] Pager [phone]  If 7PM-7AM, please contact night-coverage [email or web address] Password [email or web address] 04/16/2021, 2:08 PM

## 2021-04-16 NOTE — Evaluation (Signed)
Occupational Therapy Evaluation Patient Details Name: Scott Garrett MRN: [id number] DOB: Mar 12, 1946 Today's Date: 04/16/2021   History of Present Illness Theadore Garrett is a 75 y.o. male who presented 10/17 due to increase confusion and right foot pain.  Work-up revealed right lower extremity critical limb ischemia. 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 Pt with medical history significant of dementia, DM2, HTN, presents from home where he lives alone and has a caregiver who comes in 3 times a week   Clinical Impression   Royal required assistance prior to the above admission, he had PCAs 3x/wk. Per his sister, he lives alone in a 2nd floor apartment. Pt is currently required min guard-max A +2 for all ADLs at bed level, and up to max A +2 for bed mobility. Pt is limited by pain, cognition, activity tolerance, balance, and decreased insight into safety. He would benefit from OT acutely. Recommend d/c to SNF to continued therapy.     Recommendations for follow up therapy are one component of a multi-disciplinary discharge planning process, led by the attending physician.  Recommendations may be updated based on patient status, additional functional criteria and insurance authorization.   Follow Up Recommendations  SNF;Supervision/Assistance - 24 hour    Equipment Recommendations  None recommended by OT - defer to next venue of care      Precautions / Restrictions Precautions Precautions: Fall Precaution Comments: new AKA Restrictions Weight Bearing Restrictions: Yes RLE Weight Bearing: Non weight bearing      Mobility Bed Mobility Overal bed mobility: Needs Assistance Bed Mobility: Rolling;Sidelying to Sit;Sit to Supine Rolling: Min assist;+2 for safety/equipment Sidelying to sit: Max assist;+2 for physical assistance   Sit to supine: Max assist;+2 for physical assistance;+2 for safety/equipment   General bed mobility  comments: verbal cues for positioning and sequencing throughout, max physical assist for elevating trunk and adjusting hips towards EOB    Transfers                 General transfer comment: deferred due to pain and safety    Balance Overall balance assessment: Needs assistance Sitting-balance support: Single extremity supported Sitting balance-Leahy Scale: Fair Sitting balance - Comments: fluctuated from poor-fair, once sitting for a few minutes pt was able to sit unsupported                                   ADL either performed or assessed with clinical judgement   ADL Overall ADL's : Needs assistance/impaired Eating/Feeding: Independent;Sitting   Grooming: Set up;Sitting   Upper Body Bathing: Minimal assistance;Sitting   Lower Body Bathing: Maximal assistance;+2 for physical assistance;+2 for safety/equipment;Bed level   Upper Body Dressing : Set up;Sitting   Lower Body Dressing: Maximal assistance;+2 for physical assistance;+2 for safety/equipment;Bed level   Toilet Transfer: Total assistance   Toileting- Clothing Manipulation and Hygiene: Maximal assistance;+2 for physical assistance;+2 for safety/equipment;Sit to/from stand       Functional mobility during ADLs: Maximal assistance;+2 for physical assistance;+2 for safety/equipment General ADL Comments: bed level this session due to pain and safety. pt is limited by cognition, pain, and decreased insight to safety and deficts.     Vision Patient Visual Report: No change from baseline Vision Assessment?: No apparent visual deficits     Perception     Praxis      Pertinent Vitals/Pain Pain Assessment: Faces Faces Pain Scale:  Hurts even more Pain Location: sx site Pain Descriptors / Indicators: Sharp;Discomfort;Grimacing Pain Intervention(s): Monitored during session;Premedicated before session;Limited activity within patient's tolerance     Hand Dominance     Extremity/Trunk  Assessment Upper Extremity Assessment Upper Extremity Assessment: Difficult to assess due to impaired cognition (Extremely limited overhead ROM, decrased coordination)   Lower Extremity Assessment Lower Extremity Assessment: Defer to PT evaluation   Cervical / Trunk Assessment Cervical / Trunk Assessment: Normal   Communication Communication Communication: No difficulties   Cognition Arousal/Alertness: Awake/alert Behavior During Therapy: WFL for tasks assessed/performed Overall Cognitive Status: History of cognitive impairments - at baseline                                 General Comments: pt required constant re-directing to functioanl tasks. He was perseverating on wanting to watch TV throughout the session, and using sarcastic jokes. he folloed simple one steps commands with icnrased time and cues   General Comments  VSS on RA, pts 2 sisters present    Exercises     Shoulder Instructions      Home Living Family/patient expects to be discharged to:: Private residence Living Arrangements: Alone Available Help at Discharge: Family Type of Home: Apartment Home Access: Stairs to enter Technical brewer of Steps: 2nd floor   Home Layout: One level               Home Equipment: None   Additional Comments: PLOF and home set up difficult to determine between pt and two sisters giving conflicting information at the time of eval      Prior Functioning/Environment Level of Independence: Needs assistance        Comments: Per pt's sister he has been mobilizign well without AD, caregiveer assists with ADLs as needed        OT Problem List: Decreased strength;Decreased range of motion;Decreased activity tolerance;Impaired balance (sitting and/or standing);Decreased safety awareness;Decreased knowledge of use of DME or AE;Decreased knowledge of precautions;Pain      OT Treatment/Interventions:      OT Goals(Current goals can be found in the care  plan section) Acute Rehab OT Goals Patient Stated Goal: watch cartons OT Goal Formulation: With patient Time For Goal Achievement: 04/16/21 Potential to Achieve Goals: Fair ADL Goals Pt Will Perform Lower Body Bathing: with mod assist;sit to/from stand Pt Will Perform Lower Body Dressing: with mod assist;sit to/from stand Pt Will Transfer to Toilet: with mod assist;stand pivot transfer;bedside commode Pt Will Perform Toileting - Clothing Manipulation and hygiene: with mod assist;sitting/lateral leans  OT Frequency:     Barriers to D/C:            Co-evaluation PT/OT/SLP Co-Evaluation/Treatment: Yes Reason for Co-Treatment: Complexity of the patient's impairments (multi-system involvement);Necessary to address cognition/behavior during functional activity;For patient/therapist safety   OT goals addressed during session: ADL's and self-care;Proper use of Adaptive equipment and DME      AM-PAC OT "6 Clicks" Daily Activity     Outcome Measure Help from another person eating meals?: None Help from another person taking care of personal grooming?: A Little Help from another person toileting, which includes using toliet, bedpan, or urinal?: Total Help from another person bathing (including washing, rinsing, drying)?: A Lot Help from another person to put on and taking off regular upper body clothing?: A Little Help from another person to put on and taking off regular lower body clothing?: A Lot 6 Click Score:  15   End of Session Nurse Communication: Mobility status  Activity Tolerance: Patient tolerated treatment well;Patient limited by pain Patient left: in bed;with call bell/phone within reach;with bed alarm set;with family/visitor present  OT Visit Diagnosis: Other abnormalities of gait and mobility (R26.89);Muscle weakness (generalized) (M62.81);Pain                Time: 9150-5697 OT Time Calculation (min): 30 min Charges:  OT General Charges $OT Visit: 1 Visit OT  Evaluation $OT Eval Moderate Complexity: 1 Mod   Greene Diodato A Mayda Shippee 04/16/2021, 2:00 PM

## 2021-04-17 LAB — GLUCOSE, CAPILLARY
Glucose-Capillary: 228 mg/dL — ABNORMAL HIGH (ref 70–99)
Glucose-Capillary: 295 mg/dL — ABNORMAL HIGH (ref 70–99)
Glucose-Capillary: 250 mg/dL — ABNORMAL HIGH (ref 70–99)
Glucose-Capillary: 192 mg/dL — ABNORMAL HIGH (ref 70–99)

## 2021-04-17 LAB — CBC
HCT: 23.9 % — ABNORMAL LOW (ref 39.0–52.0)
Hemoglobin: 7.7 g/dL — ABNORMAL LOW (ref 13.0–17.0)
MCH: 30.2 pg (ref 26.0–34.0)
MCHC: 32.2 g/dL (ref 30.0–36.0)
MCV: 93.7 fL (ref 80.0–100.0)
Platelets: 162 10*3/uL (ref 150–400)
RBC: 2.55 MIL/uL — ABNORMAL LOW (ref 4.22–5.81)
RDW: 14.3 % (ref 11.5–15.5)
WBC: 135.8 10*3/uL (ref 4.0–10.5)
nRBC: 0 % (ref 0.0–0.2)

## 2021-04-17 LAB — SURGICAL PATHOLOGY

## 2021-04-17 NOTE — Progress Notes (Signed)
Mobility Specialist: Progress Note   04/17/21 1501  Mobility  Activity Transferred:  Bed to chair  Level of Assistance +2 (takes two people)  Assistive Device None  Mobility Out of bed to chair with meals  Mobility Response Tolerated fair  Mobility performed by Mobility specialist;Other (comment) (PT Katie)  Bed Position Chair  $Mobility charge 1 Mobility   Pt required maxA to scoot use chuck pad to the recliner. Pt kept saying "do we have to rush?" As well as requesting a few minutes to adjust to sitting up. Pt is in the chair with call bell at his side.   [facility] Malakai Schoenherr Mobility Specialist Mobility Specialist Phone: [phone]

## 2021-04-17 NOTE — Progress Notes (Addendum)
HEMATOLOGY-ONCOLOGY PROGRESS NOTE  SUBJECTIVE: Scott Garrett is sitting up in bed today.  He offers no specific complaints.  He tells me that he feels well.  No family at the bedside.  PHYSICAL EXAMINATION:  Vitals:   04/16/21 2318 04/17/21 0318  BP: (!) 107/55 (!) 105/50  Pulse: 75 71  Resp: 19 18  Temp: 98.5 F (36.9 C) 98.5 F (36.9 C)  SpO2: 100% 100%   Filed Weights   04/12/21 0338 04/14/21 0845 04/17/21 0318  Weight: 83.9 kg 83.9 kg 86.4 kg    Intake/Output from previous day: 10/23 0701 - 10/24 0700 In: 960 [P.O.:960] Out: 1400 [Urine:1400]  GENERAL: Awake and alert, no distress SKIN: skin color, texture, turgor are normal, no rashes or significant lesions EYES: normal, Conjunctiva are pink and non-injected, sclera clear LUNGS: clear to auscultation and percussion with normal breathing effort HEART: regular rate & rhythm and no murmurs  ABDOMEN:abdomen soft, non-tender and normal bowel sounds EXT: R AKA, Dressing in place.  NEURO: Alert, pleasantly confused  LABORATORY DATA:  I have reviewed the data as listed CMP Latest Ref Rng & Units 04/16/2021 04/15/2021 04/14/2021  Glucose 70 - 99 mg/dL 102(H) 206(H) 125(H)  BUN 8 - 23 mg/dL 13 17 22   Creatinine 0.61 - 1.24 mg/dL 0.80 1.00 0.99  Sodium 135 - 145 mmol/L 135 132(L) 135  Potassium 3.5 - 5.1 mmol/L 4.9 4.7 4.7  Chloride 98 - 111 mmol/L 107 104 108  CO2 22 - 32 mmol/L 23 23 24   Calcium 8.9 - 10.3 mg/dL 7.3(L) 7.4(L) 7.3(L)    Lab Results  Component Value Date   WBC 135.8 (HH) 04/17/2021   HGB 7.7 (L) 04/17/2021   HCT 23.9 (L) 04/17/2021   MCV 93.7 04/17/2021   PLT 162 04/17/2021   NEUTROABS 15.9 (H) 04/10/2021    DG Chest Port 1 View  Result Date: 04/10/2021 CLINICAL DATA:  Weakness EXAM: PORTABLE CHEST 1 VIEW COMPARISON:  None. FINDINGS: Heart and mediastinal contours are within normal limits. No focal opacities or effusions. No acute bony abnormality. IMPRESSION: No active disease. Electronically  Signed   By: Rolm Baptise M.D.   On: 04/10/2021 20:31   CT CHEST ABDOMEN PELVIS WO CONTRAST  Result Date: 04/13/2021 CLINICAL DATA:  New diagnosis CLL, staging EXAM: CT CHEST, ABDOMEN AND PELVIS WITHOUT CONTRAST TECHNIQUE: Multidetector CT imaging of the chest, abdomen and pelvis was performed following the standard protocol without IV contrast. COMPARISON:  None. FINDINGS: CT CHEST FINDINGS Cardiovascular: Aortic atherosclerosis. Normal heart size. Three-vessel coronary artery calcifications. No pericardial effusion. Mediastinum/Nodes: Prominent bilateral axillary lymph nodes, largest right axillary nodes measuring up to 1.8 x 1.3 cm (series 3, image 21). No enlarged mediastinal or hilar lymph nodes. Thyroid gland, trachea, and esophagus demonstrate no significant findings. Lungs/Pleura: Mild centrilobular emphysema. Small bilateral pleural effusions and associated atelectasis or consolidation. Underlying bandlike scarring of the bilateral lung bases, right greater than left. Musculoskeletal: No chest wall mass or suspicious bone lesions identified. Anasarca. CT ABDOMEN PELVIS FINDINGS Hepatobiliary: No solid liver abnormality is seen. No gallstones, gallbladder wall thickening, or biliary dilatation. Pancreas: Unremarkable. No pancreatic ductal dilatation or surrounding inflammatory changes. Spleen: Splenomegaly, maximum span 14.8 cm. Adrenals/Urinary Tract: Adrenal glands are unremarkable. Kidneys are normal, without renal calculi, solid lesion, or hydronephrosis. Bladder is unremarkable. Stomach/Bowel: Stomach is within normal limits. Appendix appears normal. No evidence of bowel wall thickening, distention, or inflammatory changes. Vascular/Lymphatic: Aortic atherosclerosis. Enlarged bilateral iliac, pelvic sidewall, and inguinal lymph nodes, largest left iliac node measuring  2.4 x 1.5 cm (series 3, image 98), largest right pelvic sidewall lymph node measuring 2.5 x 1.2 cm (series 3, image 106), and  largest right inguinal nodes measuring 1.4 x 1.2 cm (series 3, image 122). Reproductive: No mass or other abnormality. Other: No abdominal wall hernia. Anasarca. Incision over the right femoral vessels with a subcutaneous hematoma or seroma measuring 5.4 x 3.2 cm. Scattered small air loculations within this collection (series 3, image 116). No abdominopelvic ascites. Musculoskeletal: No acute or significant osseous findings. IMPRESSION: 1. Enlarged bilateral axillary, iliac, pelvic sidewall, and inguinal lymph nodes, as detailed above and in keeping with reported diagnosis of CLL. There is no lymphadenopathy internal to the chest or in the upper abdomen. 2. Splenomegaly. 3. Incision over the right femoral vessels with a subcutaneous hematoma or seroma measuring 5.4 x 3.2 cm. Scattered small air loculations within this collection. Findings are consistent with recent biopsy. The presence or absence of infection is not established by CT. 4. Small bilateral pleural effusions and associated atelectasis or consolidation. Underlying bandlike scarring of the bilateral lung bases, right greater than left. 5. Mild emphysema. 6. Coronary artery disease. Aortic Atherosclerosis (ICD10-I70.0) and Emphysema (ICD10-J43.9). Electronically Signed   By: Delanna Ahmadi M.D.   On: 04/13/2021 16:04    ASSESSMENT AND PLAN: 1.  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  Normocytic anemia 3.  Mild thrombocytopenia 4.  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  Dementia 6.  Diabetes mellitus 7.  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  Tobacco dependence 10.  Alcohol use  Scott Garrett has persistent leukocytosis with normocytic anemia.  Flow cytometry performed  on 04/11/2021 showed atypical T-cell population which comprises 90% of all lymphocytes.  This is most consistent with a T-cell lymphoproliferative process.  Additionally, the right inguinal lymph node was also consistent with T-cell lymphoproliferative disorder.  Given his overall performance status and dementia, he is not a candidate for intensive systemic chemotherapy such as CHOP.  However, we will review his case in more detail to determine if there are any other treatment options to use in his situation.  Right now, he is still recovering from his right AKA.  PT is recommended discharge to SNF once medically stable.  We will make final recommendations regarding treatment options after additional review of this case and current clinical guidelines.  Recommendations: Continue to monitor CBC intermittently. Will make further recommendations for treatment for his T-cell lymphoproliferative disorder after further review.  No future appointments.    LOS: 7 days   Mikey Bussing, DNP, AGPCNP-BC, AOCNP 04/17/21 Scott Garrett was interviewed and examined.  He appears unchanged aside from the right leg amputation.  The pathology from the right groin lymph node and peripheral blood flow cytometry are consistent with a malignant T-cell malignancy.  The differential diagnosis includes T-cell prolymphocytic leukemia/lymphoma versus a peripheral T-cell lymphoma.  Standard treatment will include chemotherapy and potentially biologic therapy.  I will discussed the case further with pathology to confirm a specific diagnosis.  We will recommend a treatment plan after discussion with his family.  Treatment options will be impacted by his dementia.  There is no indication for urgent systemic therapy.  I will continue following with neuro in the hospital and outpatient follow-up will be scheduled at the Cancer center.  I was present for greater  than 50% of today's visit.  I performed medical decision making.

## 2021-04-17 NOTE — Progress Notes (Signed)
Inpatient Diabetes Program Recommendations  AACE/ADA: New Consensus Statement on Inpatient Glycemic Control (2015)  Target Ranges:  Prepandial:   less than 140 mg/dL      Peak postprandial:   less than 180 mg/dL (1-2 hours)      Critically ill patients:  140 - 180 mg/dL   Lab Results  Component Value Date   GLUCAP 295 (H) 04/17/2021   HGBA1C 11.5 (H) 04/10/2021   Review of Glycemic Control   Diabetes history: DM2 Outpatient Diabetes medications: Metformin 500 mg BID Current orders for Inpatient glycemic control: Semglee 10 daily, Novolog 0-15 TID with meals   Inpatient Diabetes Program Recommendations:     Insulin: Please consider increasing Semglee to 12 units daily and ordering Novolog 0-5 units QHS.  Continue to follow.   Thank you. Lorenda Peck, RD, LDN, CDE Inpatient Diabetes Coordinator [phone]

## 2021-04-17 NOTE — Progress Notes (Signed)
   VASCULAR SURGERY ASSESSMENT & PLAN:   POD 3 RIGHT AKA: I changed his dressing this morning and the incision looks good.  The right groin incision looks good with minimal drainage.  His bypass was done with PTFE so we will have to keep an eye on this.  I have written for daily dressing changes.  He may have to be premedicated for this.   DVT PROPHYLAXIS: He is on subcu heparin.    ID: He is on intravenous Rocephin.  This can be discontinued from our standpoint.  ONCOLOGY: Appreciate Dr. Gearldine Shown help. He will arrange F/U as an outpt.    SUBJECTIVE:   No complaints this morning.  PHYSICAL EXAM:   Vitals:   04/16/21 1800 04/16/21 2001 04/16/21 2318 04/17/21 0318  BP:  (!) 113/55 (!) 107/55 (!) 105/50  Pulse:  70 75 71  Resp: 20 18 19 18   Temp:  98.5 F (36.9 C) 98.5 F (36.9 C) 98.5 F (36.9 C)  TempSrc:  Oral Oral Oral  SpO2:  100% 100% 100%  Weight:    86.4 kg  Height:       His right groin incision looks good so far with minimal drainage. He is right AKA incision is healing so far with much less serous drainage.  It would be challenging to keep a dressing on this.  LABS:   Lab Results  Component Value Date   WBC 135.8 (HH) 04/17/2021   HGB 7.7 (L) 04/17/2021   HCT 23.9 (L) 04/17/2021   MCV 93.7 04/17/2021   PLT 162 04/17/2021   Lab Results  Component Value Date   CREATININE 0.80 04/16/2021   No results found for: INR, PROTIME CBG (last 3)  Recent Labs    04/16/21 1634 04/16/21 1956 04/17/21 0702  GLUCAP 150* 124* 228*    PROBLEM LIST:    Principal Problem:   Critical limb ischemia of right lower extremity (HCC) Active Problems:   Dementia (HCC)   DM2 (diabetes mellitus, type 2) (HCC)   HTN (hypertension)   Leukocytosis   CURRENT MEDS:    Chlorhexidine Gluconate Cloth  6 each Topical Daily   heparin  5,000 Units Subcutaneous Q8H    HYDROmorphone (DILAUDID) injection  1 mg Intravenous Once   insulin aspart  0-15 Units Subcutaneous TID WC    insulin glargine-yfgn  10 Units Subcutaneous Daily   nicotine  14 mg Transdermal Daily    Deitra Mayo Office: [phone] 04/17/2021

## 2021-04-17 NOTE — Progress Notes (Signed)
PROGRESS NOTE  Scott Garrett LKJ:[id number] DOB: 06/30/1945 DOA: 04/10/2021 PCP: [name]  HPI/Recap of past 24 hours: Scott Garrett is a 75 y.o. male with medical history significant of dementia, DM2, HTN, presents from home where he lives alone and has a caregiver who comes in 3 times a week.  Presented due to increase confusion and right foot pain.  Work-up revealed right lower extremity critical limb ischemia.  Also incidental findings of leukocytosis with WBC greater than 140.  Seen by vascular surgery, was taken to the OR emergently on 04/10/2021 by vascular surgery for Right lower extremity revascularization with fasciotomies.  Also seen by medical oncology for WBC>140K.  Flow cytometry done on 04/11/2021 showed atypical T-cell population which compromises 90% of all lymphocytes.  Consistent with a T-cell lymphoproliferative process.  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  UA positive for pyuria on presentation, started on Rocephin on 04/11/2021.  Lost Doppler signals to right foot and right calf muscles despite being on heparin drip, he was taken back to the OR.  Now status post right AKA on 04/14/21 by vascular surgery [name].  Hospital course complicated by acute blood loss, transfused total 2 units PRBCs for hemoglobin of 7.3 on 04/12/2021 and 04/13/2021.    04/17/2021: Seen and examined at his bedside.  He denies having any pain in his right stump.  He has no new complaints.   Assessment/Plan: Principal Problem:   Critical limb ischemia of right lower extremity (HCC) Active Problems:   Dementia (HCC)   DM2 (diabetes mellitus, type 2) (HCC)   HTN (hypertension)   Leukocytosis  Right lower extremity critical limb ischemia status post revascularization and fasciotomies on 04/10/2021 by vascular surgery [name], lost Doppler signals despite being on heparin drip, taken back to the OR, now status post right  AKA on 04/14/2021 by vascular surgery [name]. Continue Pain control. Hemoglobin downtrending 7.7, transfuse hemoglobin less than 7.5. Patient completed 7 days of Rocephin.  Acute blood loss anemia with ongoing heparin drip He has received total of 2 units PRBCs transfusion during this admission. Continue to monitor H&H.  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  Ruled out UTI Urine analysis positive for pyuria Urine culture no growth.  Resolved nonoliguric AKI, suspect multifactorial Baseline creatinine 0.90 with GFR greater than 60 Creatinine peaked at 1.69 He is back to his baseline creatinine.  Off IV fluid. Continue to encourage oral hydration.   Continue to avoid nephrotoxic agents, dehydration and hypotension.  Resolved post repletion hypomagnesemia Magnesium 1.3>1.7 Repleted intravenously.  Resolved acute metabolic encephalopathy, delirium in the setting of acute illness.   Continue fall precautions Continue to reorient as needed.  Resolved mild thrombocytopenia, likely secondary to acute illness.  Type 2 diabetes with hyperglycemia Hemoglobin A1c 11.5 on 04/10/2021 Continue to hold off home oral hypoglycemics Continue Semglee 10 unit daily Continue insulin sliding scale.  Lactic acidosis Lactic acid 2.8, repeat 2.4.  Dementia with behavioral disturbances Continue to reorient as needed.    Code Status: Full code  Family Communication: None at bedside.  Disposition Plan: Likely will discharge to home with home health services once vascular surgery signs off.   Consultants: Vascular surgery Medical oncology  Procedures: Revascularization, fasciotomies on 04/10/2021 by vascular surgery [name].  Antimicrobials: Rocephin 2 g daily, 04/11/2021.  DVT prophylaxis: Heparin drip.  Status  is: Inpatient  Patient will require at least 2 midnights for further evaluation and treatment of present condition.        Objective: Vitals:   04/16/21 2318 04/17/21 0318 04/17/21 0915 04/17/21 1203  BP: (!) 107/55 (!) 105/50 (!) 121/52 106/68  Pulse: 75 71 90 84  Resp: 19 18 18 18   Temp: 98.5 F (36.9 C) 98.5 F (36.9 C) 98.6 F (37 C) 98.1 F (36.7 C)  TempSrc: Oral Oral Oral Oral  SpO2: 100% 100% 97% 99%  Weight:  86.4 kg    Height:        Intake/Output Summary (Last 24 hours) at 04/17/2021 1519 Last data filed at 04/17/2021 0900 Gross per 24 hour  Intake 240 ml  Output 900 ml  Net -660 ml   Filed Weights   04/12/21 0338 04/14/21 0845 04/17/21 0318  Weight: 83.9 kg 83.9 kg 86.4 kg    Exam:  General: 75 y.o. year-old male well-developed well-nourished in no distress.  He is alert and interactive.   Cardiovascular: Regular rate and rhythm no rubs or gallops. Respiratory: Clear to auscultation with no wheezes or rales.  Abdomen: Soft nontender normal bowel sounds present.  Musculoskeletal: Right above-the-knee amputation.   Skin: No ulcerative lesions noted. Psychiatry: Mood is appropriate for condition and setting.   Data Reviewed: CBC: Recent Labs  Lab 04/10/21 2130 04/10/21 2247 04/13/21 0515 04/13/21 1924 04/14/21 0357 04/14/21 0623 04/15/21 0135 04/16/21 0124 04/17/21 0257  WBC 132.2*   < > 142.2*  --  147.5*  --  152.9* 149.9* 135.8*  NEUTROABS 15.9*  --   --   --   --   --   --   --   --   HGB 11.0*   < > 8.3*   < > 8.7* 9.6* 8.5* 7.6* 7.7*  HCT 33.7*   < > 25.0*   < > 26.7* 29.2* 26.4* 23.9* 23.9*  MCV 90.3   < > 88.7  --  90.2  --  93.6 93.0 93.7  PLT 158   < > 138*  --  141*  --  154 151 162   < > = values in this interval not displayed.   Basic Metabolic Panel: Recent Labs  Lab 04/12/21 0512 04/13/21 0515 04/14/21 0357 04/15/21 0135 04/16/21 0124  NA 130* 136 135 132* 135  K 4.4 4.5 4.7 4.7 4.9  CL 98 105 108 104 107  CO2 23  22 24 23 23   GLUCOSE 247* 158* 125* 206* 102*  BUN 38* 27* 22 17 13   CREATININE 1.69* 1.12 0.99 1.00 0.80  CALCIUM 7.7* 7.5* 7.3* 7.4* 7.3*  MG 1.3* 1.7 1.7  --   --   PHOS 2.5  --   --   --   --    GFR: Estimated Creatinine Clearance: 86.3 mL/min (by C-G formula based on SCr of 0.8 mg/dL). Liver Function Tests: No results for input(s): AST, ALT, ALKPHOS, BILITOT, PROT, ALBUMIN in the last 168 hours. No results for input(s): LIPASE, AMYLASE in the last 168 hours. No results for input(s): AMMONIA in the last 168 hours. Coagulation Profile: No results for input(s): INR, PROTIME in the last 168 hours. Cardiac Enzymes: Recent Labs  Lab 04/10/21 1940 04/11/21 0408  CKTOTAL 266 257   BNP (last 3 results) No results for input(s): PROBNP in the last 8760 hours. HbA1C: No results for input(s): HGBA1C in the last 72 hours.  CBG: Recent Labs  Lab 04/16/21 1219 04/16/21 1634  04/16/21 1956 04/17/21 0702 04/17/21 1201  GLUCAP 186* 150* 124* 228* 295*   Lipid Profile: No results for input(s): CHOL, HDL, LDLCALC, TRIG, CHOLHDL, LDLDIRECT in the last 72 hours. Thyroid Function Tests: No results for input(s): TSH, T4TOTAL, FREET4, T3FREE, THYROIDAB in the last 72 hours. Anemia Panel: No results for input(s): VITAMINB12, FOLATE, FERRITIN, TIBC, IRON, RETICCTPCT in the last 72 hours. Urine analysis:    Component Value Date/Time   COLORURINE YELLOW 04/11/2021 0328   APPEARANCEUR CLOUDY (A) 04/11/2021 0328   LABSPEC 1.012 04/11/2021 0328   PHURINE 5.0 04/11/2021 0328   GLUCOSEU 50 (A) 04/11/2021 0328   HGBUR MODERATE (A) 04/11/2021 0328   BILIRUBINUR NEGATIVE 04/11/2021 0328   KETONESUR 5 (A) 04/11/2021 0328   PROTEINUR 30 (A) 04/11/2021 0328   NITRITE NEGATIVE 04/11/2021 0328   LEUKOCYTESUR LARGE (A) 04/11/2021 0328   Sepsis Labs: @LABRCNTIP (procalcitonin:4,lacticidven:4)  ) Recent Results (from the past 240 hour(s))  Resp Panel by RT-PCR (Flu A&B, Covid) Nasopharyngeal  Swab     Status: None   Collection Time: 04/10/21  6:42 PM   Specimen: Nasopharyngeal Swab; Nasopharyngeal(NP) swabs in vial transport medium  Result Value Ref Range Status   SARS Coronavirus 2 by RT PCR NEGATIVE NEGATIVE Final    Comment: (NOTE) SARS-CoV-2 target nucleic acids are NOT DETECTED.  The SARS-CoV-2 RNA is generally detectable in upper respiratory specimens during the acute phase of infection. The lowest concentration of SARS-CoV-2 viral copies this assay can detect is 138 copies/mL. A negative result does not preclude SARS-Cov-2 infection and should not be used as the sole basis for treatment or other patient management decisions. A negative result may occur with  improper specimen collection/handling, submission of specimen other than nasopharyngeal swab, presence of viral mutation(s) within the areas targeted by this assay, and inadequate number of viral copies(<138 copies/mL). A negative result must be combined with clinical observations, patient history, and epidemiological information. The expected result is Negative.  Fact Sheet for Patients:  EntrepreneurPulse.com.au  Fact Sheet for Healthcare Providers:  IncredibleEmployment.be  This test is no t yet approved or cleared by the [place] FDA and  has been authorized for detection and/or diagnosis of SARS-CoV-2 by FDA under an Emergency Use Authorization (EUA). This EUA will remain  in effect (meaning this test can be used) for the duration of the COVID-19 declaration under Section 564(b)(1) of the Act, 21 U.S.C.section 360bbb-3(b)(1), unless the authorization is terminated  or revoked sooner.       Influenza A by PCR NEGATIVE NEGATIVE Final   Influenza B by PCR NEGATIVE NEGATIVE Final    Comment: (NOTE) The Xpert Xpress SARS-CoV-2/FLU/RSV plus assay is intended as an aid in the diagnosis of influenza from Nasopharyngeal swab specimens and should not be used as a sole  basis for treatment. Nasal washings and aspirates are unacceptable for Xpert Xpress SARS-CoV-2/FLU/RSV testing.  Fact Sheet for Patients: EntrepreneurPulse.com.au  Fact Sheet for Healthcare Providers: IncredibleEmployment.be  This test is not yet approved or cleared by the [place] FDA and has been authorized for detection and/or diagnosis of SARS-CoV-2 by FDA under an Emergency Use Authorization (EUA). This EUA will remain in effect (meaning this test can be used) for the duration of the COVID-19 declaration under Section 564(b)(1) of the Act, 21 U.S.C. section 360bbb-3(b)(1), unless the authorization is terminated or revoked.  Performed at [facility], [place] [address] [place], [place] [address]   Culture, blood (routine x 2)     Status: None  Collection Time: 04/10/21  8:55 PM   Specimen: BLOOD LEFT HAND  Result Value Ref Range Status   Specimen Description BLOOD LEFT HAND  Final   Special Requests   Final    BOTTLES DRAWN AEROBIC AND ANAEROBIC Blood Culture results may not be optimal due to an inadequate volume of blood received in culture bottles   Culture   Final    NO GROWTH 5 DAYS Performed at [facility], [place] [address] [place], [place] [address]    Report Status 04/15/2021 FINAL  Final  Culture, blood (routine x 2)     Status: None   Collection Time: 04/10/21  9:40 PM   Specimen: BLOOD LEFT HAND  Result Value Ref Range Status   Specimen Description BLOOD LEFT HAND  Final   Special Requests   Final    BOTTLES DRAWN AEROBIC AND ANAEROBIC Blood Culture results may not be optimal due to an inadequate volume of blood received in culture bottles   Culture   Final    NO GROWTH 5 DAYS Performed at [facility], [place] [address], [place], [place] [address]    Report Status 04/15/2021 FINAL  Final  Urine Culture     Status: None   Collection Time: 04/12/21  3:50 AM   Specimen: Urine, Clean Catch   Result Value Ref Range Status   Specimen Description URINE, CLEAN CATCH  Final   Special Requests NONE  Final   Culture   Final    NO GROWTH Performed at [facility], [place] [address], [place], [place] [address]    Report Status 04/12/2021 FINAL  Final  MRSA Next Gen by PCR, Nasal     Status: None   Collection Time: 04/13/21 12:05 PM   Specimen: Nasal Mucosa; Nasal Swab  Result Value Ref Range Status   MRSA by PCR Next Gen NOT DETECTED NOT DETECTED Final    Comment: (NOTE) The GeneXpert MRSA Assay (FDA approved for NASAL specimens only), is one component of a comprehensive MRSA colonization surveillance program. It is not intended to diagnose MRSA infection nor to guide or monitor treatment for MRSA infections. Test performance is not FDA approved in patients less than 92 years old. Performed at [facility], [place] [address], [place], [place] [address]       Studies: No results found.  Scheduled Meds:  Chlorhexidine Gluconate Cloth  6 each Topical Daily   heparin  5,000 Units Subcutaneous Q8H    HYDROmorphone (DILAUDID) injection  1 mg Intravenous Once   insulin aspart  0-15 Units Subcutaneous TID WC   insulin glargine-yfgn  10 Units Subcutaneous Daily   nicotine  14 mg Transdermal Daily    Continuous Infusions:     LOS: 7 days     Kayleen Memos, MD [facility] Pager [phone]  If 7PM-7AM, please contact night-coverage [email or web address] Password [facility] 04/17/2021, 3:19 PM

## 2021-04-17 NOTE — [place] FL2 (Signed)
[place] MEDICAID FL2 LEVEL OF CARE SCREENING TOOL     IDENTIFICATION  Patient Name: Scott Garrett Birthdate: 1946-04-29 Sex: male Admission Date (Current Location): 04/10/2021  [facility] and [place] Number:  Herbalist and Address:  [facility], [place] [address] [place], [place] [address]      Provider Number: [id number]  Attending Physician Name and Address:  Kayleen Memos, DO  Relative Name and Phone Number:       Current Level of Care: Hospital Recommended Level of Care: [place] Prior Approval Number:    Date Approved/Denied:   PASRR Number: Pending  Discharge Plan: SNF    Current Diagnoses: Patient Active Problem List   Diagnosis Date Noted   Critical limb ischemia of right lower extremity ([place]) 04/10/2021   Dementia ([place]) 04/10/2021   DM2 (diabetes mellitus, type 2) ([place]) 04/10/2021   HTN (hypertension) 04/10/2021   Leukocytosis 04/10/2021    Orientation RESPIRATION BLADDER Height & Weight     Self, Place  Normal External catheter, Incontinent Weight: 190 lb 7.6 oz (86.4 kg) Height:  5\' 11"  (180.3 cm)  BEHAVIORAL SYMPTOMS/MOOD NEUROLOGICAL BOWEL NUTRITION STATUS      Continent Diet (please see discharge summary)  AMBULATORY STATUS COMMUNICATION OF NEEDS Skin   Extensive Assist Verbally Surgical wounds (closed inscision RT Thigh, Closed incision RT Groin, closed incision RT Leg x3)                       Personal Care Assistance Level of Assistance  Bathing, Dressing, Feeding Bathing Assistance: Maximum assistance Feeding assistance: Limited assistance Dressing Assistance: Limited assistance     Functional Limitations Info  Sight, Speech, Hearing Sight Info: Adequate Hearing Info: Adequate Speech Info: Adequate    SPECIAL CARE FACTORS FREQUENCY  PT (By licensed PT), OT (By licensed OT)     PT Frequency: 5x per week OT Frequency: 5x per week            Contractures Contractures Info:  Present    Additional Factors Info  Code Status, Allergies, Insulin Sliding Scale Code Status Info: FULL Allergies Info: NKA   Insulin Sliding Scale Info: see discharge summary       Current Medications (04/17/2021):  This is the current hospital active medication list Current Facility-Administered Medications  Medication Dose Route Frequency Provider Last Rate Last Admin   acetaminophen (TYLENOL) tablet 650 mg  650 mg Oral Q6H PRN Barbie Banner, PA-C   650 mg at 04/17/21 1210   Or   acetaminophen (TYLENOL) suppository 650 mg  650 mg Rectal Q6H PRN Setzer, Edman Circle, PA-C       Chlorhexidine Gluconate Cloth 2 % PADS 6 each  6 each Topical Daily Barbie Banner, PA-C   6 each at 04/17/21 0730   heparin injection 5,000 Units  5,000 Units Subcutaneous Q8H Irene Pap N, DO   5,000 Units at 04/17/21 1212   HYDROmorphone (DILAUDID) injection 0.5-1 mg  0.5-1 mg Intravenous Q2H PRN Barbie Banner, PA-C   1 mg at 04/17/21 0502   HYDROmorphone (DILAUDID) injection 1 mg  1 mg Intravenous Once Barbie Banner, PA-C       insulin aspart (novoLOG) injection 0-15 Units  0-15 Units Subcutaneous TID WC Barbie Banner, PA-C   8 Units at 04/17/21 1210   insulin glargine-yfgn [facility]) injection 10 Units  10 Units Subcutaneous Daily Barbie Banner, PA-C   10 Units at 04/17/21 [phone]  nicotine (NICODERM CQ - dosed in mg/24 hours) patch 14 mg  14 mg Transdermal Daily Irene Pap N, DO   14 mg at 04/17/21 0844   ondansetron (ZOFRAN) tablet 4 mg  4 mg Oral Q6H PRN Barbie Banner, PA-C       Or   ondansetron [facility]) injection 4 mg  4 mg Intravenous Q6H PRN Barbie Banner, PA-C         Discharge Medications: Please see discharge summary for a list of discharge medications.  Relevant Imaging Results:  Relevant Lab Results:   Additional Information SSN [id number]  family states- has received covid vaccine but no booster  Vinie Sill, LCSW

## 2021-04-17 NOTE — Social Work (Addendum)
                                                                                                                          Re: Scott Garrett Date of Birth: 1945/08/06 Date: 04/17/2021  To Whom It May Concern:  Please be advised that the above-named patient will require a short-term nursing home stay-anticipated 30 days or less for rehabilitation and strengthening. The plan is to return home.

## 2021-04-17 NOTE — TOC Initial Note (Addendum)
Transition of Care Scott Garrett) - Initial/Assessment Note    Patient Details  Name: Scott Garrett MRN: [id number] Date of Birth: 1946/04/27  Transition of Care Scott Garrett) CM/SW Contact:    Vinie Sill, LCSW Phone Number: 04/17/2021, 4:38 PM  Clinical Narrative:                  CSW  spoke with patient's daughter,Nakisha by phone. CSW introduced self and explained role. CSW discussed PT/OT recommendation for rehab at skilled nursing facility. She states understanding of recommendations and agrees with short term rehab at [facility]. However, she is not too sure if the patient will agree, "I Know he wants to go home". She expressed she is unable to provide the level of care need for him to discharge home. She requested CSW to discuss disposition with patient after she has talked with him more.  CSW explained the SNF process. She confirmed address [place] [id number]. Scott Garrett is [place]. She is agreeable to starting the SNF search and send referrals.   She inquired about SNF, ALF and LTC- CSW answered all questions. [facility] Pending - will need to up load clinicals after MD has signed FL2 & 30 day note.  CSW will provide bed offers once available  CSW will continue to follow and assist with discharge planning.  Scott Garrett, MSW, LCSW Clinical Social Worker    Expected Discharge Plan: Skilled Nursing Facility Barriers to Discharge: Awaiting State Approval (PASRR), Insurance Authorization, Continued Medical Work up, SNF Pending bed offer   Patient Goals and CMS Choice        Expected Discharge Plan and Services Expected Discharge Plan: [place] In-house Referral: Clinical Social Work     Living arrangements for the past 2 months: Apartment                                      Prior Living Arrangements/Services Living arrangements for the past 2 months: Apartment Lives with:: Self Patient language and need for  interpreter reviewed:: No        Need for Family Participation in Patient Care: Yes (Comment) Care giver support system in place?: Yes (comment) Current home services: Other (comment) (caregiver 3x per week) Criminal Activity/Legal Involvement Pertinent to Current Situation/Hospitalization: No - Comment as needed  Activities of Daily Living Home Assistive Devices/Equipment: Cane (specify quad or straight) ADL Screening (condition at time of admission) Patient's cognitive ability adequate to safely complete daily activities?: No Is the patient deaf or have difficulty hearing?: Yes Does the patient have difficulty seeing, even when wearing glasses/contacts?: No Does the patient have difficulty concentrating, remembering, or making decisions?: No Patient able to express need for assistance with ADLs?: Yes Does the patient have difficulty dressing or bathing?: Yes Independently performs ADLs?: No Does the patient have difficulty walking or climbing stairs?: Yes Weakness of Legs: Right Weakness of Arms/Hands: None  Permission Sought/Granted         Permission granted to share info w AGENCY: SNFs  Permission granted to share info w Relationship: daughter  Permission granted to share info w Contact Information: [phone]  Emotional Assessment       Orientation: : Oriented to Self, Oriented to Place Alcohol / Substance Use: Not Applicable Psych Involvement: No (comment)  Admission diagnosis:  Hyperglycemia [R73.9] Ischemic foot [I99.8] Leukocytosis, unspecified type [D72.829] Critical limb ischemia of right  lower extremity ([place]) [I70.221] Patient Active Problem List   Diagnosis Date Noted   Critical limb ischemia of right lower extremity ([place]) 04/10/2021   Dementia ([place]) 04/10/2021   DM2 (diabetes mellitus, type 2) ([place]) 04/10/2021   HTN (hypertension) 04/10/2021   Leukocytosis 04/10/2021   PCP:  Clinic, [place], [place]. [place] [address] Phone: [phone] Fax: [phone]     Social Determinants of Health (SDOH) Interventions    Readmission Risk Interventions No flowsheet data found.

## 2021-04-17 NOTE — Progress Notes (Signed)
Mobility Specialist: Progress Note   04/17/21 1618  Mobility  Activity Transferred:  Chair to bed  Level of Assistance +2 (takes two people)  Assistive Device Sliding board  Mobility Out of bed to chair with meals  Mobility Response Tolerated fair  Mobility performed by Mobility specialist;Nurse  $Mobility charge 1 Mobility   Attempted to transfer pt to [facility] using [place], attempt unsuccessful d/t pt's elevated pain level. Pt then assisted to the bed utilizing sliding board requiring +2 modA-maxA physical assistance. Pt back in bed with call bell in reach.  [facility] Etana Beets Mobility Specialist Mobility Specialist Phone: [phone]

## 2021-04-17 NOTE — Progress Notes (Signed)
Physical Therapy Treatment Patient Details Name: Scott Garrett MRN: [id number] DOB: 1946/04/06 Today's Date: 04/17/2021   History of Present Illness Scott Garrett is a 75 y.o. male who presented 10/17 due to increase confusion and right foot pain.  Work-up revealed right lower extremity critical limb ischemia. 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 Pt with medical history significant of dementia, DM2, HTN.    PT Comments    The pt was able to make great progress with therapy this session as he was able to progress to OOB mobility and transfer to recliner. He continues to require increased time and effort to complete bed mobility, demos limited AROM of RLE, and requires sequential cues to complete movements in bed. He also requires increased time and redirection to the task for all mobility, as well as encouragement to complete tasks in a timely manner. The pt was unable to achieve a full stand despite maxA of 2 at this time, and instead relied on maxA of 1 to complete lateral scoot transfer to the recliner. Will continue to benefit from skilled PT acutely to progress functional strength in residual limb to allow for improved independence with transfers.     Recommendations for follow up therapy are one component of a multi-disciplinary discharge planning process, led by the attending physician.  Recommendations may be updated based on patient status, additional functional criteria and insurance authorization.  Follow Up Recommendations  Skilled nursing-short term rehab (<3 hours/day)     Assistance Recommended at Discharge Frequent or constant Supervision/Assistance  Equipment Recommendations   (defer to SNF, w/c, RW and BSC drop arm if going home)    Recommendations for Other Services       Precautions / Restrictions Precautions Precautions: Fall Precaution Comments: new AKA Restrictions Weight Bearing Restrictions: Yes RLE  Weight Bearing: Non weight bearing     Mobility  Bed Mobility Overal bed mobility: Needs Assistance Bed Mobility: Rolling;Sidelying to Sit Rolling: Min assist Sidelying to sit: Mod assist       General bed mobility comments: minA to initiate LE movement, then modA to complete pivot to EOB and elevate trunk    Transfers Overall transfer level: Needs assistance Equipment used: Rolling walker (2 wheels) Transfers: Sit to/from Stand;Lateral/Scoot Transfers Sit to Stand: Max assist;+2 physical assistance          Lateral/Scoot Transfers: Max assist General transfer comment: pt attempting but unable to achieve full stand from sitting EOB. stating that his RLE was "not working", pt cued to increase effort through LLE and arms but was unable to achieve full stand despite maxA of 2. maxA of 1 for lateral scoot to chair    Ambulation/Gait             General Gait Details: pt unable          Balance Overall balance assessment: Needs assistance Sitting-balance support: Single extremity supported Sitting balance-Leahy Scale: Good Sitting balance - Comments: no assist needed, good stability   Standing balance support: Bilateral upper extremity supported Standing balance-Leahy Scale: Zero Standing balance comment: dependent on therapists, unable to achieve stand                            Cognition Arousal/Alertness: Awake/alert Behavior During Therapy: WFL for tasks assessed/performed Overall Cognitive Status: History of cognitive impairments - at baseline  General Comments: pt required increased time and frequent redirection to task. pt perseverating on watching TV and worried about his behavior towards therapist. However, when pt cued to complete task, he was unable to generate effective movements without assist regardless of cues        Exercises      General Comments General comments (skin integrity,  edema, etc.): VSS on RA      Pertinent Vitals/Pain Pain Assessment: 0-10 Faces Pain Scale: Hurts a little bit Pain Location: right residual limb Pain Descriptors / Indicators: Sharp;Discomfort;Grimacing Pain Intervention(s): Limited activity within patient's tolerance;Monitored during session;Repositioned     PT Goals (current goals can now be found in the care plan section) Acute Rehab PT Goals Patient Stated Goal: watch cartoons PT Goal Formulation: With patient Time For Goal Achievement: 04/30/21 Potential to Achieve Goals: Fair Progress towards PT goals: Progressing toward goals    Frequency    Min 3X/week      PT Plan Current plan remains appropriate       AM-PAC PT "6 Clicks" Mobility   Outcome Measure  Help needed turning from your back to your side while in a flat bed without using bedrails?: A Little Help needed moving from lying on your back to sitting on the side of a flat bed without using bedrails?: A Lot Help needed moving to and from a bed to a chair (including a wheelchair)?: Total Help needed standing up from a chair using your arms (e.g., wheelchair or bedside chair)?: Total Help needed to walk in hospital room?: Total Help needed climbing 3-5 steps with a railing? : Total 6 Click Score: 9    End of Session Equipment Utilized During Treatment: Gait belt Activity Tolerance: Patient tolerated treatment well Patient left: with call bell/phone within reach;in chair Nurse Communication: Mobility status;Need for lift equipment PT Visit Diagnosis: Pain;Muscle weakness (generalized) (M62.81);Difficulty in walking, not elsewhere classified (R26.2) Pain - Right/Left: Right Pain - part of body: Leg     Time: 5456-2563 PT Time Calculation (min) (ACUTE ONLY): 29 min  Charges:  $Therapeutic Activity: 23-37 mins                     West Carbo, PT, DPT   Acute Rehabilitation Department Pager #: [phone]   Sandra Cockayne 04/17/2021, 4:29 PM

## 2021-04-18 DIAGNOSIS — I70221 Atherosclerosis of native arteries of extremities with rest pain, right leg: Secondary | ICD-10-CM | POA: Diagnosis not present

## 2021-04-18 LAB — CBC
HCT: 24.8 % — ABNORMAL LOW (ref 39.0–52.0)
Hemoglobin: 8 g/dL — ABNORMAL LOW (ref 13.0–17.0)
MCH: 30 pg (ref 26.0–34.0)
MCHC: 32.3 g/dL (ref 30.0–36.0)
MCV: 92.9 fL (ref 80.0–100.0)
Platelets: 170 10*3/uL (ref 150–400)
RBC: 2.67 MIL/uL — ABNORMAL LOW (ref 4.22–5.81)
RDW: 14.2 % (ref 11.5–15.5)
WBC: 140.1 10*3/uL (ref 4.0–10.5)
nRBC: 0 % (ref 0.0–0.2)

## 2021-04-18 LAB — GLUCOSE, CAPILLARY
Glucose-Capillary: 113 mg/dL — ABNORMAL HIGH (ref 70–99)
Glucose-Capillary: 200 mg/dL — ABNORMAL HIGH (ref 70–99)
Glucose-Capillary: 212 mg/dL — ABNORMAL HIGH (ref 70–99)
Glucose-Capillary: 129 mg/dL — ABNORMAL HIGH (ref 70–99)

## 2021-04-18 NOTE — Progress Notes (Signed)
PROGRESS NOTE  Scott Garrett Current KYH:[id number] DOB: 1946-04-07 DOA: 04/10/2021 PCP: [name]  HPI/Recap of past 24 hours: Scott Garrett is a 75 y.o. male with medical history significant of dementia, DM2, HTN, presents from home (lives alone and has a caregiver who comes in 3 times a week) due to increase confusion and right foot pain.  Work-up revealed right lower extremity critical limb ischemia.  Also incidental findings of leukocytosis with WBC greater than 140 K.  Seen by vascular surgery, was taken to the OR emergently on 04/10/2021 by vascular surgery for Right lower extremity revascularization with fasciotomies.  Also seen by medical oncology for WBC>140K.  Flow cytometry done on 04/11/2021 showed atypical T-cell population which compromises 90% of all lymphocytes.  Consistent with a T-cell lymphoproliferative process.  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  UA positive for pyuria on presentation, started on Rocephin on 04/11/2021.  Patient lost Doppler signals to right foot and right calf muscles despite being on heparin drip.  He was taken back to the OR on 04/14/2021 by vascular surgery [name].  Now status post right AKA.  Hospital course complicated by acute blood loss anemia for which she was transfused total 2 units PRBCs for hemoglobin of 7.3 K on 04/12/2021 and 04/13/2021.  Mild serosanguineous drainage on the bandage at the site of amputation.  Vascular surgery recommended to continue to monitor this site given presence of PTFE graft.  04/18/2021: Seen and examined at his bedside.  Has no new complaints.  Denies any pain when he is not moved.   Assessment/Plan: Principal Problem:   Critical limb ischemia of right lower extremity (HCC) Active Problems:   Dementia (HCC)   DM2 (diabetes mellitus, type 2) (HCC)   HTN (hypertension)   Leukocytosis  Right lower extremity critical limb ischemia status post  revascularization and fasciotomies on 04/10/2021 by vascular surgery [name], lost Doppler signals despite being on heparin drip, taken back to the OR, status post right AKA on 04/14/2021 by vascular surgery [name]. Mild serosanguineous drainage on the bandage at the site of amputation.  Vascular surgery recommended to continue to monitor this site given presence of PTFE graft. Completed 7 days of Rocephin. Medical oncology recommended to transfer hemoglobin less than 7.5. Hemoglobin 7.7> 8.0, continue to closely monitor H&H and transfuse as indicated.  Acute blood loss anemia postsurgery He has received total of 2 units PRBCs transfusion during this admission. Heparin drip was discontinued and he was started on SQ heparin 3 times daily on 04/17/2021 Hemoglobin uptrending 8.0 K from 7.7 K. Continue to closely monitor H&H.  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  Ruled out UTI Urine analysis positive for pyuria Urine culture no growth.  Resolved nonoliguric AKI, suspect multifactorial Baseline creatinine 0.90 with GFR greater than 60 Creatinine peaked at 1.69 He is back to his baseline creatinine.  Off IV fluid. Continue to encourage oral hydration.   Continue to avoid nephrotoxic agents, dehydration and hypotension.  Resolved post repletion hypomagnesemia Magnesium 1.3>1.7 Repleted intravenously.  Acute metabolic encephalopathy, delirium in the setting of acute illness.   Continue fall precautions Continue to reorient as needed.  Resolved mild thrombocytopenia, likely secondary to acute illness.  Type 2 diabetes with hyperglycemia Hemoglobin A1c 11.5 on 04/10/2021 Continue to hold off home oral hypoglycemics Continue Semglee 10 unit daily Continue  insulin sliding scale.  Lactic acidosis Lactic acid 2.8,  repeat 2.4.  Dementia with behavioral disturbances Continue to reorient as needed.    Code Status: Full code  Family Communication: None at bedside.  Disposition Plan: Likely will discharge to SNF.   Consultants: Vascular surgery Medical oncology  Procedures: Revascularization, fasciotomies on 04/10/2021 by vascular surgery [name]. 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  Antimicrobials: Rocephin 2 g daily, 04/11/2021.  DVT prophylaxis: Subcu heparin 3 times daily.  Status is: Inpatient  Patient will require at least 2 midnights for further evaluation and treatment of present condition.        Objective: Vitals:   04/18/21 0025 04/18/21 0327 04/18/21 0755 04/18/21 1154  BP: (!) 156/74 133/61 (!) 146/67 123/61  Pulse: 76 74 80 75  Resp: 19 20 18 20   Temp: 98.7 F (37.1 C) 98.7 F (37.1 C) 98.3 F (36.8 C) 98.9 F (37.2 C)  TempSrc: Oral Oral Oral Oral  SpO2: 97% 97% 97% 100%  Weight:      Height:        Intake/Output Summary (Last 24 hours) at 04/18/2021 1500 Last data filed at 04/18/2021 0957 Gross per 24 hour  Intake 480 ml  Output 1350 ml  Net -870 ml   Filed Weights   04/12/21 0338 04/14/21 0845 04/17/21 0318  Weight: 83.9 kg 83.9 kg 86.4 kg    Exam:  General: 75 y.o. year-old male well-developed well-nourished in no acute distress.  He is alert and interactive. Cardiovascular: Regular rate and rhythm no rubs or gallops.  Respiratory: Clear to auscultation no wheezes or rales.  Abdomen: Soft nontender normal bowel sounds present.  Musculoskeletal: Right above-the-knee amputation.  Stump is wrapped with surgical dressing. Skin: No ulcerative lesions noted. Psychiatry: Mood is appropriate for condition and setting.  Data Reviewed: CBC: Recent Labs  Lab 04/14/21 0357 04/14/21 0623 04/15/21 0135 04/16/21 0124 04/17/21 0257 04/18/21 0221  WBC 147.5*  --  152.9* 149.9* 135.8* 140.1*  HGB 8.7* 9.6* 8.5* 7.6* 7.7* 8.0*  HCT  26.7* 29.2* 26.4* 23.9* 23.9* 24.8*  MCV 90.2  --  93.6 93.0 93.7 92.9  PLT 141*  --  154 151 162 970   Basic Metabolic Panel: Recent Labs  Lab 04/12/21 0512 04/13/21 0515 04/14/21 0357 04/15/21 0135 04/16/21 0124  NA 130* 136 135 132* 135  K 4.4 4.5 4.7 4.7 4.9  CL 98 105 108 104 107  CO2 23 22 24 23 23   GLUCOSE 247* 158* 125* 206* 102*  BUN 38* 27* 22 17 13   CREATININE 1.69* 1.12 0.99 1.00 0.80  CALCIUM 7.7* 7.5* 7.3* 7.4* 7.3*  MG 1.3* 1.7 1.7  --   --   PHOS 2.5  --   --   --   --    GFR: Estimated Creatinine Clearance: 86.3 mL/min (by C-G formula based on SCr of 0.8 mg/dL). Liver Function Tests: No results for input(s): AST, ALT, ALKPHOS, BILITOT, PROT, ALBUMIN in the last 168 hours. No results for input(s): LIPASE, AMYLASE in the last 168 hours. No results for input(s): AMMONIA in the last 168 hours. Coagulation Profile: No results for input(s): INR, PROTIME in the last 168 hours. Cardiac Enzymes: No results for input(s): CKTOTAL, CKMB, CKMBINDEX, TROPONINI in the last 168 hours.  BNP (last 3 results) No results for input(s): PROBNP in the last 8760 hours. HbA1C: No results for input(s): HGBA1C in the last 72 hours.  CBG: Recent Labs  Lab 04/17/21 1201 04/17/21 1624 04/17/21 2012 04/18/21 2637  04/18/21 1054  GLUCAP 295* 250* 192* 113* 200*   Lipid Profile: No results for input(s): CHOL, HDL, LDLCALC, TRIG, CHOLHDL, LDLDIRECT in the last 72 hours. Thyroid Function Tests: No results for input(s): TSH, T4TOTAL, FREET4, T3FREE, THYROIDAB in the last 72 hours. Anemia Panel: No results for input(s): VITAMINB12, FOLATE, FERRITIN, TIBC, IRON, RETICCTPCT in the last 72 hours. Urine analysis:    Component Value Date/Time   COLORURINE YELLOW 04/11/2021 0328   APPEARANCEUR CLOUDY (A) 04/11/2021 0328   LABSPEC 1.012 04/11/2021 0328   PHURINE 5.0 04/11/2021 0328   GLUCOSEU 50 (A) 04/11/2021 0328   HGBUR MODERATE (A) 04/11/2021 0328   BILIRUBINUR NEGATIVE  04/11/2021 0328   KETONESUR 5 (A) 04/11/2021 0328   PROTEINUR 30 (A) 04/11/2021 0328   NITRITE NEGATIVE 04/11/2021 0328   LEUKOCYTESUR LARGE (A) 04/11/2021 0328   Sepsis Labs: @LABRCNTIP (procalcitonin:4,lacticidven:4)  ) Recent Results (from the past 240 hour(s))  Resp Panel by RT-PCR (Flu A&B, Covid) Nasopharyngeal Swab     Status: None   Collection Time: 04/10/21  6:42 PM   Specimen: Nasopharyngeal Swab; Nasopharyngeal(NP) swabs in vial transport medium  Result Value Ref Range Status   SARS Coronavirus 2 by RT PCR NEGATIVE NEGATIVE Final    Comment: (NOTE) SARS-CoV-2 target nucleic acids are NOT DETECTED.  The SARS-CoV-2 RNA is generally detectable in upper respiratory specimens during the acute phase of infection. The lowest concentration of SARS-CoV-2 viral copies this assay can detect is 138 copies/mL. A negative result does not preclude SARS-Cov-2 infection and should not be used as the sole basis for treatment or other patient management decisions. A negative result may occur with  improper specimen collection/handling, submission of specimen other than nasopharyngeal swab, presence of viral mutation(s) within the areas targeted by this assay, and inadequate number of viral copies(<138 copies/mL). A negative result must be combined with clinical observations, patient history, and epidemiological information. The expected result is Negative.  Fact Sheet for Patients:  EntrepreneurPulse.com.au  Fact Sheet for Healthcare Providers:  IncredibleEmployment.be  This test is no t yet approved or cleared by the [place] FDA and  has been authorized for detection and/or diagnosis of SARS-CoV-2 by FDA under an Emergency Use Authorization (EUA). This EUA will remain  in effect (meaning this test can be used) for the duration of the COVID-19 declaration under Section 564(b)(1) of the Act, 21 U.S.C.section 360bbb-3(b)(1), unless the  authorization is terminated  or revoked sooner.       Influenza A by PCR NEGATIVE NEGATIVE Final   Influenza B by PCR NEGATIVE NEGATIVE Final    Comment: (NOTE) The Xpert Xpress SARS-CoV-2/FLU/RSV plus assay is intended as an aid in the diagnosis of influenza from Nasopharyngeal swab specimens and should not be used as a sole basis for treatment. Nasal washings and aspirates are unacceptable for Xpert Xpress SARS-CoV-2/FLU/RSV testing.  Fact Sheet for Patients: EntrepreneurPulse.com.au  Fact Sheet for Healthcare Providers: IncredibleEmployment.be  This test is not yet approved or cleared by the [place] FDA and has been authorized for detection and/or diagnosis of SARS-CoV-2 by FDA under an Emergency Use Authorization (EUA). This EUA will remain in effect (meaning this test can be used) for the duration of the COVID-19 declaration under Section 564(b)(1) of the Act, 21 U.S.C. section 360bbb-3(b)(1), unless the authorization is terminated or revoked.  Performed at [facility], [employer] [address], [place], [place] [address]   Culture, blood (routine x 2)     Status: None   Collection Time: 04/10/21  8:55 PM   Specimen: BLOOD LEFT HAND  Result Value Ref Range Status   Specimen Description BLOOD LEFT HAND  Final   Special Requests   Final    BOTTLES DRAWN AEROBIC AND ANAEROBIC Blood Culture results may not be optimal due to an inadequate volume of blood received in culture bottles   Culture   Final    NO GROWTH 5 DAYS Performed at [facility], [place] [address] [place], [place] [address]    Report Status 04/15/2021 FINAL  Final  Culture, blood (routine x 2)     Status: None   Collection Time: 04/10/21  9:40 PM   Specimen: BLOOD LEFT HAND  Result Value Ref Range Status   Specimen Description BLOOD LEFT HAND  Final   Special Requests   Final    BOTTLES DRAWN AEROBIC AND ANAEROBIC Blood Culture results may not be  optimal due to an inadequate volume of blood received in culture bottles   Culture   Final    NO GROWTH 5 DAYS Performed at [facility], [place] [address], [place], [place] [address]    Report Status 04/15/2021 FINAL  Final  Urine Culture     Status: None   Collection Time: 04/12/21  3:50 AM   Specimen: Urine, Clean Catch  Result Value Ref Range Status   Specimen Description URINE, CLEAN CATCH  Final   Special Requests NONE  Final   Culture   Final    NO GROWTH Performed at [facility], [place] [address] [place], [place] [address]    Report Status 04/12/2021 FINAL  Final  MRSA Next Gen by PCR, Nasal     Status: None   Collection Time: 04/13/21 12:05 PM   Specimen: Nasal Mucosa; Nasal Swab  Result Value Ref Range Status   MRSA by PCR Next Gen NOT DETECTED NOT DETECTED Final    Comment: (NOTE) The GeneXpert MRSA Assay (FDA approved for NASAL specimens only), is one component of a comprehensive MRSA colonization surveillance program. It is not intended to diagnose MRSA infection nor to guide or monitor treatment for MRSA infections. Test performance is not FDA approved in patients less than 71 years old. Performed at [facility], [place] [address] [place], [place] [address]       Studies: No results found.  Scheduled Meds:  Chlorhexidine Gluconate Cloth  6 each Topical Daily   heparin  5,000 Units Subcutaneous Q8H    HYDROmorphone (DILAUDID) injection  1 mg Intravenous Once   insulin aspart  0-15 Units Subcutaneous TID WC   insulin glargine-yfgn  10 Units Subcutaneous Daily   nicotine  14 mg Transdermal Daily    Continuous Infusions:     LOS: 8 days     Kayleen Memos, MD [facility] Pager [phone]  If 7PM-7AM, please contact night-coverage [email or web address] Password [email or web address] 04/18/2021, 3:00 PM

## 2021-04-18 NOTE — Progress Notes (Signed)
Inpatient Diabetes Program Recommendations  AACE/ADA: New Consensus Statement on Inpatient Glycemic Control (2015)  Target Ranges:  Prepandial:   less than 140 mg/dL      Peak postprandial:   less than 180 mg/dL (1-2 hours)      Critically ill patients:  140 - 180 mg/dL   Lab Results  Component Value Date   GLUCAP 200 (H) 04/18/2021   HGBA1C 11.5 (H) 04/10/2021    Review of Glycemic Control Results for Scott Garrett, Scott Garrett (MRN [id number]) as of 04/18/2021 13:02  Ref. Range 04/17/2021 16:24 04/17/2021 20:12 04/18/2021 06:13 04/18/2021 10:54  Glucose-Capillary Latest Ref Range: 70 - 99 mg/dL 250 (H) 192 (H) 113 (H) 200 (H)   Diabetes history: DM2 Outpatient Diabetes medications: Metformin 500 mg BID Current orders for Inpatient glycemic control: Semglee 10 daily, Novolog 0-15 TID with meals   Inpatient Diabetes Program Recommendations:     Consider Novolog 2 units TID (assuming patient is consuming >50% of meals).  Thanks, Bronson Curb, MSN, RNC-OB Diabetes Coordinator [phone] (8a-5p)

## 2021-04-18 NOTE — Progress Notes (Addendum)
  Progress Note    04/18/2021 7:21 AM 4 Days Post-Op  Subjective:  says he's a little cold but otherwise feels ok  Afebrile   Vitals:   04/18/21 0025 04/18/21 0327  BP: (!) 156/74 133/61  Pulse: 76 74  Resp: 19 20  Temp: 98.7 F (37.1 C) 98.7 F (37.1 C)  SpO2: 97% 97%    Physical Exam: General:  no distress Lungs:  non labored Incisions:  right groin with staples in tact and healing.  There is mild serosanginous drainage on the bandage.  Unable to expresss any fluid.  Extremities:  right AKA healing well.  There is some ecchymosis lateral incision.    CBC    Component Value Date/Time   WBC 140.1 (HH) 04/18/2021 0221   RBC 2.67 (L) 04/18/2021 0221   HGB 8.0 (L) 04/18/2021 0221   HCT 24.8 (L) 04/18/2021 0221   PLT 170 04/18/2021 0221   MCV 92.9 04/18/2021 0221   MCH 30.0 04/18/2021 0221   MCHC 32.3 04/18/2021 0221   RDW 14.2 04/18/2021 0221   LYMPHSABS 112.4 (H) 04/10/2021 2130   MONOABS 4.0 (H) 04/10/2021 2130   EOSABS 0.0 04/10/2021 2130   BASOSABS 0.0 04/10/2021 2130    BMET    Component Value Date/Time   NA 135 04/16/2021 0124   K 4.9 04/16/2021 0124   CL 107 04/16/2021 0124   CO2 23 04/16/2021 0124   GLUCOSE 102 (H) 04/16/2021 0124   BUN 13 04/16/2021 0124   CREATININE 0.80 04/16/2021 0124   CALCIUM 7.3 (L) 04/16/2021 0124   GFRNONAA >60 04/16/2021 0124    INR No results found for: INR   Intake/Output Summary (Last 24 hours) at 04/18/2021 0721 Last data filed at 04/18/2021 0618 Gross per 24 hour  Intake 480 ml  Output 750 ml  Net -270 ml     Assessment/Plan:  75 y.o. male is s/p:  S/p right CFA to peroneal bypass with PTFE and fasciotomies POD 7 with subsequent right AKA 4 Days Post-Op   -pt's amp site looks good with mild ecchymosis laterally.  Continue to monitor. -right groin with staples in tact with mild serosanguinous drainage on the bandage.  No evidence of infection.  Continue to monitor given presence of PTFE.  -DVT  prophylaxis:  sq heparin   Leontine Locket, PA-C [facility] [phone] 04/18/2021 7:21 AM  I have independently interviewed and examined patient and agree with PA assessment and plan above.  He does have drainage from the right groin this is probably expected after removing significant lymphadenopathy.  We will need to watch this due to placement of PTFE graft.  Otherwise incisions are healing well.  Chabeli Barsamian C. Donzetta Matters, MD [facility] Office: [phone] Pager: [phone]

## 2021-04-18 NOTE — TOC Progression Note (Signed)
Transition of Care [facility]) - Progression Note    Patient Details  Name: Capone Schwinn MRN: [id number] Date of Birth: Apr 12, 1946  Transition of Care [facility]) CM/SW [place], [place] Phone Number: 04/18/2021, 1:50 PM  Clinical Narrative:     Up loaded clinicals to [facility] -  Emailed Medicare.gov , [place] listings to daughter  [name], MSW, LCSW Clinical Social Worker    Expected Discharge Plan: Skilled Nursing Facility Barriers to Discharge: [place] (PASRR), Insurance Authorization, Continued Medical Work up, SNF Pending bed offer  Expected Discharge Plan and Services Expected Discharge Plan: [place] In-house Referral: Clinical Social Work     Living arrangements for the past 2 months: Apartment                                       Social Determinants of Health (SDOH) Interventions    Readmission Risk Interventions No flowsheet data found.

## 2021-04-19 DIAGNOSIS — I70221 Atherosclerosis of native arteries of extremities with rest pain, right leg: Secondary | ICD-10-CM | POA: Diagnosis not present

## 2021-04-19 LAB — GLUCOSE, CAPILLARY
Glucose-Capillary: 92 mg/dL (ref 70–99)
Glucose-Capillary: 162 mg/dL — ABNORMAL HIGH (ref 70–99)
Glucose-Capillary: 105 mg/dL — ABNORMAL HIGH (ref 70–99)
Glucose-Capillary: 173 mg/dL — ABNORMAL HIGH (ref 70–99)

## 2021-04-19 LAB — BASIC METABOLIC PANEL
Anion gap: 4 — ABNORMAL LOW (ref 5–15)
BUN: 13 mg/dL (ref 8–23)
CO2: 24 mmol/L (ref 22–32)
Calcium: 8 mg/dL — ABNORMAL LOW (ref 8.9–10.3)
Chloride: 107 mmol/L (ref 98–111)
Creatinine, Ser: 0.65 mg/dL (ref 0.61–1.24)
GFR, Estimated: >60 mL/min (ref >60)
Glucose, Bld: 89 mg/dL (ref 70–99)
Potassium: 4.1 mmol/L (ref 3.5–5.1)
Sodium: 135 mmol/L (ref 135–145)

## 2021-04-19 LAB — PHOSPHORUS: Phosphorus: 2.1 mg/dL — ABNORMAL LOW (ref 2.5–4.6)

## 2021-04-19 LAB — MAGNESIUM: Magnesium: 1.4 mg/dL — ABNORMAL LOW (ref 1.7–2.4)

## 2021-04-19 LAB — CBC
HCT: 23 % — ABNORMAL LOW (ref 39.0–52.0)
Hemoglobin: 7.5 g/dL — ABNORMAL LOW (ref 13.0–17.0)
MCH: 29.9 pg (ref 26.0–34.0)
MCHC: 32.6 g/dL (ref 30.0–36.0)
MCV: 91.6 fL (ref 80.0–100.0)
Platelets: 178 10*3/uL (ref 150–400)
RBC: 2.51 MIL/uL — ABNORMAL LOW (ref 4.22–5.81)
RDW: 14.4 % (ref 11.5–15.5)
WBC: 130 10*3/uL (ref 4.0–10.5)
nRBC: 0.1 % (ref 0.0–0.2)

## 2021-04-19 MED ORDER — MAGNESIUM SULFATE 2 GM/50ML IV SOLN
2.0000 g | Freq: Once | INTRAVENOUS | Status: AC
  Administered 2021-04-19: 2 g via INTRAVENOUS
  Filled 2021-04-19: qty 50

## 2021-04-19 NOTE — Progress Notes (Signed)
Physical Therapy Treatment Patient Details Name: Scott Garrett MRN: [id number] DOB: [birth year] Today's Date: 04/19/2021   History of Present Illness Pt is a 75 y.o. male admitted 04/10/21 with confusion, right foot pain.  Work-up for RLE critical limb ischemia. 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 PMH includes dementia, DM2, HTN.   PT Comments    Today's session focused on attempts at transfer training for OOB mobility. Pt self-limiting and difficult to reason with regarding participation and importance of mobility. Pt requiring maxA+2 to initiate bed mobility, ultimately unwilling to progress mobility beyond EOB this session despite max encouragement and education. Pt reports, "I want to do what I want to do when I want to do it." If pt able to demonstrate improved desire to participate, continue to recommend SNF-level therapies to maximize functional mobility and independence. If pt to return home, will require significant assist with mobility and self-care.   Recommendations for follow up therapy are one component of a multi-disciplinary discharge planning process, led by the attending physician.  Recommendations may be updated based on patient status, additional functional criteria and insurance authorization.  Follow Up Recommendations  Skilled nursing-short term rehab (<3 hours/day)     Assistance Recommended at Discharge Frequent or constant Supervision/Assistance  Equipment Recommendations  Wheelchair (measurements PT);Wheelchair cushion (measurements PT);BSC    Recommendations for Other Services       Precautions / Restrictions Precautions Precautions: Fall Precaution Comments: new R AKA Restrictions Weight Bearing Restrictions: Yes RLE Weight Bearing: Non weight bearing     Mobility  Bed Mobility Overal bed mobility: Needs Assistance Bed Mobility: Supine to Sit;Sit to Supine     Supine to sit: Max assist;+2 for physical  assistance;+2 for safety/equipment;HOB elevated Sit to supine: Max assist;+2 for safety/equipment   General bed mobility comments: Max A x 2 to sit EOB primarily due to resistance and poor initiation of tasks. Max A x 2 to return to supine due to pt reporting desire to lay back down, would not initiate and wanted to sit EOB unattended despite education on safety.    Transfers                   General transfer comment: declined despite education/encouragement    Ambulation/Gait                 Stairs             Wheelchair Mobility    Modified Rankin (Stroke Patients Only)       Balance Overall balance assessment: Needs assistance Sitting-balance support: Feet supported Sitting balance-Leahy Scale: Good Sitting balance - Comments: able to sit EOB > 10 min without LOB, did not formally challenge dynamic tasks                                    Cognition Arousal/Alertness: Awake/alert Behavior During Therapy: WFL for tasks assessed/performed;Agitated Overall Cognitive Status: History of cognitive impairments - at baseline                                 General Comments: Per chart, h/o dementia. Pt fluctuating between pleasant versus agitated; difficult to reason with regarding need for participation and OOB mobility; pt reports, "I just want to relax... I will do things when I want to do them." Suspect little to no awareness of  current situation and condition        Exercises      General Comments General comments (skin integrity, edema, etc.): R residual limb noted with edema; pt reports not ready to look at residual limb yet. Pt appeared to "talk in circles" and disagree with everything therapists said despite if it was something pt earlier reported he wanted (i.e to lay back down)      Pertinent Vitals/Pain Pain Assessment: Faces Faces Pain Scale: Hurts a little bit Pain Location: back when HOB elevated Pain  Descriptors / Indicators: Grimacing Pain Intervention(s): Monitored during session    Home Living                          Prior Function            PT Goals (current goals can now be found in the care plan section) Acute Rehab PT Goals Patient Stated Goal: "I want to do what I want to do when I want to do it" Progress towards PT goals: Not progressing toward goals - comment (limited by cognition/desire to participate)    Frequency    Min 2X/week      PT Plan Frequency needs to be updated    Co-evaluation PT/OT/SLP Co-Evaluation/Treatment: Yes Reason for Co-Treatment: Necessary to address cognition/behavior during functional activity;For patient/therapist safety;To address functional/ADL transfers   OT goals addressed during session: Other (comment) (functional transfers)      AM-PAC PT "6 Clicks" Mobility   Outcome Measure  Help needed turning from your back to your side while in a flat bed without using bedrails?: A Little Help needed moving from lying on your back to sitting on the side of a flat bed without using bedrails?: A Lot Help needed moving to and from a bed to a chair (including a wheelchair)?: Total Help needed standing up from a chair using your arms (e.g., wheelchair or bedside chair)?: Total Help needed to walk in hospital room?: Total Help needed climbing 3-5 steps with a railing? : Total 6 Click Score: 9    End of Session   Activity Tolerance: Patient limited by fatigue;Other (comment) (limited by cognitive status) Patient left: in bed;with call bell/phone within reach;with bed alarm set;with nursing/sitter in room Nurse Communication: Mobility status PT Visit Diagnosis: Pain;Muscle weakness (generalized) (M62.81);Difficulty in walking, not elsewhere classified (R26.2) Pain - Right/Left: Right Pain - part of body: Leg     Time: 6283-6629 PT Time Calculation (min) (ACUTE ONLY): 24 min  Charges:  $Therapeutic Activity: 8-22  mins                     Mabeline Caras, PT, DPT Acute Rehabilitation Services  Pager [phone] Office [place] 04/19/2021, 10:06 AM

## 2021-04-19 NOTE — Progress Notes (Signed)
Occupational Therapy Treatment Patient Details Name: Scott Garrett MRN: [id number] DOB: [birth year] Today's Date: 04/19/2021   History of present illness Scott Garrett is a 75 y.o. male who presented 10/17 due to increase confusion and right foot pain.  Work-up revealed right lower extremity critical limb ischemia. 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 Pt with medical history significant of dementia, DM2, HTN.   OT comments  Session focused on attempts to progress OOB transfers to maximize independence with ADLs. However, pt self limiting with intermittent agitation during session. Pt required overall Max A x 2 for bed mobility due to poor initiation and contradictory statements regarding needs (wanted to lay down but when offered assist to do so reported he would rather stay sitting EOB without assist). Despite education and encouragement, pt adamantly declines OOB attempts and reports only wanting to do things when he wants to do them. If pt demo improved participation, would benefit from SNF rehab as he is unable to safely care for self without extensive assist at this time.   Recommendations for follow up therapy are one component of a multi-disciplinary discharge planning process, led by the attending physician.  Recommendations may be updated based on patient status, additional functional criteria and insurance authorization.    Follow Up Recommendations  Skilled nursing-short term rehab (<3 hours/day)    Assistance Recommended at Discharge Frequent or constant Supervision/Assistance  Equipment Recommendations  Wheelchair (measurements OT);Wheelchair cushion (measurements OT)    Recommendations for Other Services      Precautions / Restrictions Precautions Precautions: Fall Precaution Comments: new R AKA Restrictions Weight Bearing Restrictions: Yes RLE Weight Bearing: Non weight bearing       Mobility Bed  Mobility Overal bed mobility: Needs Assistance Bed Mobility: Supine to Sit;Sit to Supine     Supine to sit: Max assist;+2 for physical assistance;+2 for safety/equipment;HOB elevated Sit to supine: Max assist;+2 for safety/equipment   General bed mobility comments: Max A x 2 to sit EOB primarily due to resistance and poor initiation of tasks. Max A x 2 to return to supine due to pt reporting desire to lay back down, would not initiate and wanted to sit EOB unattended despite education on safety.    Transfers                   General transfer comment: declined despite education/encouragement     Balance Overall balance assessment: Needs assistance Sitting-balance support: Feet supported Sitting balance-Leahy Scale: Good Sitting balance - Comments: able to sit EOB > 10 min without LOB, did not formally challenge dynamic tasks                                   ADL either performed or assessed with clinical judgement   ADL Overall ADL's : Needs assistance/impaired                                       General ADL Comments: Session focused on progression of OOB activities with plan to progress Stedy vs sliding board transfers in translation to ADLs. However, due to cognition and variable agitation, unable to gain participation in meaningful activities.     Vision   Vision Assessment?: No apparent visual deficits   Perception     Praxis  Cognition Arousal/Alertness: Awake/alert Behavior During Therapy: WFL for tasks assessed/performed;Agitated Overall Cognitive Status: History of cognitive impairments - at baseline                                 General Comments: Pt with variable pleasant vs agitated attitude, difficult to reason with and reports understanding of therapists' education but reports wanting to relax and do things on his own time. Poor safety awareness and requires multimodal cues to participate.           Exercises     Shoulder Instructions       General Comments R residual limb noted with edema. Pt appeared to "talk in circles" and disagree with everything therapists said despite if it was something pt earlier reported he wanted (i.e to lay back down)    Pertinent Vitals/ Pain       Pain Assessment: Faces Faces Pain Scale: Hurts a little bit Pain Location: back with HOB elevated Pain Descriptors / Indicators: Grimacing Pain Intervention(s): Monitored during session;Limited activity within patient's tolerance  Home Living                                          Prior Functioning/Environment              Frequency  Min 2X/week        Progress Toward Goals  OT Goals(current goals can now be found in the care plan section)  Progress towards OT goals: Not progressing toward goals - comment (self limiting)  Acute Rehab OT Goals OT Goal Formulation: With patient Time For Goal Achievement: 04/30/21 Potential to Achieve Goals: Fair ADL Goals Pt Will Perform Lower Body Bathing: with mod assist;sit to/from stand Pt Will Perform Lower Body Dressing: with mod assist;sit to/from stand Pt Will Transfer to Toilet: with mod assist;stand pivot transfer;bedside commode Pt Will Perform Toileting - Clothing Manipulation and hygiene: with mod assist;sitting/lateral leans  Plan Discharge plan remains appropriate    Co-evaluation    PT/OT/SLP Co-Evaluation/Treatment: Yes Reason for Co-Treatment: For patient/therapist safety;To address functional/ADL transfers   OT goals addressed during session: Other (comment) (functional transfers)      AM-PAC OT "6 Clicks" Daily Activity     Outcome Measure   Help from another person eating meals?: None Help from another person taking care of personal grooming?: A Little Help from another person toileting, which includes using toliet, bedpan, or urinal?: Total Help from another person bathing (including washing,  rinsing, drying)?: A Lot Help from another person to put on and taking off regular upper body clothing?: A Little Help from another person to put on and taking off regular lower body clothing?: A Lot 6 Click Score: 15    End of Session    OT Visit Diagnosis: Other abnormalities of gait and mobility (R26.89);Muscle weakness (generalized) (M62.81);Pain   Activity Tolerance Treatment limited secondary to agitation;Other (comment) (limited by cognition)   Patient Left in bed;with call bell/phone within reach;with bed alarm set   Nurse Communication Mobility status;Other (comment) (left off mitts for pt to drink coffee)        Time: 0093-8182 OT Time Calculation (min): 23 min  Charges: OT General Charges $OT Visit: 1 Visit OT Treatments $Therapeutic Activity: 8-22 mins  Malachy Chamber, OTR/L Acute Rehab Services Office: [phone]   Layla Maw  04/19/2021, 9:30 AM

## 2021-04-19 NOTE — Progress Notes (Signed)
PROGRESS NOTE    Scott Garrett  BBC:[id number] DOB: 09-25-1945 DOA: 04/10/2021 PCP: [facility], [name]     Brief Narrative:  This 75 y.o. male with medical history significant of dementia, DM2, HTN, presents from home (lives alone and has a caregiver who comes in 3 times a week) due to increase confusion and right foot pain.  Work-up revealed right lower extremity critical limb ischemia.  Also incidental findings of leukocytosis with WBC greater than 140 K.  Seen by vascular surgery, was taken to the OR emergently on 04/10/2021 by vascular surgery for Right lower extremity revascularization with fasciotomies.  Also seen by medical oncology for WBC>140K.  Flow cytometry done on 04/11/2021 showed atypical T-cell population which compromises 90% of all lymphocytes.  Consistent with a T-cell lymphoproliferative process.  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  UA positive for pyuria on presentation, started on Rocephin on 04/11/2021.  Patient lost Doppler signals to right foot and right calf muscles despite being on heparin drip.  He was taken back to the OR on 04/14/2021 by vascular surgery [name].  Now status post right AKA.  Hospital course complicated by acute blood loss anemia for which she was transfused total 2 units PRBCs for hemoglobin of 7.3 K on 04/12/2021 and 04/13/2021.  Mild serosanguineous drainage on the bandage at the site of amputation.  Vascular surgery recommended to continue to monitor this site given presence of PTFE graft.  Assessment & Plan:   Principal Problem:   Critical limb ischemia of right lower extremity (HCC) Active Problems:   Dementia (HCC)   DM2 (diabetes mellitus, type 2) (HCC)   HTN (hypertension)   Leukocytosis   Right LE critical limb ischemia /s/p revascularization. Lost doppler signals  despite being on heparin. S/p right AKA: Mild serosanguineous drainage on the bandage at the site of  amputation.   Vascular surgery recommended to continue to monitor this site given presence of PTFE graft. She completed 7-day course of Rocephin Medical oncology recommended to transfuse If hemoglobin less than 7.5. Hemoglobin 7.7> 8.0, continue to closely monitor H&H and transfuse as indicated.   Acute blood loss anemia: He has received total of 2 units PRBCs transfusion during this admission. Heparin drip was discontinued and he was started on SQ heparin 3 times daily on 04/17/2021 Hemoglobin uptrending 8.0 K from 7.7 K. Continue to closely monitor H&H.   Newly diagnosed B-cell lymphoproliferative disorder: Confirmed on lymph node biopsy and flow cytometry done on 04/11/2021 Management as per oncology  Acute kidney injury: > Resolved. Resolved, suspect multifactorial. Encourage oral hydration.   Hypomagnesemia : Replaced and improved.   Acute metabolic encephalopathy, delirium in the setting of acute illness. Continue fall precautions Continue to reorient as needed.   Type 2 diabetes with hyperglycemia: Continue Semglee 10 units daily, Regular insulin sliding scale. Hemoglobin A1c 11.4.    Lactic acidosis : Lactic acid 2.8 >2.4   Dementia with behavioral disturbances Continue to reorient as needed.   DVT prophylaxis: Heparin sq Code Status: Full code. Family Communication: No family at bed side. Disposition Plan:   Status is: Inpatient  Remains inpatient appropriate because: Critical limb ischemia underwent right AKA  Decubitus discharged to skilled nursing facility.   Consultants:  Vascular surgery  Procedures:  Revascularization, fasciotomies on 04/10/2021 by vascular surgery [name]. 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    Antimicrobials:   Anti-infectives (From admission, onward)    Start     Dose/Rate Route Frequency  Ordered Stop   04/11/21 0845  cefTRIAXone (ROCEPHIN) 1 g in sodium chloride 0.9 % 100 mL IVPB  Status:  Discontinued         1 g 200 mL/hr over 30 Minutes Intravenous Every 24 hours 04/11/21 0746 04/11/21 0747   04/11/21 0845  cefTRIAXone (ROCEPHIN) 2 g in sodium chloride 0.9 % 100 mL IVPB  Status:  Discontinued        2 g 200 mL/hr over 30 Minutes Intravenous Every 24 hours 04/11/21 0747 04/16/21 1413   04/10/21 2030  piperacillin-tazobactam (ZOSYN) IVPB 3.375 g        3.375 g 100 mL/hr over 30 Minutes Intravenous  Once 04/10/21 2015 04/10/21 2220        Subjective: Patient was seen and examined at bedside.  Overnight events noted.   Patient reports feels better, he denies any pain.  He is s/p right AKA.  Objective: Vitals:   04/18/21 2312 04/19/21 0316 04/19/21 0806 04/19/21 1131  BP: (!) 141/71 131/66 120/61 118/63  Pulse: 67 67 74 73  Resp: 16 16 16 19   Temp: 99 F (37.2 C) 99 F (37.2 C) 99.1 F (37.3 C) 98.9 F (37.2 C)  TempSrc: Oral Oral Oral Oral  SpO2: 99% 99% 97% 97%  Weight:  85.2 kg    Height:        Intake/Output Summary (Last 24 hours) at 04/19/2021 1507 Last data filed at 04/19/2021 1300 Gross per 24 hour  Intake 250 ml  Output 1500 ml  Net -1250 ml   Filed Weights   04/14/21 0845 04/17/21 0318 04/19/21 0316  Weight: 83.9 kg 86.4 kg 85.2 kg    Examination:  General exam: Appears comfortable, not in any acute distress, deconditioned. Respiratory system: Clear to auscultation. Respiratory effort normal. Cardiovascular system: S1-S2 heard, regular rate and rhythm, no murmur. Gastrointestinal system: Abdomen is soft, nontender, nondistended, BS+ Central nervous system: Alert and oriented. No focal neurological deficits. Extremities: Right AKA, staples noted at this time, no signs of erythema. Skin: No rashes, lesions or ulcers Psychiatry: Judgement and insight appear normal. Mood & affect appropriate.     Data Reviewed: I have personally reviewed following labs and imaging studies  CBC: Recent Labs  Lab 04/15/21 0135 04/16/21 0124 04/17/21 0257 04/18/21 0221  04/19/21 0517  WBC 152.9* 149.9* 135.8* 140.1* 130.0*  HGB 8.5* 7.6* 7.7* 8.0* 7.5*  HCT 26.4* 23.9* 23.9* 24.8* 23.0*  MCV 93.6 93.0 93.7 92.9 91.6  PLT 154 151 162 170 470   Basic Metabolic Panel: Recent Labs  Lab 04/13/21 0515 04/14/21 0357 04/15/21 0135 04/16/21 0124 04/19/21 0517  NA 136 135 132* 135 135  K 4.5 4.7 4.7 4.9 4.1  CL 105 108 104 107 107  CO2 22 24 23 23 24   GLUCOSE 158* 125* 206* 102* 89  BUN 27* 22 17 13 13   CREATININE 1.12 0.99 1.00 0.80 0.65  CALCIUM 7.5* 7.3* 7.4* 7.3* 8.0*  MG 1.7 1.7  --   --  1.4*  PHOS  --   --   --   --  2.1*   GFR: Estimated Creatinine Clearance: 86.3 mL/min (by C-G formula based on SCr of 0.65 mg/dL). Liver Function Tests: No results for input(s): AST, ALT, ALKPHOS, BILITOT, PROT, ALBUMIN in the last 168 hours. No results for input(s): LIPASE, AMYLASE in the last 168 hours. No results for input(s): AMMONIA in the last 168 hours. Coagulation Profile: No results for input(s): INR, PROTIME in the last 168 hours. Cardiac  Enzymes: No results for input(s): CKTOTAL, CKMB, CKMBINDEX, TROPONINI in the last 168 hours. BNP (last 3 results) No results for input(s): PROBNP in the last 8760 hours. HbA1C: No results for input(s): HGBA1C in the last 72 hours. CBG: Recent Labs  Lab 04/18/21 1054 04/18/21 1543 04/18/21 2044 04/19/21 0612 04/19/21 1129  GLUCAP 200* 212* 129* 92 162*   Lipid Profile: No results for input(s): CHOL, HDL, LDLCALC, TRIG, CHOLHDL, LDLDIRECT in the last 72 hours. Thyroid Function Tests: No results for input(s): TSH, T4TOTAL, FREET4, T3FREE, THYROIDAB in the last 72 hours. Anemia Panel: No results for input(s): VITAMINB12, FOLATE, FERRITIN, TIBC, IRON, RETICCTPCT in the last 72 hours. Sepsis Labs: No results for input(s): PROCALCITON, LATICACIDVEN in the last 168 hours.  Recent Results (from the past 240 hour(s))  Resp Panel by RT-PCR (Flu A&B, Covid) Nasopharyngeal Swab     Status: None   Collection  Time: 04/10/21  6:42 PM   Specimen: Nasopharyngeal Swab; Nasopharyngeal(NP) swabs in vial transport medium  Result Value Ref Range Status   SARS Coronavirus 2 by RT PCR NEGATIVE NEGATIVE Final    Comment: (NOTE) SARS-CoV-2 target nucleic acids are NOT DETECTED.  The SARS-CoV-2 RNA is generally detectable in upper respiratory specimens during the acute phase of infection. The lowest concentration of SARS-CoV-2 viral copies this assay can detect is 138 copies/mL. A negative result does not preclude SARS-Cov-2 infection and should not be used as the sole basis for treatment or other patient management decisions. A negative result may occur with  improper specimen collection/handling, submission of specimen other than nasopharyngeal swab, presence of viral mutation(s) within the areas targeted by this assay, and inadequate number of viral copies(<138 copies/mL). A negative result must be combined with clinical observations, patient history, and epidemiological information. The expected result is Negative.  Fact Sheet for Patients:  EntrepreneurPulse.com.au  Fact Sheet for Healthcare Providers:  IncredibleEmployment.be  This test is no t yet approved or cleared by the [place] FDA and  has been authorized for detection and/or diagnosis of SARS-CoV-2 by FDA under an Emergency Use Authorization (EUA). This EUA will remain  in effect (meaning this test can be used) for the duration of the COVID-19 declaration under Section 564(b)(1) of the Act, 21 U.S.C.section 360bbb-3(b)(1), unless the authorization is terminated  or revoked sooner.       Influenza A by PCR NEGATIVE NEGATIVE Final   Influenza B by PCR NEGATIVE NEGATIVE Final    Comment: (NOTE) The Xpert Xpress SARS-CoV-2/FLU/RSV plus assay is intended as an aid in the diagnosis of influenza from Nasopharyngeal swab specimens and should not be used as a sole basis for treatment. Nasal washings  and aspirates are unacceptable for Xpert Xpress SARS-CoV-2/FLU/RSV testing.  Fact Sheet for Patients: EntrepreneurPulse.com.au  Fact Sheet for Healthcare Providers: IncredibleEmployment.be  This test is not yet approved or cleared by the [place] FDA and has been authorized for detection and/or diagnosis of SARS-CoV-2 by FDA under an Emergency Use Authorization (EUA). This EUA will remain in effect (meaning this test can be used) for the duration of the COVID-19 declaration under Section 564(b)(1) of the Act, 21 U.S.C. section 360bbb-3(b)(1), unless the authorization is terminated or revoked.  Performed at [facility], [place] [address], [place], [place] [address]   Culture, blood (routine x 2)     Status: None   Collection Time: 04/10/21  8:55 PM   Specimen: BLOOD LEFT HAND  Result Value Ref Range Status   Specimen Description BLOOD LEFT  HAND  Final   Special Requests   Final    BOTTLES DRAWN AEROBIC AND ANAEROBIC Blood Culture results may not be optimal due to an inadequate volume of blood received in culture bottles   Culture   Final    NO GROWTH 5 DAYS Performed at [facility], [place] [address], [place], [place] [address]    Report Status 04/15/2021 FINAL  Final  Culture, blood (routine x 2)     Status: None   Collection Time: 04/10/21  9:40 PM   Specimen: BLOOD LEFT HAND  Result Value Ref Range Status   Specimen Description BLOOD LEFT HAND  Final   Special Requests   Final    BOTTLES DRAWN AEROBIC AND ANAEROBIC Blood Culture results may not be optimal due to an inadequate volume of blood received in culture bottles   Culture   Final    NO GROWTH 5 DAYS Performed at [facility], [place] [address] [place], [place] [address]    Report Status 04/15/2021 FINAL  Final  Urine Culture     Status: None   Collection Time: 04/12/21  3:50 AM   Specimen: Urine, Clean Catch  Result Value Ref Range Status    Specimen Description URINE, CLEAN CATCH  Final   Special Requests NONE  Final   Culture   Final    NO GROWTH Performed at [facility], [place] [address], [place], [place] [address]    Report Status 04/12/2021 FINAL  Final  MRSA Next Gen by PCR, Nasal     Status: None   Collection Time: 04/13/21 12:05 PM   Specimen: Nasal Mucosa; Nasal Swab  Result Value Ref Range Status   MRSA by PCR Next Gen NOT DETECTED NOT DETECTED Final    Comment: (NOTE) The GeneXpert MRSA Assay (FDA approved for NASAL specimens only), is one component of a comprehensive MRSA colonization surveillance program. It is not intended to diagnose MRSA infection nor to guide or monitor treatment for MRSA infections. Test performance is not FDA approved in patients less than 46 years old. Performed at [facility], [place] [address] [place],  [address]     Radiology Studies: No results found.  Scheduled Meds:  Chlorhexidine Gluconate Cloth  6 each Topical Daily   heparin  5,000 Units Subcutaneous Q8H    HYDROmorphone (DILAUDID) injection  1 mg Intravenous Once   insulin aspart  0-15 Units Subcutaneous TID WC   insulin glargine-yfgn  10 Units Subcutaneous Daily   nicotine  14 mg Transdermal Daily   Continuous Infusions:   LOS: 9 days    Time spent:  35 mins    Scott Lebeda, MD [facility]   If 7PM-7AM, please contact night-coverage

## 2021-04-19 NOTE — Plan of Care (Signed)
  Problem: Clinical Measurements: Goal: Respiratory complications will improve Outcome: Progressing: room air SPO2 98%, no respiratory distress.   Problem: Clinical Measurements: Goal: Cardiovascular complication will be avoided Outcome: Progressing: Hemodynamic stable. NSR on monitor.   Problem: Safety: Goal: Non-violent Restraint(s) Outcome: Progressing: soft mitten applied due to Pt pulled IV and monitor out repeatedly.    Problem: Skin Integrity: Right groin has lots of serosanguinous drainage. Cleaned with saline. Dressing changed with ABD pad. Goal: Risk for impaired skin integrity will decrease Outcome: Progressing   Problem: Elimination: Goal: Will not experience complications related to urinary retention Outcome: Progressing: urine incontinent. Pt is using condom cath. Good amount of urine output.   Problem: Elimination: Goal: Will not experience complications related to bowel motility Outcome: Progressing  Problem: Clinical Measurements: Goal: Will remain free from infection Outcome: Progressing. Remains afebrile.      Kennyth Lose, RN

## 2021-04-19 NOTE — TOC Progression Note (Signed)
Transition of Care [facility]) - Progression Note    Patient Details  Name: Eduar Kumpf MRN: [id number] Date of Birth: Oct 31, 1945  Transition of Care [facility]) CM/SW [place], [place] Phone Number: 04/19/2021, 2:29 PM  Clinical Narrative:     Verlee Monte  # [id number]  Expected Discharge Plan: [place] Barriers to Discharge: [place] (PASRR), Insurance Authorization, Continued Medical Work up, SNF Pending bed offer  Expected Discharge Plan and Services Expected Discharge Plan: [place] In-house Referral: Clinical Social Work     Living arrangements for the past 2 months: Apartment                                       Social Determinants of Health (SDOH) Interventions    Readmission Risk Interventions No flowsheet data found.

## 2021-04-19 NOTE — TOC Progression Note (Signed)
Transition of Care [facility]) - Progression Note    Patient Details  Name: Scott Garrett MRN: [id number] Date of Birth: 12-19-1945  Transition of Care [facility]) CM/SW [place], [place] Phone Number: 04/19/2021, 3:02 PM  Clinical Narrative:     Patient only had two bed offers- sent referrals to [facility], [place], [facility], [facility] and [facility].  CSW spoke with patient's daughter- provided current bed offers  CSW will continue to follow and assist with discharge planning.  [name], MSW, LCSW Clinical Social Worker     Expected Discharge Plan: Skilled Nursing Facility Barriers to Discharge: [place] Approval (PASRR), Insurance Authorization, Continued Medical Work up, SNF Pending bed offer  Expected Discharge Plan and Services Expected Discharge Plan: [place] In-house Referral: Clinical Social Work     Living arrangements for the past 2 months: Apartment                                       Social Determinants of Health (SDOH) Interventions    Readmission Risk Interventions No flowsheet data found.

## 2021-04-19 NOTE — Progress Notes (Addendum)
  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  No erythema or drainage.  RIGHT GROIN INCISION: His right groin incision has some serous drainage.  There is PTFE here.  Currently he is afebrile.  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  DVT PROPHYLAXIS: He is on subcu heparin.   ONCOLOGY: Appreciate Dr. Gearldine Shown help. He has arranged F/U as an outpt.   Gae Gallop, MD 7:39 AM    04/19/2021 7:27 AM 5 Days Post-Op  Subjective:  sleeping and wakes easily  Tm 99   Vitals:   04/18/21 2312 04/19/21 0316  BP: (!) 141/71 131/66  Pulse: 67 67  Resp: 16 16  Temp: 99 F (37.2 C) 99 F (37.2 C)  SpO2: 99% 99%    Physical Exam: General:  no distress Lungs:  non labored Incisions:  right groin with serosanguinous drainage on bandage; incision looks fine with staples in tact.   Extremities:  right AKA looks good with staples in tact and ecchymosis laterally improved today.   CBC    Component Value Date/Time   WBC 130.0 (HH) 04/19/2021 0517   RBC 2.51 (L) 04/19/2021 0517   HGB 7.5 (L) 04/19/2021 0517   HCT 23.0 (L) 04/19/2021 0517   PLT 178 04/19/2021 0517   MCV 91.6 04/19/2021 0517   MCH 29.9 04/19/2021 0517   MCHC 32.6 04/19/2021 0517   RDW 14.4 04/19/2021 0517   LYMPHSABS 112.4 (H) 04/10/2021 2130   MONOABS 4.0 (H) 04/10/2021 2130   EOSABS 0.0 04/10/2021 2130   BASOSABS 0.0 04/10/2021 2130    BMET    Component Value Date/Time   NA 135 04/19/2021 0517   K 4.1 04/19/2021 0517   CL 107 04/19/2021 0517   CO2 24 04/19/2021 0517   GLUCOSE 89 04/19/2021 0517   BUN 13 04/19/2021 0517   CREATININE 0.65 04/19/2021 0517   CALCIUM 8.0 (L) 04/19/2021 0517   GFRNONAA >60 04/19/2021 0517    INR No results found for: INR   Intake/Output Summary (Last 24 hours) at 04/19/2021 0727 Last data filed at 04/19/2021 0453 Gross per 24 hour  Intake 490 ml  Output 1400 ml  Net -910 ml      Assessment/Plan:  75 y.o. male is s/p:  S/p right CFA to peroneal bypass with PTFE and fasciotomies POD 8 with subsequent right AKA  5 Days Post-Op   -pt doing well.  Right AKA stump ecchymosis better this am.  Still with some serosanguinous drainage from the right groin on bandage but incision looks fine with staples in tact.  Continue to monitor closely due to presence of PTFE.   -DVT prophylaxis:  sq heparin   Leontine Locket, PA-C [facility] [phone] 04/19/2021 7:27 AM

## 2021-04-20 DIAGNOSIS — I70221 Atherosclerosis of native arteries of extremities with rest pain, right leg: Secondary | ICD-10-CM | POA: Diagnosis not present

## 2021-04-20 LAB — BASIC METABOLIC PANEL
Anion gap: 4 — ABNORMAL LOW (ref 5–15)
BUN: 17 mg/dL (ref 8–23)
CO2: 25 mmol/L (ref 22–32)
Calcium: 8 mg/dL — ABNORMAL LOW (ref 8.9–10.3)
Chloride: 104 mmol/L (ref 98–111)
Creatinine, Ser: 0.94 mg/dL (ref 0.61–1.24)
GFR, Estimated: >60 mL/min (ref >60)
Glucose, Bld: 307 mg/dL — ABNORMAL HIGH (ref 70–99)
Potassium: 4.4 mmol/L (ref 3.5–5.1)
Sodium: 133 mmol/L — ABNORMAL LOW (ref 135–145)

## 2021-04-20 LAB — GLUCOSE, CAPILLARY
Glucose-Capillary: 283 mg/dL — ABNORMAL HIGH (ref 70–99)
Glucose-Capillary: 250 mg/dL — ABNORMAL HIGH (ref 70–99)
Glucose-Capillary: 240 mg/dL — ABNORMAL HIGH (ref 70–99)
Glucose-Capillary: 225 mg/dL — ABNORMAL HIGH (ref 70–99)

## 2021-04-20 LAB — CBC
HCT: 24.2 % — ABNORMAL LOW (ref 39.0–52.0)
Hemoglobin: 7.5 g/dL — ABNORMAL LOW (ref 13.0–17.0)
MCH: 29.4 pg (ref 26.0–34.0)
MCHC: 31 g/dL (ref 30.0–36.0)
MCV: 94.9 fL (ref 80.0–100.0)
Platelets: 200 K/uL (ref 150–400)
RBC: 2.55 MIL/uL — ABNORMAL LOW (ref 4.22–5.81)
RDW: 14.6 % (ref 11.5–15.5)
WBC: 128.8 K/uL (ref 4.0–10.5)
nRBC: 0.1 % (ref 0.0–0.2)

## 2021-04-20 LAB — MAGNESIUM: Magnesium: 1.7 mg/dL (ref 1.7–2.4)

## 2021-04-20 LAB — PHOSPHORUS: Phosphorus: 2.5 mg/dL (ref 2.5–4.6)

## 2021-04-20 LAB — LACTIC ACID, PLASMA: Lactic Acid, Venous: 2.5 mmol/L (ref 0.5–1.9)

## 2021-04-20 MED ORDER — SODIUM CHLORIDE 0.9 % IV BOLUS
500.0000 mL | Freq: Once | INTRAVENOUS | Status: AC
  Administered 2021-04-20: 500 mL via INTRAVENOUS

## 2021-04-20 NOTE — Progress Notes (Signed)
Inpatient Diabetes Program Recommendations  AACE/ADA: New Consensus Statement on Inpatient Glycemic Control (2015)  Target Ranges:  Prepandial:   less than 140 mg/dL      Peak postprandial:   less than 180 mg/dL (1-2 hours)      Critically ill patients:  140 - 180 mg/dL   Lab Results  Component Value Date   GLUCAP 250 (H) 04/20/2021   HGBA1C 11.5 (H) 04/10/2021    Review of Glycemic Control Results for ARYAV, WIMBERLY (MRN [id number]) as of 04/20/2021 14:17  Ref. Range 04/19/2021 19:33 04/20/2021 06:32 04/20/2021 12:37  Glucose-Capillary Latest Ref Range: 70 - 99 mg/dL 173 (H) 283 (H) 250 (H)   Diabetes history: DM2 Outpatient Diabetes medications: Metformin 500 mg BID Current orders for Inpatient glycemic control: Semglee 10 daily, Novolog 0-15 TID with meals   Inpatient Diabetes Program Recommendations:     Consider Novolog 2 units TID (assuming patient is consuming >50% of meals).    Thanks, Bronson Curb, MSN, RNC-OB Diabetes Coordinator [phone] (8a-5p)

## 2021-04-20 NOTE — Progress Notes (Signed)
Lab notified RN of critical lactic acid 2.5. RN notified MD. Edwena Blow, RN

## 2021-04-20 NOTE — Progress Notes (Signed)
PROGRESS NOTE    Scott Garrett  SJG:[id number] DOB: 12/19/1945 DOA: 04/10/2021 PCP: [name]     Brief Narrative:  This 75 y.o. male with medical history significant of dementia, DM2, HTN, presents from home (lives alone and has a caregiver who comes in 3 times a week) due to increase confusion and right foot pain.  Work-up revealed right lower extremity critical limb ischemia.  Also incidental findings of leukocytosis with WBC greater than 140 K.  Seen by vascular surgery, was taken to the OR emergently on 04/10/2021 by vascular surgery for Right lower extremity revascularization with fasciotomies.  Also seen by medical oncology for WBC>140K.  Flow cytometry done on 04/11/2021 showed atypical T-cell population which compromises 90% of all lymphocytes.  Consistent with a T-cell lymphoproliferative process.  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  UA positive for pyuria on presentation, started on Rocephin on 04/11/2021.  Patient lost Doppler signals to right foot and right calf muscles despite being on heparin drip.  He was taken back to the OR on 04/14/2021 by vascular surgery [name].  Now status post right AKA.  Hospital course complicated by acute blood loss anemia for which she was transfused total 2 units PRBCs for hemoglobin of 7.3 K on 04/12/2021 and 04/13/2021.  Mild serosanguineous drainage on the bandage at the site of amputation.  Vascular surgery recommended to continue to monitor this site given presence of PTFE graft.  Assessment & Plan:   Principal Problem:   Critical limb ischemia of right lower extremity (HCC) Active Problems:   Dementia (HCC)   DM2 (diabetes mellitus, type 2) (HCC)   HTN (hypertension)   Leukocytosis   Right LE critical limb ischemia /s/p revascularization. Lost doppler signals  despite being on heparin. S/p right AKA: Mild serosanguineous drainage on the bandage at the site of  amputation.   Vascular surgery recommended to continue to monitor this site given presence of PTFE graft. He completed 7-day course of Rocephin Medical oncology recommended to transfuse If hemoglobin less than 7.5. Hemoglobin 7.7> 8.0, continue to closely monitor H&H and transfuse as indicated.   Acute blood loss anemia: He has received total of 2 units PRBCs transfusion during this admission. Heparin drip was discontinued and he was started on SQ heparin 3 times daily on 04/17/2021 Hemoglobin uptrending 8.0 K from 7.7 K. Hb 7.5 today   Newly diagnosed B-cell lymphoproliferative disorder: Confirmed on lymph node biopsy and flow cytometry done on 04/11/2021 Management as per oncology.  Acute kidney injury: > Resolved. Resolved, suspect multifactorial. Encourage oral hydration.   Hypomagnesemia : Replaced and improved.   Acute metabolic encephalopathy, delirium in the setting of acute illness>: Improved. Continue fall precautions Continue to reorient as needed.   Type 2 diabetes with hyperglycemia: Continue Semglee 10 units daily, Regular insulin sliding scale. Hemoglobin A1c 11.4.    Lactic acidosis : Lactic acid 2.8 >2.4 IV fluid bolus, recheck lactic acid.   Dementia with behavioral disturbances. Continue to reorient as needed.   DVT prophylaxis: Heparin sq Code Status: Full code. Family Communication: No family at bed side. Disposition Plan:   Status is: Inpatient  Remains inpatient appropriate because: Critical limb ischemia underwent right AKA  Anticipated  discharge to skilled nursing facility in 1-2 days   Consultants:  Vascular surgery  Procedures:  Revascularization, fasciotomies on 04/10/2021 by vascular surgery [name]. 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    Antimicrobials:   Anti-infectives (From admission, onward)  Start     Dose/Rate Route Frequency Ordered Stop   04/11/21 0845  cefTRIAXone (ROCEPHIN) 1 g in sodium chloride  0.9 % 100 mL IVPB  Status:  Discontinued        1 g 200 mL/hr over 30 Minutes Intravenous Every 24 hours 04/11/21 0746 04/11/21 0747   04/11/21 0845  cefTRIAXone (ROCEPHIN) 2 g in sodium chloride 0.9 % 100 mL IVPB  Status:  Discontinued        2 g 200 mL/hr over 30 Minutes Intravenous Every 24 hours 04/11/21 0747 04/16/21 1413   04/10/21 2030  piperacillin-tazobactam (ZOSYN) IVPB 3.375 g        3.375 g 100 mL/hr over 30 Minutes Intravenous  Once 04/10/21 2015 04/10/21 2220        Subjective: Patient was seen and examined at bedside.  Overnight events noted.   Reports feeling very much improved, he denies any pain.  He is s/p right AKA.  Objective: Vitals:   04/19/21 2337 04/20/21 0336 04/20/21 0821 04/20/21 1239  BP: (!) 114/50 135/62 117/71 122/69  Pulse: 80 76 99 72  Resp: 20 20 20 20   Temp: 98.8 F (37.1 C) 98.6 F (37 C) 98.5 F (36.9 C) 98.7 F (37.1 C)  TempSrc: Oral Oral Oral Oral  SpO2: 99% 98% 100% 100%  Weight:  82.8 kg    Height:        Intake/Output Summary (Last 24 hours) at 04/20/2021 1417 Last data filed at 04/20/2021 1049 Gross per 24 hour  Intake 490 ml  Output 1500 ml  Net -1010 ml   Filed Weights   04/17/21 0318 04/19/21 0316 04/20/21 0336  Weight: 86.4 kg 85.2 kg 82.8 kg    Examination:  General exam: Appears comfortable, not in any acute distress, deconditioned. Respiratory system: Clear to auscultation. Respiratory effort normal. Cardiovascular system: S1-S2 heard, regular rate and rhythm, no murmur. Gastrointestinal system: Abdomen is soft, nontender, nondistended, BS+ Central nervous system: Alert and oriented. No focal neurological deficits. Extremities: Right AKA, staples noted at this time, no signs of erythema. Skin: No rashes, lesions or ulcers Psychiatry: Judgement and insight appear normal. Mood & affect appropriate.     Data Reviewed: I have personally reviewed following labs and imaging studies  CBC: Recent Labs  Lab  04/16/21 0124 04/17/21 0257 04/18/21 0221 04/19/21 0517 04/20/21 0128  WBC 149.9* 135.8* 140.1* 130.0* 128.8*  HGB 7.6* 7.7* 8.0* 7.5* 7.5*  HCT 23.9* 23.9* 24.8* 23.0* 24.2*  MCV 93.0 93.7 92.9 91.6 94.9  PLT 151 162 170 178 629   Basic Metabolic Panel: Recent Labs  Lab 04/14/21 0357 04/15/21 0135 04/16/21 0124 04/19/21 0517 04/20/21 0128  NA 135 132* 135 135 133*  K 4.7 4.7 4.9 4.1 4.4  CL 108 104 107 107 104  CO2 24 23 23 24 25   GLUCOSE 125* 206* 102* 89 307*  BUN 22 17 13 13 17   CREATININE 0.99 1.00 0.80 0.65 0.94  CALCIUM 7.3* 7.4* 7.3* 8.0* 8.0*  MG 1.7  --   --  1.4* 1.7  PHOS  --   --   --  2.1* 2.5   GFR: Estimated Creatinine Clearance: 73.4 mL/min (by C-G formula based on SCr of 0.94 mg/dL). Liver Function Tests: No results for input(s): AST, ALT, ALKPHOS, BILITOT, PROT, ALBUMIN in the last 168 hours. No results for input(s): LIPASE, AMYLASE in the last 168 hours. No results for input(s): AMMONIA in the last 168 hours. Coagulation Profile: No results for input(s): INR,  PROTIME in the last 168 hours. Cardiac Enzymes: No results for input(s): CKTOTAL, CKMB, CKMBINDEX, TROPONINI in the last 168 hours. BNP (last 3 results) No results for input(s): PROBNP in the last 8760 hours. HbA1C: No results for input(s): HGBA1C in the last 72 hours. CBG: Recent Labs  Lab 04/19/21 1129 04/19/21 1559 04/19/21 1933 04/20/21 0632 04/20/21 1237  GLUCAP 162* 105* 173* 283* 250*   Lipid Profile: No results for input(s): CHOL, HDL, LDLCALC, TRIG, CHOLHDL, LDLDIRECT in the last 72 hours. Thyroid Function Tests: No results for input(s): TSH, T4TOTAL, FREET4, T3FREE, THYROIDAB in the last 72 hours. Anemia Panel: No results for input(s): VITAMINB12, FOLATE, FERRITIN, TIBC, IRON, RETICCTPCT in the last 72 hours. Sepsis Labs: Recent Labs  Lab 04/20/21 0909  LATICACIDVEN 2.5*    Recent Results (from the past 240 hour(s))  Resp Panel by RT-PCR (Flu A&B, Covid)  Nasopharyngeal Swab     Status: None   Collection Time: 04/10/21  6:42 PM   Specimen: Nasopharyngeal Swab; Nasopharyngeal(NP) swabs in vial transport medium  Result Value Ref Range Status   SARS Coronavirus 2 by RT PCR NEGATIVE NEGATIVE Final    Comment: (NOTE) SARS-CoV-2 target nucleic acids are NOT DETECTED.  The SARS-CoV-2 RNA is generally detectable in upper respiratory specimens during the acute phase of infection. The lowest concentration of SARS-CoV-2 viral copies this assay can detect is 138 copies/mL. A negative result does not preclude SARS-Cov-2 infection and should not be used as the sole basis for treatment or other patient management decisions. A negative result may occur with  improper specimen collection/handling, submission of specimen other than nasopharyngeal swab, presence of viral mutation(s) within the areas targeted by this assay, and inadequate number of viral copies(<138 copies/mL). A negative result must be combined with clinical observations, patient history, and epidemiological information. The expected result is Negative.  Fact Sheet for Patients:  EntrepreneurPulse.com.au  Fact Sheet for Healthcare Providers:  IncredibleEmployment.be  This test is no t yet approved or cleared by the [place] FDA and  has been authorized for detection and/or diagnosis of SARS-CoV-2 by FDA under an Emergency Use Authorization (EUA). This EUA will remain  in effect (meaning this test can be used) for the duration of the COVID-19 declaration under Section 564(b)(1) of the Act, 21 U.S.C.section 360bbb-3(b)(1), unless the authorization is terminated  or revoked sooner.       Influenza A by PCR NEGATIVE NEGATIVE Final   Influenza B by PCR NEGATIVE NEGATIVE Final    Comment: (NOTE) The Xpert Xpress SARS-CoV-2/FLU/RSV plus assay is intended as an aid in the diagnosis of influenza from Nasopharyngeal swab specimens and should not be  used as a sole basis for treatment. Nasal washings and aspirates are unacceptable for Xpert Xpress SARS-CoV-2/FLU/RSV testing.  Fact Sheet for Patients: EntrepreneurPulse.com.au  Fact Sheet for Healthcare Providers: IncredibleEmployment.be  This test is not yet approved or cleared by the [place] FDA and has been authorized for detection and/or diagnosis of SARS-CoV-2 by FDA under an Emergency Use Authorization (EUA). This EUA will remain in effect (meaning this test can be used) for the duration of the COVID-19 declaration under Section 564(b)(1) of the Act, 21 U.S.C. section 360bbb-3(b)(1), unless the authorization is terminated or revoked.  Performed at [facility], [place] [address] [place], [place] [address]   Culture, blood (routine x 2)     Status: None   Collection Time: 04/10/21  8:55 PM   Specimen: BLOOD LEFT HAND  Result Value Ref Range  Status   Specimen Description BLOOD LEFT HAND  Final   Special Requests   Final    BOTTLES DRAWN AEROBIC AND ANAEROBIC Blood Culture results may not be optimal due to an inadequate volume of blood received in culture bottles   Culture   Final    NO GROWTH 5 DAYS Performed at [facility], [place] [address] [place], [place] [address]    Report Status 04/15/2021 FINAL  Final  Culture, blood (routine x 2)     Status: None   Collection Time: 04/10/21  9:40 PM   Specimen: BLOOD LEFT HAND  Result Value Ref Range Status   Specimen Description BLOOD LEFT HAND  Final   Special Requests   Final    BOTTLES DRAWN AEROBIC AND ANAEROBIC Blood Culture results may not be optimal due to an inadequate volume of blood received in culture bottles   Culture   Final    NO GROWTH 5 DAYS Performed at [facility], [place] [address], [place], [place] [address]    Report Status 04/15/2021 FINAL  Final  Urine Culture     Status: None   Collection Time: 04/12/21  3:50 AM   Specimen: Urine,  Clean Catch  Result Value Ref Range Status   Specimen Description URINE, CLEAN CATCH  Final   Special Requests NONE  Final   Culture   Final    NO GROWTH Performed at [facility], [place] [address] [place], [place] [address]    Report Status 04/12/2021 FINAL  Final  MRSA Next Gen by PCR, Nasal     Status: None   Collection Time: 04/13/21 12:05 PM   Specimen: Nasal Mucosa; Nasal Swab  Result Value Ref Range Status   MRSA by PCR Next Gen NOT DETECTED NOT DETECTED Final    Comment: (NOTE) The GeneXpert MRSA Assay (FDA approved for NASAL specimens only), is one component of a comprehensive MRSA colonization surveillance program. It is not intended to diagnose MRSA infection nor to guide or monitor treatment for MRSA infections. Test performance is not FDA approved in patients less than 48 years old. Performed at [facility], [place] [address], [place], [place] [address]     Radiology Studies: No results found.  Scheduled Meds:  Chlorhexidine Gluconate Cloth  6 each Topical Daily   heparin  5,000 Units Subcutaneous Q8H    HYDROmorphone (DILAUDID) injection  1 mg Intravenous Once   insulin aspart  0-15 Units Subcutaneous TID WC   insulin glargine-yfgn  10 Units Subcutaneous Daily   nicotine  14 mg Transdermal Daily   Continuous Infusions:   LOS: 10 days    Time spent:  25 mins    Shawna Clamp, MD [facility]   If 7PM-7AM, please contact night-coverage

## 2021-04-20 NOTE — Progress Notes (Addendum)
[facility]  Subjective  - Alert and wants sleep   Objective 135/62 76 98.6 F (37 C) (Oral) 20 98%  Intake/Output Summary (Last 24 hours) at 04/20/2021 0729 Last data filed at 04/20/2021 0340 Gross per 24 hour  Intake 250 ml  Output 1900 ml  Net -1650 ml    The right groin dressing has minimal SS drainage Right stump staples intact and healing well Stump warm and appears viable Lungs non labored breathing   Assessment/Planning: POD # 6 Right AKA for ischemic limb Residual PTFE in groin with SS drainage.    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 Appreciate Primary team amangement  DVT prophylaxis SQ heparin    Roxy Horseman 04/20/2021 7:29 AM --  Laboratory Lab Results: Recent Labs    04/19/21 0517 04/20/21 0128  WBC 130.0* 128.8*  HGB 7.5* 7.5*  HCT 23.0* 24.2*  PLT 178 200   BMET Recent Labs    04/19/21 0517 04/20/21 0128  NA 135 133*  K 4.1 4.4  CL 107 104  CO2 24 25  GLUCOSE 89 307*  BUN 13 17  CREATININE 0.65 0.94  CALCIUM 8.0* 8.0*    COAG No results found for: INR, PROTIME No results found for: PTT  I have independently interviewed and examined patient and agree with PA assessment and plan above.   Kawthar Ennen C. Donzetta Matters, MD [facility] Office: [phone] Pager: [phone]

## 2021-04-20 NOTE — TOC Progression Note (Signed)
Transition of Care [facility]) - Progression Note    Patient Details  Name: Scott Garrett MRN: [id number] Date of Birth: Nov 10, 1945  Transition of Care [facility]) CM/SW [place], [place] Phone Number: 04/20/2021, 3:24 PM  Clinical Narrative:     Spoke with patient's daughter, informed no new bed offers- only [facility] and [facility].  SNF will need to start insurance authorization  [name], MSW, LCSW Clinical Social Worker    Expected Discharge Plan: Skilled Nursing Facility Barriers to Discharge: [place] (PASRR), Insurance Authorization, Continued Medical Work up, SNF Pending bed offer  Expected Discharge Plan and Services Expected Discharge Plan: [place] In-house Referral: Clinical Social Work     Living arrangements for the past 2 months: Apartment                                       Social Determinants of Health (SDOH) Interventions    Readmission Risk Interventions No flowsheet data found.

## 2021-04-21 DIAGNOSIS — I70221 Atherosclerosis of native arteries of extremities with rest pain, right leg: Secondary | ICD-10-CM | POA: Diagnosis not present

## 2021-04-21 LAB — GLUCOSE, CAPILLARY
Glucose-Capillary: 165 mg/dL — ABNORMAL HIGH (ref 70–99)
Glucose-Capillary: 152 mg/dL — ABNORMAL HIGH (ref 70–99)
Glucose-Capillary: 212 mg/dL — ABNORMAL HIGH (ref 70–99)
Glucose-Capillary: 160 mg/dL — ABNORMAL HIGH (ref 70–99)

## 2021-04-21 LAB — BASIC METABOLIC PANEL
Anion gap: 5 (ref 5–15)
BUN: 16 mg/dL (ref 8–23)
CO2: 25 mmol/L (ref 22–32)
Calcium: 8 mg/dL — ABNORMAL LOW (ref 8.9–10.3)
Chloride: 105 mmol/L (ref 98–111)
Creatinine, Ser: 0.87 mg/dL (ref 0.61–1.24)
GFR, Estimated: >60 mL/min (ref >60)
Glucose, Bld: 205 mg/dL — ABNORMAL HIGH (ref 70–99)
Potassium: 4.3 mmol/L (ref 3.5–5.1)
Sodium: 135 mmol/L (ref 135–145)

## 2021-04-21 LAB — LACTIC ACID, PLASMA: Lactic Acid, Venous: 1.3 mmol/L (ref 0.5–1.9)

## 2021-04-21 NOTE — Progress Notes (Addendum)
HEMATOLOGY-ONCOLOGY PROGRESS NOTE  SUBJECTIVE: Scott Garrett has no complaint.  No pain. PHYSICAL EXAMINATION:  Vitals:   04/21/21 0002 04/21/21 0427  BP: 106/70 (!) 157/74  Pulse: 76 73  Resp: 19 17  Temp: 99.2 F (37.3 C) 98.7 F (37.1 C)  SpO2: 99% 100%   Filed Weights   04/17/21 0318 04/19/21 0316 04/20/21 0336  Weight: 190 lb 7.6 oz (86.4 kg) 187 lb 13.3 oz (85.2 kg) 182 lb 8.7 oz (82.8 kg)    Intake/Output from previous day: 10/27 0701 - 10/28 0700 In: 720 [P.O.:720] Out: 300 [Urine:300]  GENERAL: Awake and alert, no distress Lymph nodes: Soft mobile 1 cm bilateral cervical and 1-2 cm axillary nodes ABDOMEN:abdomen soft, non-tender, no hepatomegaly, spleen tip? EXT: R AKA, staple line without erythema or bleeding NEURO: Alert, follows commands  LABORATORY DATA:  I have reviewed the data as listed CMP Latest Ref Rng & Units 04/21/2021 04/20/2021 04/19/2021  Glucose 70 - 99 mg/dL 205(H) 307(H) 89  BUN 8 - 23 mg/dL 16 17 13   Creatinine 0.61 - 1.24 mg/dL 0.87 0.94 0.65  Sodium 135 - 145 mmol/L 135 133(L) 135  Potassium 3.5 - 5.1 mmol/L 4.3 4.4 4.1  Chloride 98 - 111 mmol/L 105 104 107  CO2 22 - 32 mmol/L 25 25 24   Calcium 8.9 - 10.3 mg/dL 8.0(L) 8.0(L) 8.0(L)    Lab Results  Component Value Date   WBC 128.8 (HH) 04/20/2021   HGB 7.5 (L) 04/20/2021   HCT 24.2 (L) 04/20/2021   MCV 94.9 04/20/2021   PLT 200 04/20/2021   NEUTROABS 15.9 (H) 04/10/2021    DG Chest Port 1 View  Result Date: 04/10/2021 CLINICAL DATA:  Weakness EXAM: PORTABLE CHEST 1 VIEW COMPARISON:  None. FINDINGS: Heart and mediastinal contours are within normal limits. No focal opacities or effusions. No acute bony abnormality. IMPRESSION: No active disease. Electronically Signed   By: Rolm Baptise M.D.   On: 04/10/2021 20:31   CT CHEST ABDOMEN PELVIS WO CONTRAST  Result Date: 04/13/2021 CLINICAL DATA:  New diagnosis CLL, staging EXAM: CT CHEST, ABDOMEN AND PELVIS WITHOUT CONTRAST TECHNIQUE:  Multidetector CT imaging of the chest, abdomen and pelvis was performed following the standard protocol without IV contrast. COMPARISON:  None. FINDINGS: CT CHEST FINDINGS Cardiovascular: Aortic atherosclerosis. Normal heart size. Three-vessel coronary artery calcifications. No pericardial effusion. Mediastinum/Nodes: Prominent bilateral axillary lymph nodes, largest right axillary nodes measuring up to 1.8 x 1.3 cm (series 3, image 21). No enlarged mediastinal or hilar lymph nodes. Thyroid gland, trachea, and esophagus demonstrate no significant findings. Lungs/Pleura: Mild centrilobular emphysema. Small bilateral pleural effusions and associated atelectasis or consolidation. Underlying bandlike scarring of the bilateral lung bases, right greater than left. Musculoskeletal: No chest wall mass or suspicious bone lesions identified. Anasarca. CT ABDOMEN PELVIS FINDINGS Hepatobiliary: No solid liver abnormality is seen. No gallstones, gallbladder wall thickening, or biliary dilatation. Pancreas: Unremarkable. No pancreatic ductal dilatation or surrounding inflammatory changes. Spleen: Splenomegaly, maximum span 14.8 cm. Adrenals/Urinary Tract: Adrenal glands are unremarkable. Kidneys are normal, without renal calculi, solid lesion, or hydronephrosis. Bladder is unremarkable. Stomach/Bowel: Stomach is within normal limits. Appendix appears normal. No evidence of bowel wall thickening, distention, or inflammatory changes. Vascular/Lymphatic: Aortic atherosclerosis. Enlarged bilateral iliac, pelvic sidewall, and inguinal lymph nodes, largest left iliac node measuring 2.4 x 1.5 cm (series 3, image 98), largest right pelvic sidewall lymph node measuring 2.5 x 1.2 cm (series 3, image 106), and largest right inguinal nodes measuring 1.4 x 1.2 cm (series 3,  image 122). Reproductive: No mass or other abnormality. Other: No abdominal wall hernia. Anasarca. Incision over the right femoral vessels with a subcutaneous hematoma or  seroma measuring 5.4 x 3.2 cm. Scattered small air loculations within this collection (series 3, image 116). No abdominopelvic ascites. Musculoskeletal: No acute or significant osseous findings. IMPRESSION: 1. Enlarged bilateral axillary, iliac, pelvic sidewall, and inguinal lymph nodes, as detailed above and in keeping with reported diagnosis of CLL. There is no lymphadenopathy internal to the chest or in the upper abdomen. 2. Splenomegaly. 3. Incision over the right femoral vessels with a subcutaneous hematoma or seroma measuring 5.4 x 3.2 cm. Scattered small air loculations within this collection. Findings are consistent with recent biopsy. The presence or absence of infection is not established by CT. 4. Small bilateral pleural effusions and associated atelectasis or consolidation. Underlying bandlike scarring of the bilateral lung bases, right greater than left. 5. Mild emphysema. 6. Coronary artery disease. Aortic Atherosclerosis (ICD10-I70.0) and Emphysema (ICD10-J43.9). Electronically Signed   By: Delanna Ahmadi M.D.   On: 04/13/2021 16:04    ASSESSMENT AND PLAN: 1.  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  Normocytic anemia 3.  Mild thrombocytopenia 4.  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  Dementia 6.  Diabetes mellitus 7.  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  Tobacco dependence 10.  Alcohol use  [name] appears unchanged.  He appears asymptomatic from the lymphoproliferative disorder.  The CBC was stable yesterday.  He appears to have a low-grade lymphoproliferative  disorder.  There is no indication for treatment at present.  I am waiting on further clarification from pathology regarding the subclassification of the leukemia/lymphoma.  He continues postoperative care per the medical service and vascular surgery.  He is waiting on skilled nursing facility placement.  I discussed the case with his daughter, Scott Garrett, by telephone today.  Recommendations: Postoperative care/discharge plans per the medical and surgical services Please call oncology as needed.  I will check on him next week if he remains in the hospital.  Outpatient follow-up will be scheduled at the Cancer center.     LOS: 11 days   Betsy Coder, MD

## 2021-04-21 NOTE — TOC Progression Note (Signed)
Transition of Care [facility]) - Progression Note    Patient Details  Name: Scott Garrett MRN: [id number] Date of Birth: 12-17-1945  Transition [facility] Care [facility]) CM/SW [place], [place] Phone Number: 04/21/2021, 4:14 PM  Clinical Narrative:     Pt's dtr chose [facility]. Facility was notified to initiate authorization, they stated it will likely be next week before it is received. TOC will continue to follow for DC needs.  Expected Discharge Plan: Skilled Nursing Facility Barriers to Discharge: [place] (PASRR), Insurance Authorization, Continued Medical Work up, SNF Pending bed offer  Expected Discharge Plan and Services Expected Discharge Plan: [place] In-house Referral: Clinical Social Work     Living arrangements for the past 2 months: Apartment                                       Social Determinants of Health (SDOH) Interventions    Readmission Risk Interventions No flowsheet data found.

## 2021-04-21 NOTE — Progress Notes (Addendum)
      Subjective  - More alert and talkative this am   Objective (!) 157/74 73 98.7 F (37.1 C) (Oral) 17 100%  Intake/Output Summary (Last 24 hours) at 04/21/2021 0723 Last data filed at 04/20/2021 1300 Gross per 24 hour  Intake 720 ml  Output 300 ml  Net 420 ml    Right AKA stump healing well appears viable, staples intact Right groin staples intact with SS drainage, dressing changed this am.  No erythema, no purulent drainage   Assessment/Planning: POD # 6 Right AKA for ischemic limb Residual PTFE in groin with SS drainage.    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 Appreciate Primary team amangement  Cont. Dressing changes as needed to right groin for drainage  Roxy Horseman 04/21/2021 7:23 AM --  Laboratory Lab Results: Recent Labs    04/19/21 0517 04/20/21 0128  WBC 130.0* 128.8*  HGB 7.5* 7.5*  HCT 23.0* 24.2*  PLT 178 200   BMET Recent Labs    04/20/21 0128 04/21/21 0128  NA 133* 135  K 4.4 4.3  CL 104 105  CO2 25 25  GLUCOSE 307* 205*  BUN 17 16  CREATININE 0.94 0.87  CALCIUM 8.0* 8.0*    COAG No results found for: INR, PROTIME No results found for: PTT  I have independently interviewed and examined patient and agree with PA assessment and plan above. Will be available as needed this weekend. Will f/u in our office for staple removal.  Mickal Meno C. Donzetta Matters, MD [facility] Office: [phone] Pager: [phone]

## 2021-04-21 NOTE — Progress Notes (Signed)
PROGRESS NOTE    Scott Garrett  ZOX:[id number] DOB: Jul 06, 1945 DOA: 04/10/2021 PCP: [name]     Brief Narrative:  This 75 y.o. male with medical history significant of dementia, DM2, HTN, presents from home (lives alone and has a caregiver who comes in 3 times a week) due to increase confusion and right foot pain.  Work-up revealed right lower extremity critical limb ischemia.  Also incidental findings of leukocytosis with WBC greater than 140 K.  Seen by vascular surgery, was taken to the OR emergently on 04/10/2021 by vascular surgery for Right lower extremity revascularization with fasciotomies.  Also seen by medical oncology for WBC>140K.  Flow cytometry done on 04/11/2021 showed atypical T-cell population which compromises 90% of all lymphocytes.  Consistent with a T-cell lymphoproliferative process.  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  UA positive for pyuria on presentation, started on Rocephin on 04/11/2021.  Patient lost Doppler signals to right foot and right calf muscles despite being on heparin drip.  He was taken back to the OR on 04/14/2021 by vascular surgery [name].  Now status post right AKA.  Hospital course complicated by acute blood loss anemia for which she was transfused total 2 units PRBCs for hemoglobin of 7.3 K on 04/12/2021 and 04/13/2021.  Mild serosanguineous drainage on the bandage at the site of amputation.  Vascular surgery recommended to continue to monitor this site given presence of PTFE graft.  Assessment & Plan:   Principal Problem:   Critical limb ischemia of right lower extremity (HCC) Active Problems:   Dementia (HCC)   DM2 (diabetes mellitus, type 2) (HCC)   HTN (hypertension)   Leukocytosis   Right LE critical limb ischemia /s/p revascularization. Lost doppler signals  despite being on heparin. S/p right AKA: Mild serosanguineous drainage on the bandage at the site of  amputation.   Vascular surgery recommended to continue to monitor this site given presence of PTFE graft. He completed 7-day course of Rocephin Medical oncology recommended to transfuse If hemoglobin less than 7.5. Hemoglobin 7.7> 8.0, continue to closely monitor H&H and transfuse as indicated.   Acute blood loss anemia: He has received total of 2 units PRBCs transfusion during this admission. Heparin drip was discontinued and he was started on SQ heparin 3 times daily on 04/17/2021 Hemoglobin uptrending 8.0 K from 7.7 K. Hb 7.5 yesterday.   Newly diagnosed B-cell lymphoproliferative disorder: Confirmed on lymph node biopsy and flow cytometry done on 04/11/2021 There is no indication for treatment at present.  Acute kidney injury: > Resolved. Resolved, suspect multifactorial. Encourage oral hydration.   Hypomagnesemia : Replaced and improved.   Acute metabolic encephalopathy, delirium in the setting of acute illness>: Improved. Continue fall precautions Continue to reorient as needed.   Type 2 diabetes with hyperglycemia: Continue Semglee 10 units daily, Regular insulin sliding scale. Hemoglobin A1c 11.4.    Lactic acidosis : . Resolved. Lactic acid 2.8 >2.4>1.5 IV fluid bolus, recheck lactic acid.   Dementia with behavioral disturbances. Continue to reorient as needed.   DVT prophylaxis: Heparin sq Code Status: Full code. Family Communication: No family at bed side. Disposition Plan:   Status is: Inpatient  Remains inpatient appropriate because: Critical limb ischemia underwent right AKA  Anticipated  discharge to skilled nursing facility in 1-2 days   Consultants:  Vascular surgery  Procedures:  Revascularization, fasciotomies on 04/10/2021 by vascular surgery [name]. 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    Antimicrobials:  Anti-infectives (From admission, onward)    Start     Dose/Rate Route Frequency Ordered Stop   04/11/21 0845   cefTRIAXone (ROCEPHIN) 1 g in sodium chloride 0.9 % 100 mL IVPB  Status:  Discontinued        1 g 200 mL/hr over 30 Minutes Intravenous Every 24 hours 04/11/21 0746 04/11/21 0747   04/11/21 0845  cefTRIAXone (ROCEPHIN) 2 g in sodium chloride 0.9 % 100 mL IVPB  Status:  Discontinued        2 g 200 mL/hr over 30 Minutes Intravenous Every 24 hours 04/11/21 0747 04/16/21 1413   04/10/21 2030  piperacillin-tazobactam (ZOSYN) IVPB 3.375 g        3.375 g 100 mL/hr over 30 Minutes Intravenous  Once 04/10/21 2015 04/10/21 2220        Subjective: Patient was seen and examined at bedside.  Overnight events noted.   Patient reports feeling much improved.  Wound looks clean,  there is no drainage from the stump, staples noted but no erythema.  He is s/p right AKA.  Objective: Vitals:   04/21/21 0002 04/21/21 0427 04/21/21 0841 04/21/21 1151  BP: 106/70 (!) 157/74 127/75 118/71  Pulse: 76 73 81 74  Resp: 19 17 18 19   Temp: 99.2 F (37.3 C) 98.7 F (37.1 C) 98.7 F (37.1 C) 98.4 F (36.9 C)  TempSrc: Oral Oral Oral Oral  SpO2: 99% 100% 99% 99%  Weight:      Height:        Intake/Output Summary (Last 24 hours) at 04/21/2021 1357 Last data filed at 04/21/2021 1300 Gross per 24 hour  Intake 240 ml  Output 1000 ml  Net -760 ml    Filed Weights   04/17/21 0318 04/19/21 0316 04/20/21 0336  Weight: 86.4 kg 85.2 kg 82.8 kg    Examination:  General exam: Appears comfortable, not in any acute distress, deconditioned. Respiratory system: Clear to auscultation. Respiratory effort normal. Cardiovascular system: S1-S2 heard, regular rate and rhythm, no murmur. Gastrointestinal system: Abdomen is soft, nontender, nondistended, BS+ Central nervous system: Alert and oriented. No focal neurological deficits. Extremities: Right AKA, staples noted at the site,  no signs of erythema. Skin: No rashes, lesions or ulcers Psychiatry: Judgement and insight appear normal. Mood & affect appropriate.      Data Reviewed: I have personally reviewed following labs and imaging studies  CBC: Recent Labs  Lab 04/16/21 0124 04/17/21 0257 04/18/21 0221 04/19/21 0517 04/20/21 0128  WBC 149.9* 135.8* 140.1* 130.0* 128.8*  HGB 7.6* 7.7* 8.0* 7.5* 7.5*  HCT 23.9* 23.9* 24.8* 23.0* 24.2*  MCV 93.0 93.7 92.9 91.6 94.9  PLT 151 162 170 178 078    Basic Metabolic Panel: Recent Labs  Lab 04/15/21 0135 04/16/21 0124 04/19/21 0517 04/20/21 0128 04/21/21 0128  NA 132* 135 135 133* 135  K 4.7 4.9 4.1 4.4 4.3  CL 104 107 107 104 105  CO2 23 23 24 25 25   GLUCOSE 206* 102* 89 307* 205*  BUN 17 13 13 17 16   CREATININE 1.00 0.80 0.65 0.94 0.87  CALCIUM 7.4* 7.3* 8.0* 8.0* 8.0*  MG  --   --  1.4* 1.7  --   PHOS  --   --  2.1* 2.5  --     GFR: Estimated Creatinine Clearance: 79.3 mL/min (by C-G formula based on SCr of 0.87 mg/dL). Liver Function Tests: No results for input(s): AST, ALT, ALKPHOS, BILITOT, PROT, ALBUMIN in the last 168 hours. No results for  input(s): LIPASE, AMYLASE in the last 168 hours. No results for input(s): AMMONIA in the last 168 hours. Coagulation Profile: No results for input(s): INR, PROTIME in the last 168 hours. Cardiac Enzymes: No results for input(s): CKTOTAL, CKMB, CKMBINDEX, TROPONINI in the last 168 hours. BNP (last 3 results) No results for input(s): PROBNP in the last 8760 hours. HbA1C: No results for input(s): HGBA1C in the last 72 hours. CBG: Recent Labs  Lab 04/20/21 1237 04/20/21 1620 04/20/21 2121 04/21/21 0642 04/21/21 1147  GLUCAP 250* 240* 225* 165* 152*    Lipid Profile: No results for input(s): CHOL, HDL, LDLCALC, TRIG, CHOLHDL, LDLDIRECT in the last 72 hours. Thyroid Function Tests: No results for input(s): TSH, T4TOTAL, FREET4, T3FREE, THYROIDAB in the last 72 hours. Anemia Panel: No results for input(s): VITAMINB12, FOLATE, FERRITIN, TIBC, IRON, RETICCTPCT in the last 72 hours. Sepsis Labs: Recent Labs  Lab 04/20/21 0909  04/21/21 9242  LATICACIDVEN 2.5* 1.3     Recent Results (from the past 240 hour(s))  Urine Culture     Status: None   Collection Time: 04/12/21  3:50 AM   Specimen: Urine, Clean Catch  Result Value Ref Range Status   Specimen Description URINE, CLEAN CATCH  Final   Special Requests NONE  Final   Culture   Final    NO GROWTH Performed at [facility], [place] [address] [place], [place] [address]    Report Status 04/12/2021 FINAL  Final  MRSA Next Gen by PCR, Nasal     Status: None   Collection Time: 04/13/21 12:05 PM   Specimen: Nasal Mucosa; Nasal Swab  Result Value Ref Range Status   MRSA by PCR Next Gen NOT DETECTED NOT DETECTED Final    Comment: (NOTE) The GeneXpert MRSA Assay (FDA approved for NASAL specimens only), is one component of a comprehensive MRSA colonization surveillance program. It is not intended to diagnose MRSA infection nor to guide or monitor treatment for MRSA infections. Test performance is not FDA approved in patients less than 63 years old. Performed at [facility], [place] [address], [place], [place] [address]      Radiology Studies: No results found.  Scheduled Meds:  Chlorhexidine Gluconate Cloth  6 each Topical Daily   heparin  5,000 Units Subcutaneous Q8H    HYDROmorphone (DILAUDID) injection  1 mg Intravenous Once   insulin aspart  0-15 Units Subcutaneous TID WC   insulin glargine-yfgn  10 Units Subcutaneous Daily   nicotine  14 mg Transdermal Daily   Continuous Infusions:   LOS: 11 days    Time spent:  25 mins    Brooklynn Brandenburg, MD [facility]   If 7PM-7AM, please contact night-coverage

## 2021-04-21 NOTE — TOC Progression Note (Signed)
Transition of Care [facility]) - Progression Note    Patient Details  Name: Scott Garrett MRN: [id number] Date of Birth: [birth year]  Transition of Care [facility]) CM/SW [place], [place] Phone Number: 04/21/2021, 12:47 PM  Clinical Narrative:     CSW spoke with pt's daughter to get bed choice. Dtr states she is touring [facility] today and will let CSW know her choice afterward. TOC will continue to follow for DC needs.  Expected Discharge Plan: Skilled Nursing Facility Barriers to Discharge: [place] (PASRR), Insurance Authorization, Continued Medical Work up, SNF Pending bed offer  Expected Discharge Plan and Services Expected Discharge Plan: [place] In-house Referral: Clinical Social Work     Living arrangements for the past 2 months: Apartment                                       Social Determinants of Health (SDOH) Interventions    Readmission Risk Interventions No flowsheet data found.

## 2021-04-22 DIAGNOSIS — I70221 Atherosclerosis of native arteries of extremities with rest pain, right leg: Secondary | ICD-10-CM | POA: Diagnosis not present

## 2021-04-22 LAB — CBC
HCT: 23.6 % — ABNORMAL LOW (ref 39.0–52.0)
Hemoglobin: 7.5 g/dL — ABNORMAL LOW (ref 13.0–17.0)
MCH: 30.4 pg (ref 26.0–34.0)
MCHC: 31.8 g/dL (ref 30.0–36.0)
MCV: 95.5 fL (ref 80.0–100.0)
Platelets: 199 10*3/uL (ref 150–400)
RBC: 2.47 MIL/uL — ABNORMAL LOW (ref 4.22–5.81)
RDW: 15.5 % (ref 11.5–15.5)
WBC: 125.4 10*3/uL (ref 4.0–10.5)
nRBC: 0 % (ref 0.0–0.2)

## 2021-04-22 LAB — GLUCOSE, CAPILLARY
Glucose-Capillary: 157 mg/dL — ABNORMAL HIGH (ref 70–99)
Glucose-Capillary: 203 mg/dL — ABNORMAL HIGH (ref 70–99)
Glucose-Capillary: 186 mg/dL — ABNORMAL HIGH (ref 70–99)
Glucose-Capillary: 114 mg/dL — ABNORMAL HIGH (ref 70–99)

## 2021-04-22 NOTE — Progress Notes (Signed)
PROGRESS NOTE    Scott Garrett  QHU:[id number] DOB: [birth year] DOA: 04/10/2021 PCP: [name]     Brief Narrative:  This 75 y.o. male with medical history significant of dementia, DM2, HTN, presents from home (lives alone and has a caregiver who comes in 3 times a week) due to increase confusion and right foot pain.  Work-up revealed right lower extremity critical limb ischemia.  Also incidental findings of leukocytosis with WBC greater than 140 K.  Seen by vascular surgery, was taken to the OR emergently on 04/10/2021 by vascular surgery for Right lower extremity revascularization with fasciotomies.  Also seen by medical oncology for WBC>140K.  Flow cytometry done on 04/11/2021 showed atypical T-cell population which compromises 90% of all lymphocytes.  Consistent with a T-cell lymphoproliferative process.  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  UA positive for pyuria on presentation, started on Rocephin on 04/11/2021.  Patient lost Doppler signals to right foot and right calf muscles despite being on heparin drip.  He was taken back to the OR on 04/14/2021 by vascular surgery [name].  Now status post right AKA.  Hospital course complicated by acute blood loss anemia for which she was transfused total 2 units PRBCs for hemoglobin of 7.3 K on 04/12/2021 and 04/13/2021.  Mild serosanguineous drainage on the bandage at the site of amputation.  Vascular surgery recommended to continue to monitor this site given presence of PTFE graft.  Assessment & Plan:   Principal Problem:   Critical limb ischemia of right lower extremity (HCC) Active Problems:   Dementia (HCC)   DM2 (diabetes mellitus, type 2) (HCC)   HTN (hypertension)   Leukocytosis   Right LE critical limb ischemia /s/p revascularization. Lost doppler signals  despite being on heparin. S/p right AKA: Mild serosanguineous drainage on the bandage at the site of  amputation.   Vascular surgery recommended to  monitor this site given presence of PTFE graft. He completed 7-day course of Rocephin Medical oncology recommended to transfuse If hemoglobin less than 7.5. Hemoglobin 7.7> 8.0,  closely monitor H&H and transfuse as indicated.   Acute blood loss anemia: He has received total of 2 units PRBCs transfusion during this admission. Heparin drip was discontinued and he was started on SQ heparin 3 times daily on 04/17/2021 Hemoglobin uptrending 8.0 K from 7.7 K. Hb 7.5 today.   Newly diagnosed B-cell lymphoproliferative disorder: Confirmed on lymph node biopsy and flow cytometry done on 04/11/2021 Oncology states there is no indication for treatment at present.  Acute kidney injury: > Resolved. Resolved, suspect multifactorial. Encourage oral hydration.   Hypomagnesemia : Replaced and improved.   Acute metabolic encephalopathy, delirium in the setting of acute illness>: Improved. Continue fall precautions Continue to reorient as needed.   Type 2 diabetes with hyperglycemia: Continue Semglee 10 units daily, Regular insulin sliding scale. Hemoglobin A1c 11.4.    Lactic acidosis : . Resolved. Lactic acid 2.8 >2.4>1.5    Dementia with behavioral disturbances. Continue to reorient as needed.   DVT prophylaxis: Heparin sq Code Status: Full code. Family Communication: No family at bed side. Disposition Plan:   Status is: Inpatient  Remains inpatient appropriate because: Critical limb ischemia underwent right AKA  Anticipated  discharge to skilled nursing facility in 1-2 days   Consultants:  Vascular surgery  Procedures:  Revascularization, fasciotomies on 04/10/2021 by vascular surgery [name]. 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    Antimicrobials:   Anti-infectives (From admission, onward)  Start     Dose/Rate Route Frequency Ordered Stop   04/11/21 0845  cefTRIAXone (ROCEPHIN) 1 g in sodium chloride 0.9 %  100 mL IVPB  Status:  Discontinued        1 g 200 mL/hr over 30 Minutes Intravenous Every 24 hours 04/11/21 0746 04/11/21 0747   04/11/21 0845  cefTRIAXone (ROCEPHIN) 2 g in sodium chloride 0.9 % 100 mL IVPB  Status:  Discontinued        2 g 200 mL/hr over 30 Minutes Intravenous Every 24 hours 04/11/21 0747 04/16/21 1413   04/10/21 2030  piperacillin-tazobactam (ZOSYN) IVPB 3.375 g        3.375 g 100 mL/hr over 30 Minutes Intravenous  Once 04/10/21 2015 04/10/21 2220        Subjective: Patient was seen and examined at bedside.  Overnight events noted.   Patient reports feeling better.  Wound looks clean, there is no drainage from the stump, staples noted but there is no erythema.   He is s/p right AKA.  Objective: Vitals:   04/21/21 1942 04/21/21 2317 04/22/21 0359 04/22/21 0824  BP: (!) 108/50 126/63 121/61 (!) 124/101  Pulse: 77 70 73 73  Resp: 19 20 16 13   Temp: 99.1 F (37.3 C) 98.7 F (37.1 C) 99.1 F (37.3 C) 99 F (37.2 C)  TempSrc: Oral Oral Oral Oral  SpO2: 99% 100% 96% 92%  Weight:      Height:        Intake/Output Summary (Last 24 hours) at 04/22/2021 1131 Last data filed at 04/22/2021 0649 Gross per 24 hour  Intake 480 ml  Output 1500 ml  Net -1020 ml   Filed Weights   04/17/21 0318 04/19/21 0316 04/20/21 0336  Weight: 86.4 kg 85.2 kg 82.8 kg    Examination:  General exam: Appears comfortable, not in any acute distress, deconditioned. Appears pleasant Respiratory system: Clear to auscultation. Respiratory effort normal.RR 15. Cardiovascular system: S1-S2 heard, regular rate and rhythm, no murmur. Gastrointestinal system: Abdomen is soft, nontender, nondistended, BS+ Central nervous system: Alert and oriented. No focal neurological deficits. Extremities: Right AKA, staples noted at the site,  no signs of erythema. Skin: No rashes, lesions or ulcers Psychiatry: Judgement and insight appear normal. Mood & affect appropriate.     Data Reviewed: I  have personally reviewed following labs and imaging studies  CBC: Recent Labs  Lab 04/17/21 0257 04/18/21 0221 04/19/21 0517 04/20/21 0128 04/22/21 0050  WBC 135.8* 140.1* 130.0* 128.8* 125.4*  HGB 7.7* 8.0* 7.5* 7.5* 7.5*  HCT 23.9* 24.8* 23.0* 24.2* 23.6*  MCV 93.7 92.9 91.6 94.9 95.5  PLT 162 170 178 200 562   Basic Metabolic Panel: Recent Labs  Lab 04/16/21 0124 04/19/21 0517 04/20/21 0128 04/21/21 0128  NA 135 135 133* 135  K 4.9 4.1 4.4 4.3  CL 107 107 104 105  CO2 23 24 25 25   GLUCOSE 102* 89 307* 205*  BUN 13 13 17 16   CREATININE 0.80 0.65 0.94 0.87  CALCIUM 7.3* 8.0* 8.0* 8.0*  MG  --  1.4* 1.7  --   PHOS  --  2.1* 2.5  --    GFR: Estimated Creatinine Clearance: 79.3 mL/min (by C-G formula based on SCr of 0.87 mg/dL). Liver Function Tests: No results for input(s): AST, ALT, ALKPHOS, BILITOT, PROT, ALBUMIN in the last 168 hours. No results for input(s): LIPASE, AMYLASE in the last 168 hours. No results for input(s): AMMONIA in the last 168 hours. Coagulation Profile: No  results for input(s): INR, PROTIME in the last 168 hours. Cardiac Enzymes: No results for input(s): CKTOTAL, CKMB, CKMBINDEX, TROPONINI in the last 168 hours. BNP (last 3 results) No results for input(s): PROBNP in the last 8760 hours. HbA1C: No results for input(s): HGBA1C in the last 72 hours. CBG: Recent Labs  Lab 04/21/21 0642 04/21/21 1147 04/21/21 1626 04/21/21 2051 04/22/21 0550  GLUCAP 165* 152* 212* 160* 157*   Lipid Profile: No results for input(s): CHOL, HDL, LDLCALC, TRIG, CHOLHDL, LDLDIRECT in the last 72 hours. Thyroid Function Tests: No results for input(s): TSH, T4TOTAL, FREET4, T3FREE, THYROIDAB in the last 72 hours. Anemia Panel: No results for input(s): VITAMINB12, FOLATE, FERRITIN, TIBC, IRON, RETICCTPCT in the last 72 hours. Sepsis Labs: Recent Labs  Lab 04/20/21 0909 04/21/21 8676  LATICACIDVEN 2.5* 1.3    Recent Results (from the past 240 hour(s))   MRSA Next Gen by PCR, Nasal     Status: None   Collection Time: 04/13/21 12:05 PM   Specimen: Nasal Mucosa; Nasal Swab  Result Value Ref Range Status   MRSA by PCR Next Gen NOT DETECTED NOT DETECTED Final    Comment: (NOTE) The GeneXpert MRSA Assay (FDA approved for NASAL specimens only), is one component of a comprehensive MRSA colonization surveillance program. It is not intended to diagnose MRSA infection nor to guide or monitor treatment for MRSA infections. Test performance is not FDA approved in patients less than 44 years old. Performed at [facility], [place] [address] [place], [place] [address]      Radiology Studies: No results found.  Scheduled Meds:  Chlorhexidine Gluconate Cloth  6 each Topical Daily   heparin  5,000 Units Subcutaneous Q8H    HYDROmorphone (DILAUDID) injection  1 mg Intravenous Once   insulin aspart  0-15 Units Subcutaneous TID WC   insulin glargine-yfgn  10 Units Subcutaneous Daily   nicotine  14 mg Transdermal Daily   Continuous Infusions:   LOS: 12 days    Time spent:  25 mins    Shawna Clamp, MD [facility]   If 7PM-7AM, please contact night-coverage

## 2021-04-23 ENCOUNTER — Inpatient Hospital Stay (HOSPITAL_COMMUNITY): Payer: No Typology Code available for payment source

## 2021-04-23 DIAGNOSIS — I70221 Atherosclerosis of native arteries of extremities with rest pain, right leg: Secondary | ICD-10-CM | POA: Diagnosis not present

## 2021-04-23 DIAGNOSIS — Z01810 Encounter for preprocedural cardiovascular examination: Secondary | ICD-10-CM

## 2021-04-23 DIAGNOSIS — L8990 Pressure ulcer of unspecified site, unspecified stage: Secondary | ICD-10-CM | POA: Insufficient documentation

## 2021-04-23 LAB — GLUCOSE, CAPILLARY
Glucose-Capillary: 160 mg/dL — ABNORMAL HIGH (ref 70–99)
Glucose-Capillary: 169 mg/dL — ABNORMAL HIGH (ref 70–99)
Glucose-Capillary: 171 mg/dL — ABNORMAL HIGH (ref 70–99)
Glucose-Capillary: 197 mg/dL — ABNORMAL HIGH (ref 70–99)
Glucose-Capillary: 294 mg/dL — ABNORMAL HIGH (ref 70–99)
Glucose-Capillary: 129 mg/dL — ABNORMAL HIGH (ref 70–99)

## 2021-04-23 LAB — CBC
HCT: 24.2 % — ABNORMAL LOW (ref 39.0–52.0)
Hemoglobin: 7.6 g/dL — ABNORMAL LOW (ref 13.0–17.0)
MCH: 30.3 pg (ref 26.0–34.0)
MCHC: 31.4 g/dL (ref 30.0–36.0)
MCV: 96.4 fL (ref 80.0–100.0)
Platelets: 192 10*3/uL (ref 150–400)
RBC: 2.51 MIL/uL — ABNORMAL LOW (ref 4.22–5.81)
RDW: 15.6 % — ABNORMAL HIGH (ref 11.5–15.5)
WBC: 144.7 10*3/uL (ref 4.0–10.5)
nRBC: 0.1 % (ref 0.0–0.2)

## 2021-04-23 LAB — BASIC METABOLIC PANEL
Anion gap: 5 (ref 5–15)
BUN: 18 mg/dL (ref 8–23)
CO2: 26 mmol/L (ref 22–32)
Calcium: 8.4 mg/dL — ABNORMAL LOW (ref 8.9–10.3)
Chloride: 105 mmol/L (ref 98–111)
Creatinine, Ser: 0.83 mg/dL (ref 0.61–1.24)
GFR, Estimated: >60 mL/min (ref >60)
Glucose, Bld: 158 mg/dL — ABNORMAL HIGH (ref 70–99)
Potassium: 4.6 mmol/L (ref 3.5–5.1)
Sodium: 136 mmol/L (ref 135–145)

## 2021-04-23 MED ORDER — SODIUM CHLORIDE 0.9 % IV SOLN
2.0000 g | INTRAVENOUS | Status: DC
  Administered 2021-04-23 – 2021-04-26 (×4): 2 g via INTRAVENOUS
  Filled 2021-04-23 (×5): qty 20

## 2021-04-23 MED ORDER — VANCOMYCIN HCL 1500 MG/300ML IV SOLN
1500.0000 mg | Freq: Once | INTRAVENOUS | Status: AC
  Administered 2021-04-23: 1500 mg via INTRAVENOUS
  Filled 2021-04-23: qty 300

## 2021-04-23 MED ORDER — VANCOMYCIN HCL IN DEXTROSE 1-5 GM/200ML-% IV SOLN
1000.0000 mg | Freq: Two times a day (BID) | INTRAVENOUS | Status: DC
  Administered 2021-04-23 – 2021-04-26 (×6): 1000 mg via INTRAVENOUS
  Filled 2021-04-23 (×6): qty 200

## 2021-04-23 NOTE — Progress Notes (Signed)
VASCULAR LAB    Right lower extremity arterial duplex of the groin has been performed.  See CV proc for preliminary results.   Sherra Kimmons, RVT 04/23/2021, 1:28 PM

## 2021-04-23 NOTE — Progress Notes (Addendum)
Pharmacy Antibiotic Note  Scott Garrett is a 75 y.o. male admitted on 04/10/2021 with  wound infection .  Pharmacy has been consulted for vancomycin dosing.  Patient admitted for critical lower limb ischemia. WBC elevated <140K due to lymphoproliferative disorder. Seropurulent drainage from R groin reported overnight. Blood cultures ordered and pharmacy consulted to dose empiric vancomycin. Patient also receiving ceftriaxone. Monitor renal function and signs/symptoms of infection. Will check levels as indicated and follow duration of treatment and culture results. Currently afebrile, however temperature of 100.4 overnight.  Plan: Give vancomycin load of 1500 mg, followed by 1g every 12 hours for predicted AUC/MIC of 478 using Scr of 0.87. Monitor renal function and cultures  Height: 5\' 11"  (180.3 cm) Weight: 82.8 kg (182 lb 8.7 oz) IBW/kg (Calculated) : 75.3  Temp (24hrs), Avg:99.8 F (37.7 C), Min:98.6 F (37 C), Max:100.4 F (38 C)  Recent Labs  Lab 04/18/21 0221 04/19/21 0517 04/20/21 0128 04/20/21 0909 04/21/21 0128 04/21/21 0822 04/22/21 0050 04/23/21 0413  WBC 140.1* 130.0* 128.8*  --   --   --  125.4* 144.7*  CREATININE  --  0.65 0.94  --  0.87  --   --   --   LATICACIDVEN  --   --   --  2.5*  --  1.3  --   --     Estimated Creatinine Clearance: 79.3 mL/min (by C-G formula based on SCr of 0.87 mg/dL).    No Known Allergies  Antimicrobials this admission: Ceftriaxone 10/30 >>  Vancomycin 10/30 >>   Dose adjustments this admission: none  Microbiology results: 10/30 BCx: pending 10/17 Bcx: NG 10/20 MRSA PCR: neg  Thank you for allowing pharmacy to participate in this patient's care.  Reatha Harps, PharmD PGY1 Pharmacy Resident 04/23/2021 9:29 AM Check [email or web address] for unit specific pharmacy number

## 2021-04-23 NOTE — Progress Notes (Signed)
Pt moved to air mattress bed for comfort and improved wound healing. Pt tolerated well. Call light in reach.  Clyde Canterbury, RN

## 2021-04-23 NOTE — Progress Notes (Addendum)
  Progress Note    04/23/2021 8:31 AM 9 Days Post-Op  Subjective:  no complaints. RN notified that there was a copious amount of drainage from right groin staple line last night. Have been changing the dressings as needed when saturated   Vitals:   04/23/21 0326 04/23/21 0807  BP: 134/63 113/70  Pulse: 70 77  Resp: 18 10  Temp: 99.5 F (37.5 C) 98.6 F (37 C)  SpO2: 97% 99%   Physical Exam: Cardiac:  regular Lungs:  non labored Incisions:  Right groin staple line intact and well appearing, Serosanguinous drainage on compression of distal staple line. Right AKA staple line well appearing with viable flaps. No hematoma. No erythema or drainage.  Neurologic: alert and oriented  CBC    Component Value Date/Time   WBC 144.7 (HH) 04/23/2021 0413   RBC 2.51 (L) 04/23/2021 0413   HGB 7.6 (L) 04/23/2021 0413   HCT 24.2 (L) 04/23/2021 0413   PLT 192 04/23/2021 0413   MCV 96.4 04/23/2021 0413   MCH 30.3 04/23/2021 0413   MCHC 31.4 04/23/2021 0413   RDW 15.6 (H) 04/23/2021 0413   LYMPHSABS 112.4 (H) 04/10/2021 2130   MONOABS 4.0 (H) 04/10/2021 2130   EOSABS 0.0 04/10/2021 2130   BASOSABS 0.0 04/10/2021 2130    BMET    Component Value Date/Time   NA 135 04/21/2021 0128   K 4.3 04/21/2021 0128   CL 105 04/21/2021 0128   CO2 25 04/21/2021 0128   GLUCOSE 205 (H) 04/21/2021 0128   BUN 16 04/21/2021 0128   CREATININE 0.87 04/21/2021 0128   CALCIUM 8.0 (L) 04/21/2021 0128   GFRNONAA >60 04/21/2021 0128    INR No results found for: INR   Intake/Output Summary (Last 24 hours) at 04/23/2021 0831 Last data filed at 04/23/2021 0500 Gross per 24 hour  Intake 720 ml  Output 1350 ml  Net -630 ml     Assessment/Plan:  75 y.o. male is s/p right AKA 9 Days Post-Op   Right AKA looks great Right groin incision with serosanguinous drainage increased overnight Does not appear overtly infected but prior Fem-BK popliteal PTFE bypass so will need to closely monitor R  groin Right groin BID dressing changes at minimum, change more frequently if saturated WBC remains elevated 144 Will continue to monitor  DVT prophylaxis:  sq heparin   Karoline Caldwell, PA-C [facility] [phone] 04/23/2021 8:31 AM  VASCULAR STAFF ADDENDUM: I have independently interviewed and examined the patient. I agree with the above.  Seropurulent drainage from R groin overnight.  Incision clean / dry with serosanguinous this morning.  Will check R groin duplex to evaluate for fluid collection. Check blood cultures. Start empiric antibiotics.  Yevonne Aline. Stanford Breed, MD [facility] Phone Number: [phone] 04/23/2021 8:44 AM

## 2021-04-23 NOTE — Progress Notes (Signed)
PROGRESS NOTE    Scott Garrett  DGU:[id number] DOB: Jun 25, 1946 DOA: 04/10/2021 PCP: [name]     Brief Narrative:  This 75 y.o. male with medical history significant of dementia, DM2, HTN, presents from home (lives alone and has a caregiver who comes in 3 times a week) due to increase confusion and right foot pain.  Work-up revealed right lower extremity critical limb ischemia.  Also incidental findings of leukocytosis with WBC greater than 140 K.  Seen by vascular surgery, was taken to the OR emergently on 04/10/2021 by vascular surgery for Right lower extremity revascularization with fasciotomies.  Also seen by medical oncology for WBC>140K.  Flow cytometry done on 04/11/2021 showed atypical T-cell population which compromises 90% of all lymphocytes.  Consistent with a T-cell lymphoproliferative process.  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  UA positive for pyuria on presentation, started on Rocephin on 04/11/2021.  Patient lost Doppler signals to right foot and right calf muscles despite being on heparin drip.  He was taken back to the OR on 04/14/2021 by vascular surgery [name].  Now status post right AKA.  Hospital course complicated by acute blood loss anemia for which she was transfused total 2 units PRBCs for hemoglobin of 7.3 K on 04/12/2021 and 04/13/2021.  Mild serosanguineous drainage on the bandage at the site of amputation.  Vascular surgery recommended to continue to monitor this site given presence of PTFE graft.  Assessment & Plan:   Principal Problem:   Critical limb ischemia of right lower extremity (HCC) Active Problems:   Dementia (HCC)   DM2 (diabetes mellitus, type 2) (HCC)   HTN (hypertension)   Leukocytosis   Pressure injury of skin   Right LE critical limb ischemia /s/p revascularization. Lost doppler signals  despite being on heparin. S/p right AKA: Mild serosanguineous drainage on the  bandage at the site of amputation.   Vascular surgery recommended to  monitor this site given presence of PTFE graft. He completed 7-day course of Rocephin. Medical oncology recommended to transfuse If hemoglobin less than 7.5. Hemoglobin 7.7> 8.0,  closely monitor H&H and transfuse as indicated. Patient has developed serosanguineous discharge from the groin wound. Obtain groin duplex to rule out collection. Patient is started on empiric IV antibiotics ( vancomycin and ceftriaxone )   Acute blood loss anemia: He has received total of 2 units PRBCs transfusion during this admission. Heparin drip was discontinued and he was started on SQ heparin 3 times daily on 04/17/2021 Hemoglobin remains stable. Hb 7.6 today.   Newly diagnosed B-cell lymphoproliferative disorder: Confirmed on lymph node biopsy and flow cytometry done on 04/11/2021 Oncology states there is no indication for treatment at present.  Acute kidney injury: > Resolved. Resolved, suspect multifactorial. Encourage oral hydration.   Hypomagnesemia : Replaced and improved.   Acute metabolic encephalopathy, delirium in the setting of acute illness>: Improved. Continue fall precautions Continue to reorient as needed.   Type 2 diabetes with hyperglycemia: Continue Semglee 10 units daily, Regular insulin sliding scale. Hemoglobin A1c 11.4.    Lactic acidosis : . Resolved. Lactic acid 2.8 >2.4>1.5    Dementia with behavioral disturbances. Continue to reorient as needed.   DVT prophylaxis: Heparin sq Code Status: Full code. Family Communication: No family at bed side. Disposition Plan:   Status is: Inpatient  Remains inpatient appropriate because: Critical limb ischemia underwent right AKA  Anticipated  discharge to skilled nursing facility in 1-2 days   Consultants:  Vascular surgery  Procedures:  Revascularization, fasciotomies on 04/10/2021 by vascular surgery [name]. Right above-the-knee amputation on  04/14/2021.    Antimicrobials:   Anti-infectives (From admission, onward)    Start     Dose/Rate Route Frequency Ordered Stop   04/23/21 2230  vancomycin (VANCOCIN) IVPB 1000 mg/200 mL premix       See Hyperspace for full Linked Orders Report.   1,000 mg 200 mL/hr over 60 Minutes Intravenous Every 12 hours 04/23/21 0930     04/23/21 1015  vancomycin (VANCOREADY) IVPB 1500 mg/300 mL       See Hyperspace for full Linked Orders Report.   1,500 mg 150 mL/hr over 120 Minutes Intravenous  Once 04/23/21 0930     04/23/21 1000  cefTRIAXone (ROCEPHIN) 2 g in sodium chloride 0.9 % 100 mL IVPB        2 g 200 mL/hr over 30 Minutes Intravenous Every 24 hours 04/23/21 0852     04/11/21 0845  cefTRIAXone (ROCEPHIN) 1 g in sodium chloride 0.9 % 100 mL IVPB  Status:  Discontinued        1 g 200 mL/hr over 30 Minutes Intravenous Every 24 hours 04/11/21 0746 04/11/21 0747   04/11/21 0845  cefTRIAXone (ROCEPHIN) 2 g in sodium chloride 0.9 % 100 mL IVPB  Status:  Discontinued        2 g 200 mL/hr over 30 Minutes Intravenous Every 24 hours 04/11/21 0747 04/16/21 1413   04/10/21 2030  piperacillin-tazobactam (ZOSYN) IVPB 3.375 g        3.375 g 100 mL/hr over 30 Minutes Intravenous  Once 04/10/21 2015 04/10/21 2220        Subjective: Patient was seen and examined at bedside.  Overnight events noted.   Patient reports feeling better. There is serosanguineous discharge noted from the groin wound.staples noted but there is no erythema.   He is s/p right AKA.  Objective: Vitals:   04/22/21 2347 04/23/21 0326 04/23/21 0807 04/23/21 1115  BP: 116/61 134/63 113/70 (!) 90/45  Pulse: 72 70 77 61  Resp: 16 18 10 19   Temp: 99.6 F (37.6 C) 99.5 F (37.5 C) 98.6 F (37 C) 98.8 F (37.1 C)  TempSrc: Oral Oral Oral Oral  SpO2: 96% 97% 99% 100%  Weight:      Height:        Intake/Output Summary (Last 24 hours) at 04/23/2021 1157 Last data filed at 04/23/2021 0500 Gross per 24 hour  Intake 720 ml   Output 1350 ml  Net -630 ml   Filed Weights   04/17/21 0318 04/19/21 0316 04/20/21 0336  Weight: 86.4 kg 85.2 kg 82.8 kg    Examination:  General exam: Appears comfortable, not in any acute distress, deconditioned. Appears pleasant. Respiratory system: Clear to auscultation. Respiratory effort normal. RR 14. Cardiovascular system: S1-S2 heard, regular rate and rhythm, no murmur. Gastrointestinal system: Abdomen is soft, nontender, nondistended, BS+ Central nervous system: Alert and oriented X 3. No focal neurological deficits. Extremities: Right AKA, staples noted at the site,  no signs of erythema. Pinkish discharge from groin wound Skin: No rashes, lesions or ulcers Psychiatry: Judgement and insight appear normal. Mood & affect appropriate.     Data Reviewed: I have personally reviewed following labs and imaging studies  CBC: Recent Labs  Lab 04/18/21 0221 04/19/21 0517 04/20/21 0128 04/22/21 0050 04/23/21 0413  WBC 140.1* 130.0* 128.8* 125.4* 144.7*  HGB 8.0* 7.5* 7.5* 7.5* 7.6*  HCT 24.8* 23.0* 24.2* 23.6* 24.2*  MCV 92.9 91.6 94.9 95.5 96.4  PLT 170 178 200 199 280   Basic Metabolic Panel: Recent Labs  Lab 04/19/21 0517 04/20/21 0128 04/21/21 0128  NA 135 133* 135  K 4.1 4.4 4.3  CL 107 104 105  CO2 24 25 25   GLUCOSE 89 307* 205*  BUN 13 17 16   CREATININE 0.65 0.94 0.87  CALCIUM 8.0* 8.0* 8.0*  MG 1.4* 1.7  --   PHOS 2.1* 2.5  --    GFR: Estimated Creatinine Clearance: 79.3 mL/min (by C-G formula based on SCr of 0.87 mg/dL). Liver Function Tests: No results for input(s): AST, ALT, ALKPHOS, BILITOT, PROT, ALBUMIN in the last 168 hours. No results for input(s): LIPASE, AMYLASE in the last 168 hours. No results for input(s): AMMONIA in the last 168 hours. Coagulation Profile: No results for input(s): INR, PROTIME in the last 168 hours. Cardiac Enzymes: No results for input(s): CKTOTAL, CKMB, CKMBINDEX, TROPONINI in the last 168 hours. BNP (last 3  results) No results for input(s): PROBNP in the last 8760 hours. HbA1C: No results for input(s): HGBA1C in the last 72 hours. CBG: Recent Labs  Lab 04/22/21 2111 04/23/21 0612 04/23/21 0619 04/23/21 0626 04/23/21 1119  GLUCAP 114* 169* 171* 160* 197*   Lipid Profile: No results for input(s): CHOL, HDL, LDLCALC, TRIG, CHOLHDL, LDLDIRECT in the last 72 hours. Thyroid Function Tests: No results for input(s): TSH, T4TOTAL, FREET4, T3FREE, THYROIDAB in the last 72 hours. Anemia Panel: No results for input(s): VITAMINB12, FOLATE, FERRITIN, TIBC, IRON, RETICCTPCT in the last 72 hours. Sepsis Labs: Recent Labs  Lab 04/20/21 0909 04/21/21 0349  LATICACIDVEN 2.5* 1.3    Recent Results (from the past 240 hour(s))  MRSA Next Gen by PCR, Nasal     Status: None   Collection Time: 04/13/21 12:05 PM   Specimen: Nasal Mucosa; Nasal Swab  Result Value Ref Range Status   MRSA by PCR Next Gen NOT DETECTED NOT DETECTED Final    Comment: (NOTE) The GeneXpert MRSA Assay (FDA approved for NASAL specimens only), is one component of a comprehensive MRSA colonization surveillance program. It is not intended to diagnose MRSA infection nor to guide or monitor treatment for MRSA infections. Test performance is not FDA approved in patients less than 29 years old. Performed at [facility], [place] [address] [place], [place] [address]      Radiology Studies: No results found.  Scheduled Meds:  Chlorhexidine Gluconate Cloth  6 each Topical Daily   heparin  5,000 Units Subcutaneous Q8H    HYDROmorphone (DILAUDID) injection  1 mg Intravenous Once   insulin aspart  0-15 Units Subcutaneous TID WC   insulin glargine-yfgn  10 Units Subcutaneous Daily   nicotine  14 mg Transdermal Daily   Continuous Infusions:  cefTRIAXone (ROCEPHIN)  IV     vancomycin 1,500 mg (04/23/21 1025)   Followed by   vancomycin       LOS: 13 days    Time spent:  25 mins    Deshae Dickison, MD [facility]   If 7PM-7AM, please contact night-coverage

## 2021-04-24 ENCOUNTER — Other Ambulatory Visit: Payer: Self-pay | Admitting: *Deleted

## 2021-04-24 DIAGNOSIS — D72829 Elevated white blood cell count, unspecified: Secondary | ICD-10-CM

## 2021-04-24 DIAGNOSIS — I70221 Atherosclerosis of native arteries of extremities with rest pain, right leg: Secondary | ICD-10-CM | POA: Diagnosis not present

## 2021-04-24 LAB — BASIC METABOLIC PANEL
Anion gap: 5 (ref 5–15)
BUN: 22 mg/dL (ref 8–23)
CO2: 24 mmol/L (ref 22–32)
Calcium: 8.3 mg/dL — ABNORMAL LOW (ref 8.9–10.3)
Chloride: 105 mmol/L (ref 98–111)
Creatinine, Ser: 0.92 mg/dL (ref 0.61–1.24)
GFR, Estimated: >60 mL/min (ref >60)
Glucose, Bld: 214 mg/dL — ABNORMAL HIGH (ref 70–99)
Potassium: 4.6 mmol/L (ref 3.5–5.1)
Sodium: 134 mmol/L — ABNORMAL LOW (ref 135–145)

## 2021-04-24 LAB — GLUCOSE, CAPILLARY
Glucose-Capillary: 150 mg/dL — ABNORMAL HIGH (ref 70–99)
Glucose-Capillary: 157 mg/dL — ABNORMAL HIGH (ref 70–99)
Glucose-Capillary: 229 mg/dL — ABNORMAL HIGH (ref 70–99)
Glucose-Capillary: 167 mg/dL — ABNORMAL HIGH (ref 70–99)
Glucose-Capillary: 111 mg/dL — ABNORMAL HIGH (ref 70–99)

## 2021-04-24 LAB — LIPID PANEL
Cholesterol: 105 mg/dL (ref 0–200)
HDL: 36 mg/dL — ABNORMAL LOW (ref >40)
LDL Cholesterol: 57 mg/dL (ref 0–99)
Total CHOL/HDL Ratio: 2.9 RATIO
Triglycerides: 59 mg/dL (ref <150)
VLDL: 12 mg/dL (ref 0–40)

## 2021-04-24 LAB — HEMOGLOBIN AND HEMATOCRIT, BLOOD
HCT: 24.8 % — ABNORMAL LOW (ref 39.0–52.0)
Hemoglobin: 7.6 g/dL — ABNORMAL LOW (ref 13.0–17.0)

## 2021-04-24 NOTE — Progress Notes (Signed)
Pt pulled IV and condom cath out. He refused to let staff put condom cath back in. Will monitor.  Kennyth Lose, RN

## 2021-04-24 NOTE — Progress Notes (Signed)
Per Dr. Benay Spice: Schedule for lab/OV in ~ 2 weeks. Scheduling message sent. Order for CBC/diff placed.

## 2021-04-24 NOTE — Progress Notes (Signed)
Physical Therapy Treatment Patient Details Name: Scott Garrett MRN: [id number] DOB: [birth year] Today's Date: 04/24/2021   History of Present Illness Pt is a 75 y.o. male admitted 04/10/21 with confusion, right foot pain.  Work-up for RLE critical limb ischemia. 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 PMH includes dementia, DM2, HTN.    PT Comments    Pt continues to have significant mobility deficits s/p AKA. Pt making slow, steady progress. Continue to recommend SNF.    Recommendations for follow up therapy are one component of a multi-disciplinary discharge planning process, led by the attending physician.  Recommendations may be updated based on patient status, additional functional criteria and insurance authorization.  Follow Up Recommendations  Skilled nursing-short term rehab (<3 hours/day)     Assistance Recommended at Discharge Frequent or constant Supervision/Assistance  Equipment Recommendations  Wheelchair (measurements PT);Wheelchair cushion (measurements PT);BSC    Recommendations for Other Services       Precautions / Restrictions Precautions Precautions: Fall     Mobility  Bed Mobility Overal bed mobility: Needs Assistance Bed Mobility: Rolling;Sidelying to Sit;Sit to Sidelying Rolling: Min assist Sidelying to sit: Mod assist;HOB elevated     Sit to sidelying: Mod assist General bed mobility comments: Assist to bring leg off of bed, elevate trunk into sitting and bring hips to EOB.    Transfers Overall transfer level: Needs assistance Equipment used: Ambulation equipment used Transfers: Sit to/from Stand Sit to Stand: Max assist;+2 physical assistance;From elevated surface           General transfer comment: Heavy assist to bring hips up using bed pad. Pt unable to fully extend hips. Transfer via Lift Equipment: Stedy  Ambulation/Gait                 Stairs             Wheelchair Mobility     Modified Rankin (Stroke Patients Only)       Balance Overall balance assessment: Needs assistance Sitting-balance support: No upper extremity supported;Feet supported Sitting balance-Leahy Scale: Fair     Standing balance support: Bilateral upper extremity supported Standing balance-Leahy Scale: Zero                              Cognition Arousal/Alertness: Awake/alert Behavior During Therapy: WFL for tasks assessed/performed;Agitated Overall Cognitive Status: History of cognitive impairments - at baseline                                 General Comments: Pt perseverating on wanting to eat (lunch not yet present).        Exercises      General Comments        Pertinent Vitals/Pain Pain Assessment: Faces Faces Pain Scale: Hurts little more Pain Location: rt residual limb to touch or mobility Pain Descriptors / Indicators: Grimacing;Guarding Pain Intervention(s): Limited activity within patient's tolerance;Repositioned;Monitored during session    Home Living                          Prior Function            PT Goals (current goals can now be found in the care plan section) Progress towards PT goals: Progressing toward goals    Frequency    Min 2X/week      PT Plan Current plan remains  appropriate    Co-evaluation              AM-PAC PT "6 Clicks" Mobility   Outcome Measure  Help needed turning from your back to your side while in a flat bed without using bedrails?: A Little Help needed moving from lying on your back to sitting on the side of a flat bed without using bedrails?: A Lot Help needed moving to and from a bed to a chair (including a wheelchair)?: Total Help needed standing up from a chair using your arms (e.g., wheelchair or bedside chair)?: Total Help needed to walk in hospital room?: Total Help needed climbing 3-5 steps with a railing? : Total 6 Click Score: 9    End of Session Equipment  Utilized During Treatment: Gait belt Activity Tolerance: Patient tolerated treatment well Patient left: in bed;with call bell/phone within reach;with bed alarm set Nurse Communication: Mobility status PT Visit Diagnosis: Pain;Muscle weakness (generalized) (M62.81);Difficulty in walking, not elsewhere classified (R26.2) Pain - Right/Left: Right Pain - part of body: Leg     Time: 1204-1225 PT Time Calculation (min) (ACUTE ONLY): 21 min  Charges:  $Therapeutic Activity: 8-22 mins                     [place] Pager [phone] Office [place] 04/24/2021, 2:18 PM

## 2021-04-24 NOTE — Progress Notes (Addendum)
  Progress Note    04/24/2021 7:28 AM 10 Days Post-Op  Subjective:  somewhat confused    Vitals:   04/24/21 0000 04/24/21 0443  BP: (!) 112/47 (!) 149/76  Pulse: 66 65  Resp: 19 16  Temp: 98.4 F (36.9 C) 98.4 F (36.9 C)  SpO2: 100% 100%   Physical Exam: Cardiac:  regular Lungs:  non labored Incisions:  right groin incision with staples intact. Fullness present along incision and right proximal thigh. Serosanguinous drainage on compression Extremities:  Right AKA looks great, healing nicely Abdomen:  obese, soft, non distended Neurologic: alert  CBC    Component Value Date/Time   WBC 144.7 (HH) 04/23/2021 0413   RBC 2.51 (L) 04/23/2021 0413   HGB 7.6 (L) 04/24/2021 0054   HCT 24.8 (L) 04/24/2021 0054   PLT 192 04/23/2021 0413   MCV 96.4 04/23/2021 0413   MCH 30.3 04/23/2021 0413   MCHC 31.4 04/23/2021 0413   RDW 15.6 (H) 04/23/2021 0413   LYMPHSABS 112.4 (H) 04/10/2021 2130   MONOABS 4.0 (H) 04/10/2021 2130   EOSABS 0.0 04/10/2021 2130   BASOSABS 0.0 04/10/2021 2130    BMET    Component Value Date/Time   NA 134 (L) 04/24/2021 0054   K 4.6 04/24/2021 0054   CL 105 04/24/2021 0054   CO2 24 04/24/2021 0054   GLUCOSE 214 (H) 04/24/2021 0054   BUN 22 04/24/2021 0054   CREATININE 0.92 04/24/2021 0054   CALCIUM 8.3 (L) 04/24/2021 0054   GFRNONAA >60 04/24/2021 0054    INR No results found for: INR   Intake/Output Summary (Last 24 hours) at 04/24/2021 9179 Last data filed at 04/24/2021 0600 Gross per 24 hour  Intake 790 ml  Output 1100 ml  Net -310 ml     Assessment/Plan:  75 y.o. male is s/p Right AKA 10 Days Post-Op   Right AKA well appearing Right groin incision with fullness and serosanguinous drainage on compression Continue dressing changes BID Duplex shows fluid collection in right groin with mixed echogenicities Afebrile Given Rocephin and on Vancomycin Blood cultures pending  DVT prophylaxis:  sq heparin    Karoline Caldwell,  PA-C [facility] [phone] 04/24/2021 7:28 AM  Addendum: Diamantina Monks prevena VAC applied to right groin. Good suction achieved  I have independently interviewed and patient and agree with PA assessment and plan above. Prevena placed today. Will continue to monitor groin drainage.   Kadelyn Dimascio C. Donzetta Matters, MD [facility] [place] Office: [phone] Pager: [phone]

## 2021-04-24 NOTE — Progress Notes (Addendum)
Pt was found sitting on the floor with no witness of fall. Pt stated " I feel so stupid. I just want to transfer to the chair and I try to slide myself down to the floor. I don't hit my head and I have no pian or any aching. I am okay".  Pt is alert and oriented x 4, answering all questions appropriately, but impulsively pulling EKG monitor out. Pt stated "all these wires and monitor are really annoying". 43 male staff helped to carry him back to his bed. Floor mat and bed alarm on.   He is hemodynamically stable, afebrile, no distress noted. Skin assessment was done, we did not find any injuries, no skin tear, no bleeding, hematoma or fracture.  Dr. Alcario Drought was notified. No new order at this time. Pt's daughter, Ms.Yetta Barre was notified. She requested to speak with Pt by using staff's phone. She agreed that we should put the tele-sitter in his room due to the impulsive behavior.  Tele- sitter was called. Equipment is pending to the room. We will follow the post- fall protocol in the safety-zone. Continue to monitor.  Kennyth Lose, RN

## 2021-04-24 NOTE — Progress Notes (Signed)
PROGRESS NOTE    Scott Garrett  IFO:[id number] DOB: [birth year] DOA: 04/10/2021 PCP: [name]     Brief Narrative:  This 75 y.o. male with medical history significant of dementia, DM2, HTN, presents from home (lives alone and has a caregiver who comes in 3 times a week) due to increase confusion and right foot pain.  Work-up revealed right lower extremity critical limb ischemia.  Also incidental findings of leukocytosis with WBC greater than 140 K.  Seen by vascular surgery, was taken to the OR emergently on 04/10/2021 by vascular surgery for Right lower extremity revascularization with fasciotomies.  Also seen by medical oncology for WBC>140K.  Flow cytometry done on 04/11/2021 showed atypical T-cell population which compromises 90% of all lymphocytes.  Consistent with a T-cell lymphoproliferative process.  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  UA positive for pyuria on presentation, started on Rocephin on 04/11/2021.  Patient lost Doppler signals to right foot and right calf muscles despite being on heparin drip.  He was taken back to the OR on 04/14/2021 by vascular surgery [name].  Now status post right AKA.  Hospital course complicated by acute blood loss anemia for which she was transfused total 2 units PRBCs for hemoglobin of 7.3 K on 04/12/2021 and 04/13/2021.  Mild serosanguineous drainage on the bandage at the site of amputation.  Vascular surgery recommended to continue to monitor this site given presence of PTFE graft.  Assessment & Plan:   Principal Problem:   Critical limb ischemia of right lower extremity (HCC) Active Problems:   Dementia (HCC)   DM2 (diabetes mellitus, type 2) (HCC)   HTN (hypertension)   Leukocytosis   Pressure injury of skin   Right LE critical limb ischemia /s/p revascularization. Lost doppler signals  despite being on heparin. S/p right AKA: Mild serosanguineous drainage noted  on the bandage at the site of amputation.   Vascular surgery recommended to monitor this site given presence of PTFE graft. Patient initially completed 7-day course of Rocephin. Medical oncology recommended to transfuse If hemoglobin less than 7.5. Hemoglobin 7.7> 8.0,  closely monitor H&H and transfuse as indicated. Patient has developed serosanguineous discharge from the groin wound. Duplex showed fluid collection in the right groin with mixed echogenicity Patient is started on empiric IV antibiotics ( vancomycin and ceftriaxone ) Follow blood cultures. Small prevena wound VAC applied to the right groin.   Acute blood loss anemia: He has received total of 2 units PRBCs transfusion during this admission. Heparin drip was discontinued and he was started on SQ heparin 3 times daily on 04/17/2021 Hemoglobin remains stable. Hb 7.6 today.   Newly diagnosed B-cell lymphoproliferative disorder: Confirmed on lymph node biopsy and flow cytometry done on 04/11/2021 Oncology states there is Garrett indication for treatment at present.  Acute kidney injury: > Resolved. Resolved, suspect multifactorial. Encourage oral hydration.   Hypomagnesemia : Replaced and improved.   Acute metabolic encephalopathy, delirium in the setting of acute illness>: Improved. Continue fall precautions Continue to reorient as needed.   Type 2 diabetes with hyperglycemia: Continue Semglee 10 units daily, Regular insulin sliding scale. Hemoglobin A1c 11.4.    Lactic acidosis : . Resolved. Lactic acid 2.8 >2.4>1.5    Dementia with behavioral disturbances. Continue to reorient as needed.   DVT prophylaxis: Heparin sq Code Status: Full code. Family Communication: Garrett family at bed side. Disposition Plan:   Status is: Inpatient  Remains inpatient appropriate because: Critical limb ischemia underwent  right AKA  Anticipated  discharge to skilled nursing facility in 1-2 days   Consultants:  Vascular  surgery  Procedures:  Revascularization, fasciotomies on 04/10/2021 by vascular surgery [name]. 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    Antimicrobials:   Anti-infectives (From admission, onward)    Start     Dose/Rate Route Frequency Ordered Stop   04/23/21 2230  vancomycin (VANCOCIN) IVPB 1000 mg/200 mL premix       See Hyperspace for full Linked Orders Report.   1,000 mg 200 mL/hr over 60 Minutes Intravenous Every 12 hours 04/23/21 0930     04/23/21 1015  vancomycin (VANCOREADY) IVPB 1500 mg/300 mL       See Hyperspace for full Linked Orders Report.   1,500 mg 150 mL/hr over 120 Minutes Intravenous  Once 04/23/21 0930 04/23/21 1225   04/23/21 1000  cefTRIAXone (ROCEPHIN) 2 g in sodium chloride 0.9 % 100 mL IVPB        2 g 200 mL/hr over 30 Minutes Intravenous Every 24 hours 04/23/21 0852     04/11/21 0845  cefTRIAXone (ROCEPHIN) 1 g in sodium chloride 0.9 % 100 mL IVPB  Status:  Discontinued        1 g 200 mL/hr over 30 Minutes Intravenous Every 24 hours 04/11/21 0746 04/11/21 0747   04/11/21 0845  cefTRIAXone (ROCEPHIN) 2 g in sodium chloride 0.9 % 100 mL IVPB  Status:  Discontinued        2 g 200 mL/hr over 30 Minutes Intravenous Every 24 hours 04/11/21 0747 04/16/21 1413   04/10/21 2030  piperacillin-tazobactam (ZOSYN) IVPB 3.375 g        3.375 g 100 mL/hr over 30 Minutes Intravenous  Once 04/10/21 2015 04/10/21 2220        Subjective: Patient was seen and examined at bedside.  Overnight events noted.   Overnight patient fell on his butt while getting out of bed to the chair.  Denies any head injury or LOC He is s/p right AKA.  He is doing better overall.  Objective: Vitals:   04/24/21 0000 04/24/21 0443 04/24/21 0949 04/24/21 1311  BP: (!) 112/47 (!) 149/76 109/61 127/62  Pulse: 66 65 80 69  Resp: 19 16 18 18   Temp: 98.4 F (36.9 C) 98.4 F (36.9 C) 98.8 F (37.1 C) 97.6 F (36.4 C)  TempSrc: Oral Oral Oral Oral  SpO2: 100% 100% 100% 93%   Weight:      Height:        Intake/Output Summary (Last 24 hours) at 04/24/2021 1410 Last data filed at 04/24/2021 0600 Gross per 24 hour  Intake 790 ml  Output 1100 ml  Net -310 ml   Filed Weights   04/17/21 0318 04/19/21 0316 04/20/21 0336  Weight: 86.4 kg 85.2 kg 82.8 kg    Examination:  General exam: Appears comfortable, not in any acute distress, deconditioned.  Respiratory system: Clear to auscultation. Respiratory effort normal. RR 16. Cardiovascular system: S1-S2 heard, regular rate and rhythm, Garrett murmur. Gastrointestinal system: Abdomen is soft, nontender, nondistended, BS+ Central nervous system: Alert and oriented X 3. Garrett focal neurological deficits. Extremities: Right AKA, staples noted at the site,  Garrett signs of erythema.   Wound VAC attached to the groin wound.  Skin: Garrett rashes, lesions or ulcers Psychiatry: Judgement and insight appear normal. Mood & affect appropriate.     Data Reviewed: I have personally reviewed following labs and imaging studies  CBC: Recent Labs  Lab 04/18/21 0221 04/19/21 0517  04/20/21 0128 04/22/21 0050 04/23/21 0413 04/24/21 0054  WBC 140.1* 130.0* 128.8* 125.4* 144.7*  --   HGB 8.0* 7.5* 7.5* 7.5* 7.6* 7.6*  HCT 24.8* 23.0* 24.2* 23.6* 24.2* 24.8*  MCV 92.9 91.6 94.9 95.5 96.4  --   PLT 170 178 200 199 192  --    Basic Metabolic Panel: Recent Labs  Lab 04/19/21 0517 04/20/21 0128 04/21/21 0128 04/23/21 0413 04/24/21 0054  NA 135 133* 135 136 134*  K 4.1 4.4 4.3 4.6 4.6  CL 107 104 105 105 105  CO2 24 25 25 26 24   GLUCOSE 89 307* 205* 158* 214*  BUN 13 17 16 18 22   CREATININE 0.65 0.94 0.87 0.83 0.92  CALCIUM 8.0* 8.0* 8.0* 8.4* 8.3*  MG 1.4* 1.7  --   --   --   PHOS 2.1* 2.5  --   --   --    GFR: Estimated Creatinine Clearance: 75 mL/min (by C-G formula based on SCr of 0.92 mg/dL). Liver Function Tests: Garrett results for input(s): AST, ALT, ALKPHOS, BILITOT, PROT, ALBUMIN in the last 168 hours. Garrett results for  input(s): LIPASE, AMYLASE in the last 168 hours. Garrett results for input(s): AMMONIA in the last 168 hours. Coagulation Profile: Garrett results for input(s): INR, PROTIME in the last 168 hours. Cardiac Enzymes: Garrett results for input(s): CKTOTAL, CKMB, CKMBINDEX, TROPONINI in the last 168 hours. BNP (last 3 results) Garrett results for input(s): PROBNP in the last 8760 hours. HbA1C: Garrett results for input(s): HGBA1C in the last 72 hours. CBG: Recent Labs  Lab 04/23/21 1615 04/23/21 2156 04/24/21 0619 04/24/21 0659 04/24/21 1259  GLUCAP 294* 129* 150* 157* 229*   Lipid Profile: Recent Labs    04/24/21 0054  CHOL 105  HDL 36*  LDLCALC 57  TRIG 59  CHOLHDL 2.9   Thyroid Function Tests: Garrett results for input(s): TSH, T4TOTAL, FREET4, T3FREE, THYROIDAB in the last 72 hours. Anemia Panel: Garrett results for input(s): VITAMINB12, FOLATE, FERRITIN, TIBC, IRON, RETICCTPCT in the last 72 hours. Sepsis Labs: Recent Labs  Lab 04/20/21 0909 04/21/21 6834  LATICACIDVEN 2.5* 1.3    Recent Results (from the past 240 hour(s))  Culture, blood (Routine X 2) w Reflex to ID Panel     Status: None (Preliminary result)   Collection Time: 04/23/21  1:15 PM   Specimen: BLOOD  Result Value Ref Range Status   Specimen Description BLOOD LEFT ANTECUBITAL  Final   Special Requests AEROBIC BOTTLE ONLY Blood Culture adequate volume  Final   Culture   Final    Garrett GROWTH < 24 HOURS Performed at [facility], [address] [place], [place] [address]    Report Status PENDING  Incomplete  Culture, blood (Routine X 2) w Reflex to ID Panel     Status: None (Preliminary result)   Collection Time: 04/23/21  1:15 PM   Specimen: BLOOD  Result Value Ref Range Status   Specimen Description BLOOD LEFT ANTECUBITAL  Final   Special Requests AEROBIC BOTTLE ONLY Blood Culture adequate volume  Final   Culture   Final    Garrett GROWTH < 24 HOURS Performed at [facility], [place] [address], [place], [place] [address]     Report Status PENDING  Incomplete     Radiology Studies: VAS [place] LOWER EXTREMITY ARTERIAL DUPLEX  Result Date: 04/23/2021 LOWER EXTREMITY ARTERIAL DUPLEX STUDY Patient Name:  Scott Garrett  Date of Exam:   04/23/2021 Medical Rec #: [id number]  Accession #:    [id number] Date of Birth: December 18, 1945       Patient Gender: M Patient Age:   52 years Exam Location:  [facility] Procedure:      VAS [place] LOWER EXTREMITY ARTERIAL DUPLEX Referring Phys: Jamelle Haring --------------------------------------------------------------------------------  Indications: Right AKA and Check for fluid collection/infection. High Risk Factors: Hypertension, Diabetes, past history of smoking. Other Factors: Dementia.  Current ABI: n/a Limitations: Staples, tissue properties Comparison Study: Garrett prior study Performing Technologist: Sharion Dove RVS  Examination Guidelines: A complete evaluation includes B-mode imaging, spectral Doppler, color Doppler, and power Doppler as needed of all accessible portions of each vessel. Bilateral testing is considered an integral part of a complete examination. Limited examinations for reoccurring indications may be performed as noted.   Summary: Right: Cystic area with mixed echogenicites noted in the right groin. Known DVT observed during attempted thrombectomy 10/17, still present in the right femoral vein.     Preliminary     Scheduled Meds:  Chlorhexidine Gluconate Cloth  6 each Topical Daily   heparin  5,000 Units Subcutaneous Q8H    HYDROmorphone (DILAUDID) injection  1 mg Intravenous Once   insulin aspart  0-15 Units Subcutaneous TID WC   insulin glargine-yfgn  10 Units Subcutaneous Daily   nicotine  14 mg Transdermal Daily   Continuous Infusions:  cefTRIAXone (ROCEPHIN)  IV 2 g (04/24/21 0934)   vancomycin 1,000 mg (04/24/21 1126)     LOS: 14 days    Time spent:  25 mins    Kamilo Och, MD [facility]   If 7PM-7AM, please contact night-coverage

## 2021-04-24 NOTE — TOC Progression Note (Signed)
Transition of Care [facility]) - Progression Note    Patient Details  Name: Scott Garrett MRN: [id number] Date of Birth: 08/11/1945  Transition of Care [facility]) CM/SW [place], [place] Phone Number: 04/24/2021, 1:06 PM  Clinical Narrative:     Patient's daughter, [name],- she requested CSW send clinicals to [facility] for SNF approval. CSW faxed information as requested.  CSW spoke with [name] - she advised patient is not eligible for LTC benefits.  [name], MSW, LCSW Clinical Social Worker    Expected Discharge Plan: Skilled Nursing Facility Barriers to Discharge: Awaiting State Approval (PASRR), Insurance Authorization, Continued Medical Work up, SNF Pending bed offer  Expected Discharge Plan and Services Expected Discharge Plan: [place] In-house Referral: Clinical Social Work     Living arrangements for the past 2 months: Apartment                                       Social Determinants of Health (SDOH) Interventions    Readmission Risk Interventions No flowsheet data found.

## 2021-04-25 LAB — BASIC METABOLIC PANEL
Anion gap: 6 (ref 5–15)
BUN: 20 mg/dL (ref 8–23)
CO2: 24 mmol/L (ref 22–32)
Calcium: 8.3 mg/dL — ABNORMAL LOW (ref 8.9–10.3)
Chloride: 107 mmol/L (ref 98–111)
Creatinine, Ser: 0.87 mg/dL (ref 0.61–1.24)
GFR, Estimated: >60 mL/min (ref >60)
Glucose, Bld: 116 mg/dL — ABNORMAL HIGH (ref 70–99)
Potassium: 4.3 mmol/L (ref 3.5–5.1)
Sodium: 137 mmol/L (ref 135–145)

## 2021-04-25 LAB — GLUCOSE, CAPILLARY
Glucose-Capillary: 115 mg/dL — ABNORMAL HIGH (ref 70–99)
Glucose-Capillary: 193 mg/dL — ABNORMAL HIGH (ref 70–99)
Glucose-Capillary: 265 mg/dL — ABNORMAL HIGH (ref 70–99)
Glucose-Capillary: 119 mg/dL — ABNORMAL HIGH (ref 70–99)

## 2021-04-25 LAB — HEMOGLOBIN AND HEMATOCRIT, BLOOD
HCT: 23.3 % — ABNORMAL LOW (ref 39.0–52.0)
HCT: 28.7 % — ABNORMAL LOW (ref 39.0–52.0)
Hemoglobin: 7.2 g/dL — ABNORMAL LOW (ref 13.0–17.0)
Hemoglobin: 9.1 g/dL — ABNORMAL LOW (ref 13.0–17.0)

## 2021-04-25 LAB — PREPARE RBC (CROSSMATCH)

## 2021-04-25 MED ORDER — SODIUM CHLORIDE 0.9% IV SOLUTION: Freq: Once | INTRAVENOUS | Status: DC

## 2021-04-25 NOTE — Progress Notes (Signed)
Mobility Specialist: Progress Note   04/25/21 1502  Mobility  Activity Transferred:  Chair to bed  Level of Assistance +2 (takes two people)  Secondary school teacher Out of bed to chair with meals  Mobility Response Tolerated fair  Mobility performed by Mobility specialist;Nurse tech  $Mobility charge 1 Mobility   Pt assisted back to bed per request. Pt required modA +2 to stand at the stedy and minA to bring his leg back into the bed. Pt has call bell and phone at his side. Family member present in the room.   [facility] Scott Garrett Mobility Specialist Mobility Specialist Phone: [phone]

## 2021-04-25 NOTE — Progress Notes (Addendum)
Occupational Therapy Treatment Patient Details Name: Scott Garrett MRN: [id number] DOB: 1945/11/10 Today's Date: 04/25/2021   History of present illness Pt is a 75 y.o. male admitted 04/10/21 with confusion, right foot pain.  Work-up for RLE critical limb ischemia. 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 PMH includes dementia, DM2, HTN.   OT comments  Pt with gradual progression towards OT goals. With encouragement, pt agreeable to sit up in chair if provided with snacks. Pt overall Mod A x 2 for bed mobility and Mod A x 2 for sit to stand in [place] from elevated bed. Pt able to complete 2 short bouts of standing in [place] for peri care (bowel incontinence) and for transfer to chair. Pt benefits from cues for safe positioning due to excessive trunk flexion in standing. Pt's sister present and assisted in encouraging pt as he endorses difficulty finding the motivation for OOB attempts. Continue to rec SNF rehab.   Recommendations for follow up therapy are one component of a multi-disciplinary discharge planning process, led by the attending physician.  Recommendations may be updated based on patient status, additional functional criteria and insurance authorization.    Follow Up Recommendations  Skilled nursing-short term rehab (<3 hours/day)    Assistance Recommended at Discharge Frequent or constant Supervision/Assistance  Equipment Recommendations  Wheelchair (measurements OT);Wheelchair cushion (measurements OT)    Recommendations for Other Services      Precautions / Restrictions Precautions Precautions: Fall Restrictions Weight Bearing Restrictions: Yes RLE Weight Bearing: Non weight bearing       Mobility Bed Mobility Overal bed mobility: Needs Assistance Bed Mobility: Supine to Sit     Supine to sit: Mod assist;+2 for safety/equipment;+2 for physical assistance;HOB elevated     General bed mobility comments: Able to bring L LE to EOB, +2  assist to lift trunk    Transfers Overall transfer level: Needs assistance Equipment used: Ambulation equipment used Transfers: Sit to/from Stand Sit to Stand: Mod assist;+2 physical assistance;+2 safety/equipment;From elevated surface           General transfer comment: Mod A x 2 for sit to stand from bed, kyphotic posture noted and cues for hand placement and positioning needed. able to stand briefly for hygiene and transfer to chair on 2nd attempt Transfer via Lift Equipment: Stedy   Balance Overall balance assessment: Needs assistance Sitting-balance support: No upper extremity supported;Feet supported Sitting balance-Leahy Scale: Fair     Standing balance support: Bilateral upper extremity supported Standing balance-Leahy Scale: Poor Standing balance comment: use of B UE support on stedy (kyphotic posture) and external support in [place]                           ADL either performed or assessed with clinical judgement   ADL Overall ADL's : Needs assistance/impaired                             Toileting- Clothing Manipulation and Hygiene: Total assistance;Sit to/from stand;+2 for physical assistance;+2 for safety/equipment Toileting - Clothing Manipulation Details (indicate cue type and reason): Total x 2 for peri care after bowel incontinence. One person to assist with balance in [place] and second person to assist with hygiene       General ADL Comments: Focused on progression of OOB activities with improving sit to stand in [place] for transfer though limited by cognition and weakness     Vision  Vision Assessment?: No apparent visual deficits   Perception     Praxis      Cognition Arousal/Alertness: Awake/alert Behavior During Therapy: WFL for tasks assessed/performed;Agitated Overall Cognitive Status: History of cognitive impairments - at baseline                                 General Comments: pt perseverates on desire  for snacks (provided graham crackers), benefits from cues for safety and sequencing. Some continued difficulty accepting education on benefits of OOB activities          Exercises     Shoulder Instructions       General Comments VSS on RA. Sister present and able to assist in motivating pt    Pertinent Vitals/ Pain       Pain Assessment: Faces Faces Pain Scale: Hurts a little bit Pain Location: back Pain Descriptors / Indicators: Grimacing;Guarding Pain Intervention(s): Monitored during session;Limited activity within patient's tolerance  Home Living                                          Prior Functioning/Environment              Frequency  Min 2X/week        Progress Toward Goals  OT Goals(current goals can now be found in the care plan section)  Progress towards OT goals: Progressing toward goals  Acute Rehab OT Goals OT Goal Formulation: With patient Time For Goal Achievement: 04/30/21 Potential to Achieve Goals: Fair ADL Goals Pt Will Perform Lower Body Bathing: with mod assist;sit to/from stand Pt Will Perform Lower Body Dressing: with mod assist;sit to/from stand Pt Will Transfer to Toilet: with mod assist;stand pivot transfer;bedside commode Pt Will Perform Toileting - Clothing Manipulation and hygiene: with mod assist;sitting/lateral leans  Plan Discharge plan remains appropriate    Co-evaluation                 AM-PAC OT "6 Clicks" Daily Activity     Outcome Measure   Help from another person eating meals?: None Help from another person taking care of personal grooming?: A Little Help from another person toileting, which includes using toliet, bedpan, or urinal?: Total Help from another person bathing (including washing, rinsing, drying)?: A Lot Help from another person to put on and taking off regular upper body clothing?: A Little Help from another person to put on and taking off regular lower body clothing?: A  Lot 6 Click Score: 15    End of Session Equipment Utilized During Treatment: Gait belt  OT Visit Diagnosis: Other abnormalities of gait and mobility (R26.89);Muscle weakness (generalized) (M62.81);Pain   Activity Tolerance Patient tolerated treatment well   Patient Left in chair;with call bell/phone within reach;with chair alarm set   Nurse Communication Mobility status;Need for lift equipment        Time: 4196-2229 OT Time Calculation (min): 25 min  Charges: OT General Charges $OT Visit: 1 Visit OT Treatments $Self Care/Home Management : 8-22 mins $Therapeutic Activity: 8-22 mins  Malachy Chamber, OTR/L Acute Rehab Services Office: [phone]   Layla Maw 04/25/2021, 2:03 PM

## 2021-04-25 NOTE — Progress Notes (Signed)
Patient removed prevena on day shift. This RN placed 4x4 gauze with tape to cover incision. Reassess site. Dressing saturated. Replaced dressing.  Marked area where possible hematoma is. Will have MD assess site on rounding.  Will continue to [employer]

## 2021-04-25 NOTE — TOC Progression Note (Addendum)
Transition of Care [facility]) - Progression Note    Patient Details  Name: Scott Garrett MRN: [id number] Date of Birth: 03/27/1946  Transition of Care [facility]) CM/SW [place], [place] Phone Number: 04/25/2021, 2:53 PM  Clinical Narrative:     CSW informed by [facility]- patient is not eligible for SNF benefits. Patient will have to use his medicare benefits.  CSW will continue to follow and assist with discharge planning.  [name], MSW, LCSW Clinical Social Worker    Expected Discharge Plan: Skilled Nursing Facility Barriers to Discharge: Awaiting State Approval (PASRR), Insurance Authorization, Continued Medical Work up, SNF Pending bed offer  Expected Discharge Plan and Services Expected Discharge Plan: [place] In-house Referral: Clinical Social Work     Living arrangements for the past 2 months: Apartment                                       Social Determinants of Health (SDOH) Interventions    Readmission Risk Interventions No flowsheet data found.

## 2021-04-25 NOTE — Progress Notes (Signed)
PROGRESS NOTE    Blade Scheff  JAS:[id number] DOB: 1946/05/02 DOA: 04/10/2021 PCP: [name]     Brief Narrative:  This 75 y.o. male with PMH significant of dementia, DM2, HTN, presents from home (lives alone and has a caregiver who comes in 3 times a week) due to increased confusion and right foot pain.  Work-up revealed right lower extremity critical limb ischemia.  Also incidental findings of leukocytosis with WBC greater than 140 K.  Seen by vascular surgery, was taken to the OR emergently on 04/10/2021 by vascular surgery for Right lower extremity revascularization with fasciotomies.  Also seen by medical oncology for WBC>140K.  Flow cytometry done on 04/11/2021 showed atypical T-cell population which compromises 90% of all lymphocytes.  Consistent with a T-cell lymphoproliferative process.  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  UA positive for pyuria on presentation, started on Rocephin on 04/11/2021.  Patient lost Doppler signals to right foot and right calf muscles despite being on heparin drip.  He was taken back to the OR on 04/14/2021 by vascular surgery [name].  Now status post right AKA.  Hospital course complicated by acute blood loss anemia for which she was transfused total 4 units PRBCs for hemoglobin of 7.3 K on 04/12/2021 and 04/13/2021.  Mild serosanguineous drainage on the bandage at the site of amputation.  Vascular surgery recommended to continue to monitor this site given presence of PTFE graft.  Assessment & Plan:   Principal Problem:   Critical limb ischemia of right lower extremity (HCC) Active Problems:   Dementia (HCC)   DM2 (diabetes mellitus, type 2) (HCC)   HTN (hypertension)   Leukocytosis   Pressure injury of skin   Right LE critical limb ischemia /s/p revascularization. Lost doppler signals  despite being on heparin. S/p right AKA: Mild serosanguineous drainage noted on the  bandage at the site of amputation.   Vascular surgery recommended to monitor this site given presence of PTFE graft. Patient initially completed 7-day course of Rocephin. Medical oncology recommended to transfuse If hemoglobin less than 7.5. Patient has developed serosanguineous discharge from the groin wound. Duplex showed fluid collection in the right groin with mixed echogenicity Patient is started  back on empiric IV antibiotics ( vancomycin and ceftriaxone)on 10/29 Follow blood cultures no growth so far.  Small prevena wound VAC applied to the right groin.   Acute blood loss anemia: He has received total of 4 units PRBCs transfusion during this admission. Heparin drip was discontinued and he was started on SQ heparin 3 times daily on 04/17/2021 Hemoglobin 7.2, ordered 1 unit of PRBC. Continue to monitor H&H.   Newly diagnosed B-cell lymphoproliferative disorder: Confirmed on lymph node biopsy and flow cytometry done on 04/11/2021 Oncology states there is no indication for treatment at present.  Acute kidney injury: > Resolved. Resolved, suspect multifactorial. Encourage oral hydration.   Hypomagnesemia : Replaced and improved.   Acute metabolic encephalopathy, delirium in the setting of acute illness>: Improved. Continue fall precautions Continue to reorient as needed.   Type 2 diabetes with hyperglycemia: Continue Semglee 10 units daily, Regular insulin sliding scale. Hemoglobin A1c 11.4.    Lactic acidosis : . Resolved. Lactic acid 2.8 >2.4>1.5    Dementia with behavioral disturbances. Continue to reorient as needed.   DVT prophylaxis: Heparin sq Code Status: Full code. Family Communication: No family at bed side. Disposition Plan:   Status is: Inpatient  Remains inpatient appropriate because: Critical limb ischemia underwent  right AKA.  Now developed groin abscess requiring IV antibiotics.  Anticipated  discharge to skilled nursing facility in 1-2 days    Consultants:  Vascular surgery  Procedures:  Revascularization, fasciotomies on 04/10/2021 by vascular surgery [name]. 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    Antimicrobials:   Anti-infectives (From admission, onward)    Start     Dose/Rate Route Frequency Ordered Stop   04/23/21 2230  vancomycin (VANCOCIN) IVPB 1000 mg/200 mL premix       See Hyperspace for full Linked Orders Report.   1,000 mg 200 mL/hr over 60 Minutes Intravenous Every 12 hours 04/23/21 0930     04/23/21 1015  vancomycin (VANCOREADY) IVPB 1500 mg/300 mL       See Hyperspace for full Linked Orders Report.   1,500 mg 150 mL/hr over 120 Minutes Intravenous  Once 04/23/21 0930 04/23/21 1225   04/23/21 1000  cefTRIAXone (ROCEPHIN) 2 g in sodium chloride 0.9 % 100 mL IVPB        2 g 200 mL/hr over 30 Minutes Intravenous Every 24 hours 04/23/21 0852     04/11/21 0845  cefTRIAXone (ROCEPHIN) 1 g in sodium chloride 0.9 % 100 mL IVPB  Status:  Discontinued        1 g 200 mL/hr over 30 Minutes Intravenous Every 24 hours 04/11/21 0746 04/11/21 0747   04/11/21 0845  cefTRIAXone (ROCEPHIN) 2 g in sodium chloride 0.9 % 100 mL IVPB  Status:  Discontinued        2 g 200 mL/hr over 30 Minutes Intravenous Every 24 hours 04/11/21 0747 04/16/21 1413   04/10/21 2030  piperacillin-tazobactam (ZOSYN) IVPB 3.375 g        3.375 g 100 mL/hr over 30 Minutes Intravenous  Once 04/10/21 2015 04/10/21 2220        Subjective: Patient was seen and examined at bedside.  Overnight events noted.   Overnight patient was agitated, pulling IV lines, seems sundowning.  He is alert,  awake and oriented following full commands in the morning. He is s/p right AKA.  He is doing better overall.  Objective: Vitals:   04/25/21 1040 04/25/21 1049 04/25/21 1104 04/25/21 1130  BP: 123/65 (!) 112/58 114/65 (!) 112/58  Pulse: 70 81 81 81  Resp: 14 19 15 16   Temp: 98.4 F (36.9 C) 98.1 F (36.7 C) 98.1 F (36.7 C) 98.1 F (36.7 C)   TempSrc: Oral  Oral Oral  SpO2: 98%  98% 99%  Weight:      Height:        Intake/Output Summary (Last 24 hours) at 04/25/2021 1154 Last data filed at 04/25/2021 0520 Gross per 24 hour  Intake 500 ml  Output 1250 ml  Net -750 ml   Filed Weights   04/19/21 0316 04/20/21 0336 04/25/21 0326  Weight: 85.2 kg 82.8 kg 83.8 kg    Examination:  General exam: Appears comfortable, not in any acute distress, deconditioned.  Respiratory system: Clear to auscultation. Respiratory effort normal. RR 15. Cardiovascular system: S1-S2 heard, regular rate and rhythm, no murmur. Gastrointestinal system: Abdomen is soft, nontender, nondistended, BS+ Central nervous system: Alert and oriented X 3. No focal neurological deficits. Extremities: Right AKA, staples noted at the site, Pinkish discharge noted on dressing Wound VAC attached to the groin wound.   Skin: No rashes, lesions or ulcers Psychiatry: Judgement and insight appear normal. Mood & affect appropriate.     Data Reviewed: I have personally reviewed following labs and imaging studies  CBC: Recent Labs  Lab 04/19/21 0517 04/20/21 0128 04/22/21 0050 04/23/21 0413 04/24/21 0054 04/25/21 0135  WBC 130.0* 128.8* 125.4* 144.7*  --   --   HGB 7.5* 7.5* 7.5* 7.6* 7.6* 7.2*  HCT 23.0* 24.2* 23.6* 24.2* 24.8* 23.3*  MCV 91.6 94.9 95.5 96.4  --   --   PLT 178 200 199 192  --   --    Basic Metabolic Panel: Recent Labs  Lab 04/19/21 0517 04/20/21 0128 04/21/21 0128 04/23/21 0413 04/24/21 0054 04/25/21 0135  NA 135 133* 135 136 134* 137  K 4.1 4.4 4.3 4.6 4.6 4.3  CL 107 104 105 105 105 107  CO2 24 25 25 26 24 24   GLUCOSE 89 307* 205* 158* 214* 116*  BUN 13 17 16 18 22 20   CREATININE 0.65 0.94 0.87 0.83 0.92 0.87  CALCIUM 8.0* 8.0* 8.0* 8.4* 8.3* 8.3*  MG 1.4* 1.7  --   --   --   --   PHOS 2.1* 2.5  --   --   --   --    GFR: Estimated Creatinine Clearance: 79.3 mL/min (by C-G formula based on SCr of 0.87 mg/dL). Liver  Function Tests: No results for input(s): AST, ALT, ALKPHOS, BILITOT, PROT, ALBUMIN in the last 168 hours. No results for input(s): LIPASE, AMYLASE in the last 168 hours. No results for input(s): AMMONIA in the last 168 hours. Coagulation Profile: No results for input(s): INR, PROTIME in the last 168 hours. Cardiac Enzymes: No results for input(s): CKTOTAL, CKMB, CKMBINDEX, TROPONINI in the last 168 hours. BNP (last 3 results) No results for input(s): PROBNP in the last 8760 hours. HbA1C: No results for input(s): HGBA1C in the last 72 hours. CBG: Recent Labs  Lab 04/24/21 1259 04/24/21 1648 04/24/21 2031 04/25/21 0616 04/25/21 1108  GLUCAP 229* 167* 111* 115* 193*   Lipid Profile: Recent Labs    04/24/21 0054  CHOL 105  HDL 36*  LDLCALC 57  TRIG 59  CHOLHDL 2.9   Thyroid Function Tests: No results for input(s): TSH, T4TOTAL, FREET4, T3FREE, THYROIDAB in the last 72 hours. Anemia Panel: No results for input(s): VITAMINB12, FOLATE, FERRITIN, TIBC, IRON, RETICCTPCT in the last 72 hours. Sepsis Labs: Recent Labs  Lab 04/20/21 0909 04/21/21 2774  LATICACIDVEN 2.5* 1.3    Recent Results (from the past 240 hour(s))  Culture, blood (Routine X 2) w Reflex to ID Panel     Status: None (Preliminary result)   Collection Time: 04/23/21  1:15 PM   Specimen: BLOOD  Result Value Ref Range Status   Specimen Description BLOOD LEFT ANTECUBITAL  Final   Special Requests AEROBIC BOTTLE ONLY Blood Culture adequate volume  Final   Culture   Final    NO GROWTH 2 DAYS Performed at [facility], [address], [place], [place] [address]    Report Status PENDING  Incomplete  Culture, blood (Routine X 2) w Reflex to ID Panel     Status: None (Preliminary result)   Collection Time: 04/23/21  1:15 PM   Specimen: BLOOD  Result Value Ref Range Status   Specimen Description BLOOD LEFT ANTECUBITAL  Final   Special Requests AEROBIC BOTTLE ONLY Blood Culture adequate volume  Final    Culture   Final    NO GROWTH 2 DAYS Performed at [facility], [place] [address], [place], [place] [address]    Report Status PENDING  Incomplete     Radiology Studies: VAS [place] LOWER  EXTREMITY ARTERIAL DUPLEX  Result Date: 04/24/2021 LOWER EXTREMITY ARTERIAL DUPLEX STUDY Patient Name:  SKYY MCKNIGHT  Date of Exam:   04/23/2021 Medical Rec #: [id number]       Accession #:    [id number] Date of Birth: [birth year]       Patient Gender: M Patient Age:   12 years Exam Location:  [facility] Procedure:      VAS [place] LOWER EXTREMITY ARTERIAL DUPLEX Referring Phys: Jamelle Haring --------------------------------------------------------------------------------  Indications: Right AKA and Check for fluid collection/infection. High Risk Factors: Hypertension, Diabetes, past history of smoking. Other Factors: Dementia.  Current ABI: n/a Limitations: Staples, tissue properties Comparison Study: No prior study Performing Technologist: Sharion Dove RVS  Examination Guidelines: A complete evaluation includes B-mode imaging, spectral Doppler, color Doppler, and power Doppler as needed of all accessible portions of each vessel. Bilateral testing is considered an integral part of a complete examination. Limited examinations for reoccurring indications may be performed as noted.   Summary: Right: Cystic area with mixed echogenicites noted in the right groin. Known DVT observed during attempted thrombectomy 10/17, still present in the right femoral vein.  Electronically signed by Orlie Pollen on 04/24/2021 at 2:35:42 PM.    Final     Scheduled Meds:  sodium chloride   Intravenous Once   Chlorhexidine Gluconate Cloth  6 each Topical Daily   heparin  5,000 Units Subcutaneous Q8H    HYDROmorphone (DILAUDID) injection  1 mg Intravenous Once   insulin aspart  0-15 Units Subcutaneous TID WC   insulin glargine-yfgn  10 Units Subcutaneous Daily   nicotine  14 mg Transdermal Daily   Continuous Infusions:   cefTRIAXone (ROCEPHIN)  IV 2 g (04/25/21 0832)   vancomycin 1,000 mg (04/25/21 0953)     LOS: 15 days    Time spent:  25 mins    Marirose Deveney, MD [facility]   If 7PM-7AM, please contact night-coverage

## 2021-04-25 NOTE — Plan of Care (Signed)
  Problem: Health Behavior/Discharge Planning: Goal: Ability to manage health-related needs will improve Outcome: Not Progressing   Problem: Activity: Goal: Risk for activity intolerance will decrease Outcome: Not Progressing   

## 2021-04-25 NOTE — Progress Notes (Addendum)
      Subjective  - No changes   Objective (!) 130/54 70 98.8 F (37.1 C) (Oral) 20 97%  Intake/Output Summary (Last 24 hours) at 04/25/2021 0717 Last data filed at 04/25/2021 0520 Gross per 24 hour  Intake 500 ml  Output 1250 ml  Net -750 ml    Right groin with SS drainage, staple intact Right AKA stump warm with healing well with intact staples Heart RRR, few PVC's Lungs non labored breathing  Assessment/Planning: POD # 11 right AKA  Incisions are intact with continued SS drainage from the groin Continue dressing changes BID dry guaze On IV antibiotics.  Cultures with no growth to date   Roxy Horseman 04/25/2021 7:17 AM --  Laboratory Lab Results: Recent Labs    04/23/21 0413 04/24/21 0054 04/25/21 0135  WBC 144.7*  --   --   HGB 7.6* 7.6* 7.2*  HCT 24.2* 24.8* 23.3*  PLT 192  --   --    BMET Recent Labs    04/24/21 0054 04/25/21 0135  NA 134* 137  K 4.6 4.3  CL 105 107  CO2 24 24  GLUCOSE 214* 116*  BUN 22 20  CREATININE 0.92 0.87  CALCIUM 8.3* 8.3*    COAG No results found for: INR, PROTIME No results found for: PTT  I have independently interviewed and examined patient and agree with PA assessment and plan above.  We attempted to place a Prevena but unfortunately patient self removed.  He does still have drainage from the groin but does not appear infected.  I discussed with the family at bedside prefer not to operate unless necessary which would require removal of the graft and possible wound VAC placement in the groin.  We will continue to monitor the drainage.  Rowen Hur C. Donzetta Matters, MD [facility] [place] Office: [phone] Pager: [phone]

## 2021-04-25 NOTE — Progress Notes (Signed)
Mobility Specialist: Progress Note   04/25/21 1340  Mobility  Activity Transferred:  Bed to chair  Level of Assistance +2 (takes two people)  Assistive Device Stedy  Mobility Out of bed to chair with meals  Mobility Response Tolerated fair  Mobility performed by Mobility specialist;Other (comment) (OT Almyra Free)  Bed Position Chair  $Mobility charge 1 Mobility   Pt required modA to sit EOB from supine and +2 modA to stand at the stedy. Pt able to stand for roughly one minute to be cleaned up before needing to sit and then was able to stand for transfer. Pt has call bell at his side. Chair alarm is on.   [facility] Scott Garrett Mobility Specialist Mobility Specialist Phone: [phone]

## 2021-04-26 LAB — BPAM RBC
Blood Product Expiration Date: 202211052359
ISSUE DATE / TIME: 202211011033
Unit Type and Rh: 6200

## 2021-04-26 LAB — GLUCOSE, CAPILLARY
Glucose-Capillary: 175 mg/dL — ABNORMAL HIGH (ref 70–99)
Glucose-Capillary: 137 mg/dL — ABNORMAL HIGH (ref 70–99)
Glucose-Capillary: 149 mg/dL — ABNORMAL HIGH (ref 70–99)
Glucose-Capillary: 222 mg/dL — ABNORMAL HIGH (ref 70–99)

## 2021-04-26 LAB — BASIC METABOLIC PANEL
Anion gap: 5 (ref 5–15)
BUN: 21 mg/dL (ref 8–23)
CO2: 25 mmol/L (ref 22–32)
Calcium: 8.1 mg/dL — ABNORMAL LOW (ref 8.9–10.3)
Chloride: 104 mmol/L (ref 98–111)
Creatinine, Ser: 0.88 mg/dL (ref 0.61–1.24)
GFR, Estimated: >60 mL/min (ref >60)
Glucose, Bld: 207 mg/dL — ABNORMAL HIGH (ref 70–99)
Potassium: 4.7 mmol/L (ref 3.5–5.1)
Sodium: 134 mmol/L — ABNORMAL LOW (ref 135–145)

## 2021-04-26 LAB — RESP PANEL BY RT-PCR (FLU A&B, COVID) ARPGX2
Influenza A by PCR: NEGATIVE
Influenza B by PCR: NEGATIVE
SARS Coronavirus 2 by RT PCR: NEGATIVE

## 2021-04-26 LAB — PHOSPHORUS: Phosphorus: 2.6 mg/dL (ref 2.5–4.6)

## 2021-04-26 LAB — TYPE AND SCREEN
ABO/RH(D): A POS
Antibody Screen: NEGATIVE
Unit division: 0

## 2021-04-26 LAB — CBC
HCT: 27.7 % — ABNORMAL LOW (ref 39.0–52.0)
Hemoglobin: 8.8 g/dL — ABNORMAL LOW (ref 13.0–17.0)
MCH: 29.6 pg (ref 26.0–34.0)
MCHC: 31.8 g/dL (ref 30.0–36.0)
MCV: 93.3 fL (ref 80.0–100.0)
Platelets: 178 10*3/uL (ref 150–400)
RBC: 2.97 MIL/uL — ABNORMAL LOW (ref 4.22–5.81)
RDW: 16.5 % — ABNORMAL HIGH (ref 11.5–15.5)
WBC: 116.9 10*3/uL (ref 4.0–10.5)
nRBC: 0 % (ref 0.0–0.2)

## 2021-04-26 LAB — MAGNESIUM: Magnesium: 1.5 mg/dL — ABNORMAL LOW (ref 1.7–2.4)

## 2021-04-26 LAB — SURGICAL PATHOLOGY

## 2021-04-26 MED ORDER — MAGNESIUM SULFATE 2 GM/50ML IV SOLN
2.0000 g | Freq: Once | INTRAVENOUS | Status: AC
  Administered 2021-04-26: 2 g via INTRAVENOUS
  Filled 2021-04-26: qty 50

## 2021-04-26 MED ORDER — ENOXAPARIN SODIUM 40 MG/0.4ML IJ SOSY
40.0000 mg | PREFILLED_SYRINGE | INTRAMUSCULAR | Status: DC
  Administered 2021-04-26 – 2021-04-27 (×2): 40 mg via SUBCUTANEOUS
  Filled 2021-04-26 (×2): qty 0.4

## 2021-04-26 NOTE — Progress Notes (Addendum)
[facility]  Subjective  - Alert no complaints of pain.   Objective (!) 146/67 70 98.3 F (36.8 C) (Oral) 20 100%  Intake/Output Summary (Last 24 hours) at 04/26/2021 0732 Last data filed at 04/26/2021 0600 Gross per 24 hour  Intake 1535 ml  Output 2200 ml  Net -665 ml    Right groin with clear/straw drainage  Staples intact no evidence of dehiscence Stump warm with healing incision appears viable  Assessment/Planning: POD # 12 AKA  Afebrile without growth on cultures day #3 Will continue to observe groin incision.  Continue dry guaze multiple times a day. IV antibiotics have been stopped as of 04/25/21    Roxy Horseman 04/26/2021 7:32 AM --  Laboratory Lab Results: Recent Labs    04/25/21 1609 04/26/21 0200  WBC  --  116.9*  HGB 9.1* 8.8*  HCT 28.7* 27.7*  PLT  --  178   BMET Recent Labs    04/25/21 0135 04/26/21 0200  NA 137 134*  K 4.3 4.7  CL 107 104  CO2 24 25  GLUCOSE 116* 207*  BUN 20 21  CREATININE 0.87 0.88  CALCIUM 8.3* 8.1*    COAG No results found for: INR, PROTIME No results found for: PTT  I have independently interviewed and examined patient and agree with PA assessment and plan above.  Drainage appears to be decreasing with no change of the dressing since third shift.  We will continue to monitor without plans for any surgical intervention at this time.  Purva Vessell C. Donzetta Matters, MD [facility] [place] Office: [phone] Pager: [phone]

## 2021-04-26 NOTE — Progress Notes (Signed)
Mobility Specialist: Progress Note   04/26/21 1822  Mobility  Activity Transferred to/from [facility]  Level of Assistance +2 (takes two people)  Secondary school teacher Out of bed for toileting  Mobility Response Tolerated fair  Mobility performed by Mobility specialist;Nurse tech  $Mobility charge 1 Mobility   Pt assisted to/from [facility] with assistance from NT and myself. Pt required modA to stand from EOB as well as BSC, BM sucessful. Pt back to bed with NT present in the room.   [facility] Valdis Bevill Mobility Specialist Mobility Specialist Phone: [phone]

## 2021-04-26 NOTE — TOC Progression Note (Addendum)
Transition of Care [facility]) - Progression Note    Patient Details  Name: Scott Garrett MRN: [id number] Date of Birth: 1946-06-06  Transition of Care [facility]) CM/SW [place], [place] Phone Number: 04/26/2021, 3:29 PM  Clinical Narrative:     CSW updated daughter- informed of possible anticipated d/c tomorrow to [facility]. Patient must remain out of mittens for 24 hrs.  Provided letter to patient's daughter- indicating patient's dated of hospitalization and that he will d/c to SNF for patient's leasing office.   CSW will continue to follow and assist with discharge planning.   [name], MSW, LCSW Clinical Social Worker     Expected Discharge Plan: Skilled Nursing Facility Barriers to Discharge: Awaiting State Approval (PASRR), Insurance Authorization, Continued Medical Work up, SNF Pending bed offer  Expected Discharge Plan and Services Expected Discharge Plan: [place] In-house Referral: Clinical Social Work     Living arrangements for the past 2 months: Apartment                                       Social Determinants of Health (SDOH) Interventions    Readmission Risk Interventions No flowsheet data found.

## 2021-04-26 NOTE — Progress Notes (Addendum)
PROGRESS NOTE    Scott Garrett  WLN:[id number] DOB: [birth year] DOA: 04/10/2021 PCP: [name]    Brief Narrative:  Scott Garrett was admitted to the hospital with the working diagnosis of right lower extremity limb ischemic sp revascularization, now SP above the knee amputation.    75 year old male past medical history for dementia, type 2 diabetes mellitus and hypertension who presented to the hospital after being found down.  Last time patient was seen by his caregiver was about 5 days prior to hospitalization.  He was found sitting on the floor.  His right foot has discoloration and was tender to palpation.  On his initial physical examination blood pressure 149/84, heart rate 104, respiratory rate 16, temperature 98.4, oxygen saturation 92%.  His lungs are clear to auscultation bilaterally, heart S1-S2, present, rhythmic, abdomen was soft nontender, no lower extremity edema.  On his right lower extremity no pulse per doppler analysis.   Sodium 137, potassium 4.7, chloride 99, bicarb 27, glucose 367, BUN 36, creatinine 1.10, white count 134.8, hemoglobin 12.4, hematocrit 38.7, platelets 169. SARS COVID-19 negative.   Urinalysis specific gravity 1.012, 30 protein, > 50 white cells, 6-10 red cells.   EKG 101 bpm, left axis deviation, sinus rhythm, normal intervals, no significant ST segment or T wave changes.   Vascular surgery was consulted, and he was taken to the operating room emergently on 10/17, for lower extremity revascularization with fasciotomies.   Flow cytometry done on 10/18 showed atypical T-cell population which compromises 90% of all lymphocytes consistent with T-cell lymphoproliferative disorder. Right inguinal lymph node biopsy was positive for T-cell lymphoproliferative disorder.   Despite patient being on heparin he lost Doppler signals on his right foot and right calf, he was taken to the operating room on 10/21 and underwent above-the-knee amputation.    He required PRBC (4 units) transfusion for acute blood loss anemia   He had positive serosanguineous drainage from bandage at the site of amputation, he received antibiotic therapy, that now have been discontinued 04/25/21.      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  Patient with no significant pain the surgical site, he has been afebrile. Positive leukocytosis due to lymphoproliferative disorder.  Now patient off antibiotic therapy, continue close monitoring.  Ok to discontinue telemetry and change level of care to medical surgical ward.   2. Acute blood loss anemia Follow up hgb is 8,8 and hct is 27,7. Had 4 units PRBC transfused during his hospitalizations. Continue close follow up on Hgb as outpatient. No signs of local bleeding at the surgical site.    3. New diagnosis of B cell lymphoproliferative disorder  He appears to have a low-grade lymphoproliferative disorder Patient will need to follow up with hematology as outpatient.    4. AKI, hypomagnesemia, lactic acidosis   renal function stable with serum cr at 0.88, K is 4,7 and serum bicarbonate at 25.  5. Acute metabolic encephalopathy with delirium, dementia. Stage 2 pressure ulcer at the ischial tuberosity (not clear if present on admission) Patient with mittens this am.  To my examination he is not agitated, will trial to remove restrains,.    6. T2DM capillary glucose has  remained stable at 175 and 137 Continue sliding scale for glucose cover and monitoring.   7. Tobacco abuse. Continue with nicotine patch.   Status is: Inpatient  DVT prophylaxis: Enoxaparin   Code Status:    full  Family Communication:   No  family at the bedside    Skin Documentation: Pressure Injury 04/22/21 Ischial tuberosity Mid Stage 2 -  Partial thickness loss of dermis presenting as a shallow open injury with a red, pink wound bed without slough. 2 small open red areas (Active)  04/22/21 2000  Location: Ischial tuberosity  Location Orientation: Mid  Staging: Stage 2 -  Partial thickness loss of dermis presenting as a shallow open injury with a red, pink wound bed without slough.  Wound Description (Comments): 2 small open red areas  Present on Admission:      Consultants:  Vascular surgery   Procedures:  Right lower extremity revascularization and AKA      Subjective: Patient with no nausea or vomiting, denies significant pain at the right lower extremity, no nausea or vomiting, no headache.   Objective: Vitals:   04/26/21 0032 04/26/21 0328 04/26/21 0749 04/26/21 1137  BP: 128/62 (!) 146/67 139/66 (!) 142/67  Pulse: 69 70 60 63  Resp: 20 20 13 11   Temp: 98.3 F (36.8 C) 98.3 F (36.8 C) 98.4 F (36.9 C) 98.5 F (36.9 C)  TempSrc: Oral Oral Oral Oral  SpO2: 100% 100% 100% 100%  Weight:  82.1 kg    Height:        Intake/Output Summary (Last 24 hours) at 04/26/2021 1138 Last data filed at 04/26/2021 0600 Gross per 24 hour  Intake 1295 ml  Output 2200 ml  Net -905 ml   Filed Weights   04/20/21 0336 04/25/21 0326 04/26/21 0328  Weight: 82.8 kg 83.8 kg 82.1 kg    Examination:   General: Not in pain or dyspnea, deconditioned  Neurology: Awake and alert, non focal, somnolent but easy to arouse.  E ENT:  mild pallor, no icterus, oral mucosa moist Cardiovascular: No JVD. S1-S2 present, rhythmic, no gallops, rubs, or murmurs. No lower extremity edema. Pulmonary: positive breath sounds bilaterally, adequate air movement, no wheezing, rhonchi or rales. Gastrointestinal. Abdomen soft and non tender Skin. No rashes Musculoskeletal: right AKA with clear wounds, no local edema or erythema,       Data Reviewed: I have personally reviewed following labs and imaging studies  CBC: Recent Labs  Lab 04/20/21 0128 04/22/21 0050 04/23/21 0413 04/24/21 0054 04/25/21 0135 04/25/21 1609 04/26/21 0200  WBC 128.8* 125.4* 144.7*  --   --   --  116.9*  HGB 7.5* 7.5* 7.6* 7.6* 7.2* 9.1* 8.8*  HCT 24.2* 23.6* 24.2* 24.8* 23.3* 28.7* 27.7*  MCV 94.9 95.5 96.4  --   --   --  93.3  PLT 200 199 192  --   --   --  270   Basic Metabolic Panel: Recent Labs  Lab 04/20/21 0128 04/21/21 0128 04/23/21 0413 04/24/21 0054 04/25/21 0135 04/26/21 0200  NA 133* 135 136 134* 137 134*  K 4.4 4.3 4.6 4.6 4.3 4.7  CL 104 105 105 105 107 104  CO2 25 25 26 24 24 25   GLUCOSE 307* 205* 158* 214* 116* 207*  BUN 17 16 18 22 20 21   CREATININE 0.94 0.87 0.83 0.92 0.87 0.88  CALCIUM 8.0* 8.0* 8.4* 8.3* 8.3* 8.1*  MG 1.7  --   --   --   --  1.5*  PHOS 2.5  --   --   --   --  2.6   GFR: Estimated Creatinine Clearance: 78.4 mL/min (by C-G formula based on SCr of 0.88 mg/dL). Liver Function Tests: No results for input(s): AST, ALT, ALKPHOS, BILITOT, PROT, ALBUMIN in the last 168 hours. No results for input(s): LIPASE, AMYLASE in the last 168 hours. No results for input(s): AMMONIA in the last 168 hours. Coagulation Profile: No results for input(s): INR, PROTIME in the last 168 hours. Cardiac Enzymes: No results for input(s): CKTOTAL, CKMB, CKMBINDEX, TROPONINI in the last 168 hours. BNP (last 3 results) No results for input(s): PROBNP in the last 8760 hours. HbA1C: No results for input(s): HGBA1C in the last 72 hours. CBG: Recent Labs  Lab 04/25/21 1108 04/25/21 1636 04/25/21 2014 04/26/21 0609 04/26/21 1135  GLUCAP 193* 265* 119* 175* 137*   Lipid Profile: Recent Labs    04/24/21 0054  CHOL 105  HDL 36*  LDLCALC 57  TRIG 59  CHOLHDL 2.9   Thyroid Function Tests: No results for input(s): TSH, T4TOTAL, FREET4, T3FREE, THYROIDAB in the last 72 hours. Anemia Panel: No results  for input(s): VITAMINB12, FOLATE, FERRITIN, TIBC, IRON, RETICCTPCT in the last 72 hours.    Radiology Studies: I have reviewed all of the imaging during this hospital visit personally     Scheduled Meds:  sodium chloride   Intravenous Once   Chlorhexidine Gluconate Cloth  6 each Topical Daily   heparin  5,000 Units Subcutaneous Q8H    HYDROmorphone (DILAUDID) injection  1 mg Intravenous Once   insulin aspart  0-15 Units Subcutaneous TID WC   insulin glargine-yfgn  10 Units Subcutaneous Daily   nicotine  14 mg Transdermal Daily   Continuous Infusions:  cefTRIAXone (ROCEPHIN)  IV 2 g (04/26/21 0924)   vancomycin 1,000 mg (04/26/21 1007)     LOS: 16 days        Scott Garrett Gerome Apley, MD

## 2021-04-26 NOTE — Discharge Instructions (Signed)
Please follow up with Dr. Benay Spice on 05/11/2021 at 0800--arrive 15 minutes early. Call office at [phone] with any questions or concerns.

## 2021-04-27 DIAGNOSIS — L89302 Pressure ulcer of unspecified buttock, stage 2: Secondary | ICD-10-CM

## 2021-04-27 DIAGNOSIS — D479 Neoplasm of uncertain behavior of lymphoid, hematopoietic and related tissue, unspecified: Secondary | ICD-10-CM

## 2021-04-27 LAB — GLUCOSE, CAPILLARY
Glucose-Capillary: 183 mg/dL — ABNORMAL HIGH (ref 70–99)
Glucose-Capillary: 202 mg/dL — ABNORMAL HIGH (ref 70–99)
Glucose-Capillary: 171 mg/dL — ABNORMAL HIGH (ref 70–99)

## 2021-04-27 NOTE — TOC Transition Note (Signed)
Transition of Care [facility]) - CM/SW Discharge Note   Patient Details  Name: Scott Garrett MRN: [id number] Date of Birth: [birth year]  [facility] CM/SW Contact:  Vinie Sill, LCSW Phone Number: 04/27/2021, 1:27 PM   Clinical Narrative:     Patient will Discharge to: [facility] Discharge Date: 04/27/2021 Family Notified: daughter, Network engineer [email or web address]  Per MD patient is ready for discharge. RN, patient, and facility notified of discharge. Discharge Summary sent to facility. RN given number for [phone], Room 109. Ambulance transport requested for patient.   Clinical Social Worker signing off.  [name], MSW, LCSW Clinical Social Worker     Final next level of care: Skilled Nursing Facility Barriers to Discharge: Barriers Resolved   Patient Goals and CMS Choice        Discharge Placement              Patient chooses bed at: [facility] Patient to be transferred to facility by: [facility] Name of family member notified: daugter,Nakita Patient and family notified of of transfer: 04/27/21  Discharge Plan and Services In-house Referral: Clinical Social Work                                   Social Determinants of Health (SDOH) Interventions     Readmission Risk Interventions No flowsheet data found.

## 2021-04-27 NOTE — Progress Notes (Signed)
Called report to nurse Danae Chen at [facility]. IV and telemetry removed. Pt awaiting [facility] transport to facility.  Raelyn Number, RN

## 2021-04-27 NOTE — Progress Notes (Addendum)
  Progress Note    04/27/2021 7:55 AM 13 Days Post-Op  Subjective:  no complaints   Vitals:   04/26/21 2335 04/27/21 0405  BP: (!) 128/53 137/68  Pulse: 60 62  Resp: 17 16  Temp: 98 F (36.7 C) 98.6 F (37 C)  SpO2: 100% 100%   Physical Exam: Cardiac:  regular Lungs:  non labored Incisions:  Right groin with staples intact. Serosanguinous drainage Extremities:  Right AKA stump well appearing Neurologic: alert and oriented  CBC    Component Value Date/Time   WBC 116.9 (HH) 04/26/2021 0200   RBC 2.97 (L) 04/26/2021 0200   HGB 8.8 (L) 04/26/2021 0200   HCT 27.7 (L) 04/26/2021 0200   PLT 178 04/26/2021 0200   MCV 93.3 04/26/2021 0200   MCH 29.6 04/26/2021 0200   MCHC 31.8 04/26/2021 0200   RDW 16.5 (H) 04/26/2021 0200   LYMPHSABS 112.4 (H) 04/10/2021 2130   MONOABS 4.0 (H) 04/10/2021 2130   EOSABS 0.0 04/10/2021 2130   BASOSABS 0.0 04/10/2021 2130    BMET    Component Value Date/Time   NA 134 (L) 04/26/2021 0200   K 4.7 04/26/2021 0200   CL 104 04/26/2021 0200   CO2 25 04/26/2021 0200   GLUCOSE 207 (H) 04/26/2021 0200   BUN 21 04/26/2021 0200   CREATININE 0.88 04/26/2021 0200   CALCIUM 8.1 (L) 04/26/2021 0200   GFRNONAA >60 04/26/2021 0200    INR No results found for: INR   Intake/Output Summary (Last 24 hours) at 04/27/2021 0755 Last data filed at 04/27/2021 0406 Gross per 24 hour  Intake 766 ml  Output 2050 ml  Net -1284 ml     Assessment/Plan:  75 y.o. 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 Off Abx Will continue to monitor groin. No surgery at this time   Marval Regal [facility] [phone] 04/27/2021 7:55 AM  I have independently interviewed and examined patient and agree with PA assessment and plan above.  Serous drainage appears to be decreasing hopefully we can avoid  surgery.  He does have retained PTFE and I have discussed with the family that any signs of infection would require reoperation and removal of the graft.  Lamiya Naas C. Donzetta Matters, MD [facility] [place] Office: [phone] Pager: [phone]

## 2021-04-27 NOTE — Discharge Summary (Addendum)
Physician Discharge Summary  Scott Garrett VZC:[id number] DOB: 07-27-1945 DOA: 04/10/2021  PCP: [name]  Admit date: 04/10/2021 Discharge date: 04/27/2021  Admitted From: Home  Disposition:   SNF   Recommendations for Outpatient Follow-up and new medication changes:  Follow up with [facility] clinic in 7 to 10 days.  Continue local wound care to right groin incision with dry gauze.  Follow up with vascular surgery as scheduled.  Follow up with Oncology, Dr. Benay Spice at the cancer center.  Follow-up electrolytes as an outpatient.  I spoke over the phone with the patient's daughter about patient's  condition, plan of care, prognosis and all questions were addressed.   Home Health: na   Equipment/Devices: na    Discharge Condition: stable  CODE STATUS: full  Diet recommendation:  heart healthy   Brief/Interim Summary: [name] was admitted to the hospital with the working diagnosis of right lower extremity limb ischemic sp revascularization, now SP above the knee amputation.    75 year old male past medical history for dementia, type 2 diabetes mellitus and hypertension who presented to the hospital after being found down.  Last time patient was seen by his caregiver was about 5 days prior to hospitalization.  He was found sitting on the floor.  His right foot had discoloration and was tender to palpation.  On his initial physical examination blood pressure 149/84, heart rate 104, respiratory rate 16, temperature 98.4, oxygen saturation 92%.  His lungs were clear to auscultation bilaterally, heart S1-S2, present, rhythmic, abdomen was soft nontender, no lower extremity edema.  On his right lower extremity no pulse per doppler analysis.   Sodium 137, potassium 4.7, chloride 99, bicarb 27, glucose 367, BUN 36, creatinine 1.10, white count 134.8, hemoglobin 12.4, hematocrit 38.7, platelets 169. SARS COVID-19 negative.   Urinalysis specific gravity 1.012, 30 protein,  > 50 white cells, 6-10 red cells.   EKG 101 bpm, left axis deviation, sinus rhythm, normal intervals, no significant ST segment or T wave changes.   Vascular surgery was consulted, and he was taken to the operating room emergently on 10/17, for lower extremity revascularization with fasciotomies.   Because persistent leukocytosis flow cytometry was done on 10/18 showed atypical T-cell population which compromises 90% of all lymphocytes consistent with T-cell lymphoproliferative disorder. Right inguinal lymph node biopsy was positive for T-cell lymphoproliferative disorder.   Despite patient being on heparin he lost Doppler signals on his right foot and right calf, he was taken to the operating room on 10/21 and underwent above-the-knee amputation.   He required PRBC (4 units) transfusion for acute blood loss anemia   He had positive serosanguineous drainage groin incision, he received antibiotic therapy, that now have been discontinued as per 04/25/21.   Patient will continue follow up with vascular surgery as outpatient.   Right lower extremity limb ischemia, status post revascularization, now status post above-the-knee amputation.  No sepsis. Patient had a prolonged hospitalization, he underwent emergent revascularization, unfortunately he also required above-the-knee amputation. His amputation wound is healing well, he does have a serosanguineous drainage from his right groin incision, no further antibiotic therapy. Continue dry gauze dressing, follow-up as an outpatient with vascular surgery.  2.  Acute blood loss anemia.  He received 4 units packed red blood cell transfusion with good toleration, his discharge hemoglobin is 8.8, hematocrit 27.7.  Platelets 178.  3.  New diagnosis of B-cell lymphoproliferative disorder.  He appears to have a low-grade lymphoproliferative disorder. His discharge white cell count is 116.9.  Plan to follow-up as an outpatient with oncology Dr.  Benay Spice.  4.  Acute kidney injury, hypomagnesemia.  Lactic acidosis.  Patient received supportive medical therapy, his kidney function improved along with his electrolytes. At discharge sodium 134, potassium 4.7, chloride 1 4, bicarb 25, BUN 21 and creatinine 0.81. Patient is tolerating p.o. diet adequately. Follow-up electrolytes as an outpatient.  5.  Acute metabolic encephalopathy with delirium, dementia.  Stage II pressure ulcers at the ischial tuberosity, not clear if present on admission.  Patient received supportive medical therapy, he has been off mittens for the last 24 hours. Continue donepezil.  6.  Type 2 diabetes mellitus.  During his hospitalization his glucose remained well controlled with basal insulin and sliding scale. At discharge patient will resume metformin.  7.  Tobacco abuse.  Continue smoking cessation counseling.  He had nicotine patch while hospitalized.  8. HTN. Patient will resume lisinopril for blood pressure control at discharge.   Discharge Diagnoses:  Principal Problem:   Critical limb ischemia of right lower extremity ([place]) Active Problems:   Dementia ([place])   DM2 (diabetes mellitus, type 2) ([place])   HTN (hypertension)   Pressure injury of skin   Lymphoproliferative disease ([place])    Discharge Instructions   Allergies as of 04/27/2021   No Known Allergies      Medication List     TAKE these medications    Cholecalciferol 25 MCG (1000 UT) Tbdp Take 1 tablet by mouth daily.   donepezil 10 MG tablet Commonly known as: ARICEPT Take 10 mg by mouth at bedtime.   lisinopril 5 MG tablet Commonly known as: ZESTRIL Take 5 mg by mouth daily.   metFORMIN 500 MG tablet Commonly known as: GLUCOPHAGE Take 500 mg by mouth 2 (two) times daily with a meal.               Discharge Care Instructions  (From admission, onward)           Start     Ordered   04/27/21 0000  Discharge wound care:       Comments: Please apply dry gauze to  right groin surgical wounds daily and as needed.   04/27/21 0930            Follow-up Information     Waynetta Sandy, MD Follow up.   Specialties: Vascular Surgery, Cardiology Why: Office will call you to arrange your appt (sent) Contact information: [place] [address] [phone]                No Known Allergies  Consultations: Vascular surgery    Procedures/Studies: DG Chest Port 1 View  Result Date: 04/10/2021 CLINICAL DATA:  Weakness EXAM: PORTABLE CHEST 1 VIEW COMPARISON:  None. FINDINGS: Heart and mediastinal contours are within normal limits. No focal opacities or effusions. No acute bony abnormality. IMPRESSION: No active disease. Electronically Signed   By: Rolm Baptise M.D.   On: 04/10/2021 20:31   VAS [place] LOWER EXTREMITY ARTERIAL DUPLEX  Result Date: 04/24/2021 LOWER EXTREMITY ARTERIAL DUPLEX STUDY Patient Name:  Scott Garrett  Date of Exam:   04/23/2021 Medical Rec #: [id number]       Accession #:    [id number] Date of Birth: 05/13/1946       Patient Gender: M Patient Age:   11 years Exam Location:  [facility] Procedure:      VAS [place] LOWER EXTREMITY ARTERIAL DUPLEX Referring Phys: Jamelle Haring --------------------------------------------------------------------------------  Indications: Right AKA  and Check for fluid collection/infection. High Risk Factors: Hypertension, Diabetes, past history of smoking. Other Factors: Dementia.  Current ABI: n/a Limitations: Staples, tissue properties Comparison Study: No prior study Performing Technologist: Sharion Dove RVS  Examination Guidelines: A complete evaluation includes B-mode imaging, spectral Doppler, color Doppler, and power Doppler as needed of all accessible portions of each vessel. Bilateral testing is considered an integral part of a complete examination. Limited examinations for reoccurring indications may be performed as noted.   Summary: Right: Cystic area with mixed  echogenicites noted in the right groin. Known DVT observed during attempted thrombectomy 10/17, still present in the right femoral vein.  Electronically signed by Orlie Pollen on 04/24/2021 at 2:35:42 PM.    Final    CT CHEST ABDOMEN PELVIS WO CONTRAST  Result Date: 04/13/2021 CLINICAL DATA:  New diagnosis CLL, staging EXAM: CT CHEST, ABDOMEN AND PELVIS WITHOUT CONTRAST TECHNIQUE: Multidetector CT imaging of the chest, abdomen and pelvis was performed following the standard protocol without IV contrast. COMPARISON:  None. FINDINGS: CT CHEST FINDINGS Cardiovascular: Aortic atherosclerosis. Normal heart size. Three-vessel coronary artery calcifications. No pericardial effusion. Mediastinum/Nodes: Prominent bilateral axillary lymph nodes, largest right axillary nodes measuring up to 1.8 x 1.3 cm (series 3, image 21). No enlarged mediastinal or hilar lymph nodes. Thyroid gland, trachea, and esophagus demonstrate no significant findings. Lungs/Pleura: Mild centrilobular emphysema. Small bilateral pleural effusions and associated atelectasis or consolidation. Underlying bandlike scarring of the bilateral lung bases, right greater than left. Musculoskeletal: No chest wall mass or suspicious bone lesions identified. Anasarca. CT ABDOMEN PELVIS FINDINGS Hepatobiliary: No solid liver abnormality is seen. No gallstones, gallbladder wall thickening, or biliary dilatation. Pancreas: Unremarkable. No pancreatic ductal dilatation or surrounding inflammatory changes. Spleen: Splenomegaly, maximum span 14.8 cm. Adrenals/Urinary Tract: Adrenal glands are unremarkable. Kidneys are normal, without renal calculi, solid lesion, or hydronephrosis. Bladder is unremarkable. Stomach/Bowel: Stomach is within normal limits. Appendix appears normal. No evidence of bowel wall thickening, distention, or inflammatory changes. Vascular/Lymphatic: Aortic atherosclerosis. Enlarged bilateral iliac, pelvic sidewall, and inguinal lymph nodes,  largest left iliac node measuring 2.4 x 1.5 cm (series 3, image 98), largest right pelvic sidewall lymph node measuring 2.5 x 1.2 cm (series 3, image 106), and largest right inguinal nodes measuring 1.4 x 1.2 cm (series 3, image 122). Reproductive: No mass or other abnormality. Other: No abdominal wall hernia. Anasarca. Incision over the right femoral vessels with a subcutaneous hematoma or seroma measuring 5.4 x 3.2 cm. Scattered small air loculations within this collection (series 3, image 116). No abdominopelvic ascites. Musculoskeletal: No acute or significant osseous findings. IMPRESSION: 1. Enlarged bilateral axillary, iliac, pelvic sidewall, and inguinal lymph nodes, as detailed above and in keeping with reported diagnosis of CLL. There is no lymphadenopathy internal to the chest or in the upper abdomen. 2. Splenomegaly. 3. Incision over the right femoral vessels with a subcutaneous hematoma or seroma measuring 5.4 x 3.2 cm. Scattered small air loculations within this collection. Findings are consistent with recent biopsy. The presence or absence of infection is not established by CT. 4. Small bilateral pleural effusions and associated atelectasis or consolidation. Underlying bandlike scarring of the bilateral lung bases, right greater than left. 5. Mild emphysema. 6. Coronary artery disease. Aortic Atherosclerosis (ICD10-I70.0) and Emphysema (ICD10-J43.9). Electronically Signed   By: Delanna Ahmadi M.D.   On: 04/13/2021 16:04     Procedures:  04/11/21 1. Right lower extremity thrombectomy from below-knee popliteal and common femoral exposures 2.  Harvest of right greater saphenous vein below  the knee 3.  Right common femoral to peroneal artery bypass with 6 mm ringed PTFE 4.  Right lower extremity 4 compartment fasciotomies  04/14/21 Right above-the-knee amputation  Subjective: Patient is feeling well, no nausea or vomiting, no chest pain or dyspnea,.   Discharge Exam: Vitals:   04/27/21  0405 04/27/21 0830  BP: 137/68 137/66  Pulse: 62 64  Resp: 16 18  Temp: 98.6 F (37 C) 98.5 F (36.9 C)  SpO2: 100% 96%   Vitals:   04/26/21 1943 04/26/21 2335 04/27/21 0405 04/27/21 0830  BP: (!) 165/80 (!) 128/53 137/68 137/66  Pulse: 86 60 62 64  Resp: 20 17 16 18   Temp: 97.7 F (36.5 C) 98 F (36.7 C) 98.6 F (37 C) 98.5 F (36.9 C)  TempSrc: Oral Oral Oral Oral  SpO2: 98% 100% 100% 96%  Weight:      Height:        General: Not in pain or dyspnea  Neurology: Awake and alert, non focal  E ENT: no pallor, no icterus, oral mucosa moist Cardiovascular: No JVD. S1-S2 present, rhythmic, no gallops, rubs, or murmurs. No lower extremity edema. Pulmonary: positive breath sounds bilaterally, adequate air movement, no wheezing, rhonchi or rales. Gastrointestinal. Abdomen soft and non tender Skin. Surgical wounds are clean, right groin with small clear drainage on the inferior pole.  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       The results of significant diagnostics from this hospitalization (including imaging, microbiology, ancillary and laboratory) are listed below for reference.     Microbiology: Recent Results (from the past 240 hour(s))  Culture, blood (Routine X 2) w Reflex to ID Panel     Status: None (Preliminary result)   Collection Time: 04/23/21  1:15 PM   Specimen: BLOOD  Result Value Ref Range Status   Specimen Description BLOOD LEFT ANTECUBITAL  Final   Special Requests AEROBIC BOTTLE ONLY Blood Culture adequate volume  Final   Culture   Final    NO GROWTH 4 DAYS Performed at [facility], [address] [place], [place] [address]    Report Status PENDING  Incomplete  Culture, blood (Routine X 2) w Reflex to ID Panel     Status: None (Preliminary result)   Collection Time: 04/23/21  1:15 PM   Specimen: BLOOD  Result Value Ref Range Status   Specimen Description BLOOD LEFT ANTECUBITAL  Final   Special Requests AEROBIC  BOTTLE ONLY Blood Culture adequate volume  Final   Culture   Final    NO GROWTH 4 DAYS Performed at [facility], [place] [address], [place], [place] [address]    Report Status PENDING  Incomplete  Resp Panel by RT-PCR (Flu A&B, Covid) Nasopharyngeal Swab     Status: None   Collection Time: 04/26/21 10:00 AM   Specimen: Nasopharyngeal Swab; Nasopharyngeal(NP) swabs in vial transport medium  Result Value Ref Range Status   SARS Coronavirus 2 by RT PCR NEGATIVE NEGATIVE Final    Comment: (NOTE) SARS-CoV-2 target nucleic acids are NOT DETECTED.  The SARS-CoV-2 RNA is generally detectable in upper respiratory specimens during the acute phase of infection. The lowest concentration of SARS-CoV-2 viral copies this assay can detect is 138 copies/mL. A negative result does not preclude SARS-Cov-2 infection and should not be used as the sole basis for treatment or other patient management decisions. A negative result may occur with  improper specimen collection/handling, submission of specimen other than nasopharyngeal swab,  presence of viral mutation(s) within the areas targeted by this assay, and inadequate number of viral copies(<138 copies/mL). A negative result must be combined with clinical observations, patient history, and epidemiological information. The expected result is Negative.  Fact Sheet for Patients:  EntrepreneurPulse.com.au  Fact Sheet for Healthcare Providers:  IncredibleEmployment.be  This test is no t yet approved or cleared by the [place] FDA and  has been authorized for detection and/or diagnosis of SARS-CoV-2 by FDA under an Emergency Use Authorization (EUA). This EUA will remain  in effect (meaning this test can be used) for the duration of the COVID-19 declaration under Section 564(b)(1) of the Act, 21 U.S.C.section 360bbb-3(b)(1), unless the authorization is terminated  or revoked sooner.       Influenza A by PCR  NEGATIVE NEGATIVE Final   Influenza B by PCR NEGATIVE NEGATIVE Final    Comment: (NOTE) The Xpert Xpress SARS-CoV-2/FLU/RSV plus assay is intended as an aid in the diagnosis of influenza from Nasopharyngeal swab specimens and should not be used as a sole basis for treatment. Nasal washings and aspirates are unacceptable for Xpert Xpress SARS-CoV-2/FLU/RSV testing.  Fact Sheet for Patients: EntrepreneurPulse.com.au  Fact Sheet for Healthcare Providers: IncredibleEmployment.be  This test is not yet approved or cleared by the [place] FDA and has been authorized for detection and/or diagnosis of SARS-CoV-2 by FDA under an Emergency Use Authorization (EUA). This EUA will remain in effect (meaning this test can be used) for the duration of the COVID-19 declaration under Section 564(b)(1) of the Act, 21 U.S.C. section 360bbb-3(b)(1), unless the authorization is terminated or revoked.  Performed at [facility], [place] [address] [place], [place] [address]      Labs: BNP (last 3 results) No results for input(s): BNP in the last 8760 hours. Basic Metabolic Panel: Recent Labs  Lab 04/21/21 0128 04/23/21 0413 04/24/21 0054 04/25/21 0135 04/26/21 0200  NA 135 136 134* 137 134*  K 4.3 4.6 4.6 4.3 4.7  CL 105 105 105 107 104  CO2 25 26 24 24 25   GLUCOSE 205* 158* 214* 116* 207*  BUN 16 18 22 20 21   CREATININE 0.87 0.83 0.92 0.87 0.88  CALCIUM 8.0* 8.4* 8.3* 8.3* 8.1*  MG  --   --   --   --  1.5*  PHOS  --   --   --   --  2.6   Liver Function Tests: No results for input(s): AST, ALT, ALKPHOS, BILITOT, PROT, ALBUMIN in the last 168 hours. No results for input(s): LIPASE, AMYLASE in the last 168 hours. No results for input(s): AMMONIA in the last 168 hours. CBC: Recent Labs  Lab 04/22/21 0050 04/23/21 0413 04/24/21 0054 04/25/21 0135 04/25/21 1609 04/26/21 0200  WBC 125.4* 144.7*  --   --   --  116.9*  HGB 7.5* 7.6* 7.6* 7.2*  9.1* 8.8*  HCT 23.6* 24.2* 24.8* 23.3* 28.7* 27.7*  MCV 95.5 96.4  --   --   --  93.3  PLT 199 192  --   --   --  178   Cardiac Enzymes: No results for input(s): CKTOTAL, CKMB, CKMBINDEX, TROPONINI in the last 168 hours. BNP: Invalid input(s): POCBNP CBG: Recent Labs  Lab 04/26/21 0609 04/26/21 1135 04/26/21 1629 04/26/21 2119 04/27/21 0611  GLUCAP 175* 137* 149* 222* 183*   D-Dimer No results for input(s): DDIMER in the last 72 hours. Hgb A1c No results for input(s): HGBA1C in the last 72 hours. Lipid Profile No  results for input(s): CHOL, HDL, LDLCALC, TRIG, CHOLHDL, LDLDIRECT in the last 72 hours. Thyroid function studies No results for input(s): TSH, T4TOTAL, T3FREE, THYROIDAB in the last 72 hours.  Invalid input(s): FREET3 Anemia work up No results for input(s): VITAMINB12, FOLATE, FERRITIN, TIBC, IRON, RETICCTPCT in the last 72 hours. Urinalysis    Component Value Date/Time   COLORURINE YELLOW 04/11/2021 0328   APPEARANCEUR CLOUDY (A) 04/11/2021 0328   LABSPEC 1.012 04/11/2021 0328   PHURINE 5.0 04/11/2021 0328   GLUCOSEU 50 (A) 04/11/2021 0328   HGBUR MODERATE (A) 04/11/2021 0328   BILIRUBINUR NEGATIVE 04/11/2021 0328   KETONESUR 5 (A) 04/11/2021 0328   PROTEINUR 30 (A) 04/11/2021 0328   NITRITE NEGATIVE 04/11/2021 0328   LEUKOCYTESUR LARGE (A) 04/11/2021 0328   Sepsis Labs Invalid input(s): PROCALCITONIN,  WBC,  LACTICIDVEN Microbiology Recent Results (from the past 240 hour(s))  Culture, blood (Routine X 2) w Reflex to ID Panel     Status: None (Preliminary result)   Collection Time: 04/23/21  1:15 PM   Specimen: BLOOD  Result Value Ref Range Status   Specimen Description BLOOD LEFT ANTECUBITAL  Final   Special Requests AEROBIC BOTTLE ONLY Blood Culture adequate volume  Final   Culture   Final    NO GROWTH 4 DAYS Performed at [facility], [place] [address], [place], [place] [address]    Report Status PENDING  Incomplete  Culture, blood  (Routine X 2) w Reflex to ID Panel     Status: None (Preliminary result)   Collection Time: 04/23/21  1:15 PM   Specimen: BLOOD  Result Value Ref Range Status   Specimen Description BLOOD LEFT ANTECUBITAL  Final   Special Requests AEROBIC BOTTLE ONLY Blood Culture adequate volume  Final   Culture   Final    NO GROWTH 4 DAYS Performed at [facility], [place] [address] [place] [address]    Report Status PENDING  Incomplete  Resp Panel by RT-PCR (Flu A&B, Covid) Nasopharyngeal Swab     Status: None   Collection Time: 04/26/21 10:00 AM   Specimen: Nasopharyngeal Swab; Nasopharyngeal(NP) swabs in vial transport medium  Result Value Ref Range Status   SARS Coronavirus 2 by RT PCR NEGATIVE NEGATIVE Final    Comment: (NOTE) SARS-CoV-2 target nucleic acids are NOT DETECTED.  The SARS-CoV-2 RNA is generally detectable in upper respiratory specimens during the acute phase of infection. The lowest concentration of SARS-CoV-2 viral copies this assay can detect is 138 copies/mL. A negative result does not preclude SARS-Cov-2 infection and should not be used as the sole basis for treatment or other patient management decisions. A negative result may occur with  improper specimen collection/handling, submission of specimen other than nasopharyngeal swab, presence of viral mutation(s) within the areas targeted by this assay, and inadequate number of viral copies(<138 copies/mL). A negative result must be combined with clinical observations, patient history, and epidemiological information. The expected result is Negative.  Fact Sheet for Patients:  EntrepreneurPulse.com.au  Fact Sheet for Healthcare Providers:  IncredibleEmployment.be  This test is no t yet approved or cleared by the [place] FDA and  has been authorized for detection and/or diagnosis of SARS-CoV-2 by FDA under an Emergency Use Authorization (EUA). This EUA will remain  in  effect (meaning this test can be used) for the duration of the COVID-19 declaration under Section 564(b)(1) of the Act, 21 U.S.C.section 360bbb-3(b)(1), unless the authorization is terminated  or revoked sooner.  Influenza A by PCR NEGATIVE NEGATIVE Final   Influenza B by PCR NEGATIVE NEGATIVE Final    Comment: (NOTE) The Xpert Xpress SARS-CoV-2/FLU/RSV plus assay is intended as an aid in the diagnosis of influenza from Nasopharyngeal swab specimens and should not be used as a sole basis for treatment. Nasal washings and aspirates are unacceptable for Xpert Xpress SARS-CoV-2/FLU/RSV testing.  Fact Sheet for Patients: EntrepreneurPulse.com.au  Fact Sheet for Healthcare Providers: IncredibleEmployment.be  This test is not yet approved or cleared by the [place] FDA and has been authorized for detection and/or diagnosis of SARS-CoV-2 by FDA under an Emergency Use Authorization (EUA). This EUA will remain in effect (meaning this test can be used) for the duration of the COVID-19 declaration under Section 564(b)(1) of the Act, 21 U.S.C. section 360bbb-3(b)(1), unless the authorization is terminated or revoked.  Performed at [facility], [place] [address], [place], [place] [address]      Time coordinating discharge: 45 minutes  SIGNED:   Tawni Millers, MD  [facility] 04/27/2021, 8:57 AM

## 2021-04-27 NOTE — Progress Notes (Signed)
PT Cancellation Note  Patient Details Name: Scott Garrett MRN: [id number] DOB: [birth year]   Cancelled Treatment:    Reason Eval/Treat Not Completed: Patient discharging to SNF, will defer treatment.  Mabeline Caras, PT, DPT Acute Rehabilitation Services  Pager [phone] Office [place] 04/27/2021, 1:49 PM

## 2021-04-28 LAB — CULTURE, BLOOD (ROUTINE X 2)
Culture: NO GROWTH
Culture: NO GROWTH
Special Requests: ADEQUATE
Special Requests: ADEQUATE

## 2021-05-01 ENCOUNTER — Telehealth: Payer: Self-pay | Admitting: *Deleted

## 2021-05-01 NOTE — Telephone Encounter (Signed)
Call to [facility] and spoke w/transportation director, Dewaine Oats and provided her appointment information form Mr. Hallinan. They will transport. Also call daughter with appointment and location so she may attend as well.

## 2021-05-04 LAB — FLOW CYTOMETRY

## 2021-05-10 ENCOUNTER — Encounter: Payer: Medicare (Managed Care) | Admitting: Vascular Surgery

## 2021-05-11 ENCOUNTER — Encounter: Payer: Self-pay | Admitting: *Deleted

## 2021-05-11 ENCOUNTER — Inpatient Hospital Stay: Payer: Medicare (Managed Care)

## 2021-05-11 ENCOUNTER — Inpatient Hospital Stay: Payer: Medicare (Managed Care) | Attending: Oncology | Admitting: Oncology

## 2021-05-11 ENCOUNTER — Other Ambulatory Visit: Payer: Self-pay

## 2021-05-11 VITALS — BP 134/77 | HR 77 | Temp 98.0°F | Resp 18 | Ht 71.0 in

## 2021-05-11 DIAGNOSIS — D72829 Elevated white blood cell count, unspecified: Secondary | ICD-10-CM | POA: Diagnosis not present

## 2021-05-11 DIAGNOSIS — C8440 Peripheral T-cell lymphoma, not classified, unspecified site: Secondary | ICD-10-CM | POA: Insufficient documentation

## 2021-05-11 DIAGNOSIS — I10 Essential (primary) hypertension: Secondary | ICD-10-CM | POA: Insufficient documentation

## 2021-05-11 DIAGNOSIS — D696 Thrombocytopenia, unspecified: Secondary | ICD-10-CM | POA: Diagnosis not present

## 2021-05-11 DIAGNOSIS — Z89611 Acquired absence of right leg above knee: Secondary | ICD-10-CM | POA: Insufficient documentation

## 2021-05-11 DIAGNOSIS — F17200 Nicotine dependence, unspecified, uncomplicated: Secondary | ICD-10-CM | POA: Insufficient documentation

## 2021-05-11 DIAGNOSIS — D649 Anemia, unspecified: Secondary | ICD-10-CM | POA: Diagnosis not present

## 2021-05-11 DIAGNOSIS — E119 Type 2 diabetes mellitus without complications: Secondary | ICD-10-CM | POA: Diagnosis not present

## 2021-05-11 LAB — CBC WITH DIFFERENTIAL (CANCER CENTER ONLY)
Abs Immature Granulocytes: 0.23 10*3/uL — ABNORMAL HIGH (ref 0.00–0.07)
Basophils Absolute: 0 10*3/uL (ref 0.0–0.1)
Basophils Relative: 0 %
Eosinophils Absolute: 0.2 10*3/uL (ref 0.0–0.5)
Eosinophils Relative: 0 %
HCT: 33.3 % — ABNORMAL LOW (ref 39.0–52.0)
Hemoglobin: 10.3 g/dL — ABNORMAL LOW (ref 13.0–17.0)
Immature Granulocytes: 0 %
Lymphocytes Relative: 78 %
Lymphs Abs: 105.5 10*3/uL — ABNORMAL HIGH (ref 0.7–4.0)
MCH: 29.7 pg (ref 26.0–34.0)
MCHC: 30.9 g/dL (ref 30.0–36.0)
MCV: 96 fL (ref 80.0–100.0)
Monocytes Absolute: 26.2 10*3/uL — ABNORMAL HIGH (ref 0.1–1.0)
Monocytes Relative: 19 %
Neutro Abs: 3.6 10*3/uL (ref 1.7–7.7)
Neutrophils Relative %: 3 %
Platelet Count: 80 10*3/uL — ABNORMAL LOW (ref 150–400)
RBC: 3.47 MIL/uL — ABNORMAL LOW (ref 4.22–5.81)
RDW: 15.9 % — ABNORMAL HIGH (ref 11.5–15.5)
Smear Review: NORMAL
WBC Count: 135.7 10*3/uL (ref 4.0–10.5)
nRBC: 0 % (ref 0.0–0.2)

## 2021-05-11 NOTE — Progress Notes (Signed)
CRITICAL VALUE STICKER  CRITICAL VALUE:WBC 135.7  RECEIVER (on-site recipient of call):Darcell Yacoub,RN  DATE & TIME NOTIFIED: 05/11/21 @ [place] (representative from lab):Phyllis  MD NOTIFIED: Dr. Benay Spice  TIME OF NOTIFICATION:0855  RESPONSE:  Seeing patient in office. Chronically high WBC

## 2021-05-11 NOTE — Progress Notes (Signed)
  [facility] OFFICE PROGRESS NOTE   Diagnosis: Peripheral T-cell lymphoma  INTERVAL HISTORY:   Scott Garrett was discharged in the hospital 04/27/2021 after admission with an ischemic right lower extremity.  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  He is now in a rehabilitation facility.  He is here today with his daughter. He was noted to have marked lymphocytosis with diffuse lymphadenopathy while in the hospital.  He was diagnosed with a T-cell lymphoproliferative disorder, most likely peripheral T-cell lymphoma.  Scott Garrett reports feeling well.  He has no complaint.  No fever or night sweats.  Good appetite.  Objective:  Vital signs in last 24 hours:  Blood pressure 134/77, pulse 77, temperature 98 F (36.7 C), temperature source Oral, resp. rate 18, height 5\' 11"  (1.803 m), SpO2 98 %.    HEENT: No thrush Lymphatics: Soft mobile 1 cm bilateral cervical nodes, 1 to 2 cm bilateral axillary nodes, no inguinal nodes Resp: End inspiratory rhonchi at the lower posterior chest bilaterally, no respiratory distress Cardio: Regular rate and rhythm GI: No hepatosplenomegaly Vascular: No leg edema  Skin: The upper aspect of the right groin incision is open with staples in place, there is a tan drainage.  The stump wound has staples in place and is closed.   Lab Results:  Lab Results  Component Value Date   WBC 135.7 (HH) 05/11/2021   HGB 10.3 (L) 05/11/2021   HCT 33.3 (L) 05/11/2021   MCV 96.0 05/11/2021   PLT 80 (L) 05/11/2021   NEUTROABS PENDING 05/11/2021    CMP  Lab Results  Component Value Date   NA 134 (L) 04/26/2021   K 4.7 04/26/2021   CL 104 04/26/2021   CO2 25 04/26/2021   GLUCOSE 207 (H) 04/26/2021   BUN 21 04/26/2021   CREATININE 0.88 04/26/2021   CALCIUM 8.1 (L) 04/26/2021   GFRNONAA >60 04/26/2021     Medications: I have reviewed the patient's current medications.   Assessment/Plan:  1.  Leukocytosis/lymphocytosis-T-cell  lymphoproliferative disorder -04/10/2021 right inguinal lymph node biopsy-T-cell lymphoproliferative disorder most consistent with peripheral T-cell lymphoma -04/11/2021-atypical T-cell population comprises 98% of all lymphocytes -CTs 04/13/2021-enlarged bilateral axillary, iliac, pelvic sidewall, and inguinal nodes, splenomegaly 2.  Normocytic anemia 3.  Mild thrombocytopenia 4.  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  Dementia 6.  Diabetes mellitus 7.  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  Tobacco dependence 10.  Alcohol use     Disposition: Scott Garrett has been diagnosed with a T-cell lymphoproliferative disorder.  I discussed the case with pathology again today.  The staining profile is most consistent with peripheral T-cell lymphoma.  He appears asymptomatic.  The hemoglobin has improved since discharge from the hospital.  The platelet count is lower.  I discussed treatment options with Scott Garrett and his daughter.  The plan is to continue observation since he is asymptomatic.  His daughter acknowledges Scott Garrett may not be a candidate for systemic therapy given his dementia and current clinical status.  We will consider systemic therapy with CVP if he develops symptoms from the lymphoma or progressive cytopenias.  The groin wound is open today.  I recommend he see the vascular surgeon as soon as possible for wound management.  He will return for an office visit here in 3 weeks.  Betsy Coder, MD  05/11/2021  8:57 AM

## 2021-05-11 NOTE — Progress Notes (Signed)
Faxed office note/labs from today to [facility].

## 2021-05-22 ENCOUNTER — Telehealth: Payer: Self-pay

## 2021-05-22 NOTE — Telephone Encounter (Signed)
Keela from the [facility] left a VM on patient that he now has a 2 cm tunnel in his groin wound. They are requesting to pack with iodoform gauze. Discussed with PA - advised this was fine and patient should keep Wednesday appt.

## 2021-05-24 ENCOUNTER — Encounter: Payer: Self-pay | Admitting: Vascular Surgery

## 2021-05-24 ENCOUNTER — Other Ambulatory Visit: Payer: Self-pay

## 2021-05-24 ENCOUNTER — Ambulatory Visit (INDEPENDENT_AMBULATORY_CARE_PROVIDER_SITE_OTHER): Payer: Medicare (Managed Care) | Admitting: Vascular Surgery

## 2021-05-24 VITALS — BP 170/77 | HR 70 | Temp 98.3°F | Resp 20 | Ht 71.0 in | Wt 181.0 lb

## 2021-05-24 DIAGNOSIS — S31109D Unspecified open wound of abdominal wall, unspecified quadrant without penetration into peritoneal cavity, subsequent encounter: Secondary | ICD-10-CM

## 2021-05-24 NOTE — H&P (View-Only) (Signed)
     Subjective:     Patient ID: Scott Garrett, male   DOB: 03/24/1946, 75 y.o.   MRN: [id number]  HPI 75 year old man with dementia recent underwent right lower extremity attempted thrombectomy ultimately required PTFE bypass and subsequent right above-knee amputation on October 21.  He now follows up for groin wound and hematoma discovered in his nursing home by wound care.  He does have retained PTFE there.  They deny any fevers.  He is accompanied by his daughter today.  He is not on any blood thinners.   Review of Systems Drain and right groin wound    Objective:   Physical Exam Vitals:   05/24/21 1203  BP: (!) 170/77  Pulse: 70  Resp: 20  Temp: 98.3 F (36.8 C)  SpO2: 95%   Awake and alert Nonlabored respirations Right groin has opened in the center point and is packed there is obvious seropurulent drainage without any surrounding erythema Well-healing right above-knee amputation site     Dynamic mobile imaging report Ultrasound soft tissue right groin  Mixed energy and structure measuring 4.2 x 3.4 x 2.1 cm in the right groin with differential including residual fluid collection versus organized hematoma  Assessment:     75 year old male status post right lower extremity attempted thrombectomy ultimately requiring bypass and right above-knee amputation.  He now has drainage from the right groin wound with retained PTFE.    Plan:     OR on Friday for explantation of right femoral PTFE graft.  He will need to either have the artery patched with a vein or possibly primarily closed.  Staples from right above-knee amputation site can be removed at that time  Will plan to admit to hospitalist post procedure given recent hospitalization with multiple medical comorbidities.  We will need to discuss anticoagulation given extensive DVT in right lower extremity at the time of his previous procedure.  He is not currently on anticoagulation.       Ellouise Mcwhirter C. Donzetta Matters,  MD [facility] Office: [phone] Pager: [phone]

## 2021-05-24 NOTE — Progress Notes (Signed)
     Subjective:     Patient ID: Scott Garrett, male   DOB: August 25, 1945, 75 y.o.   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  He now follows up for groin wound and hematoma discovered in his nursing home by wound care.  He does have retained PTFE there.  They deny any fevers.  He is accompanied by his daughter today.  He is not on any blood thinners.   Review of Systems Drain and right groin wound    Objective:   Physical Exam Vitals:   05/24/21 1203  BP: (!) 170/77  Pulse: 70  Resp: 20  Temp: 98.3 F (36.8 C)  SpO2: 95%   Awake and alert Nonlabored respirations Right groin has opened in the center point and is packed there is obvious seropurulent drainage without any surrounding erythema Well-healing right above-knee amputation site     Dynamic mobile imaging report Ultrasound soft tissue right groin  Mixed energy and structure measuring 4.2 x 3.4 x 2.1 cm in the right groin with differential including residual fluid collection versus organized hematoma  Assessment:     75 year old male status post right lower extremity attempted thrombectomy ultimately requiring bypass and right above-knee amputation.  He now has drainage from the right groin wound with retained PTFE.    Plan:     OR on Friday for explantation of right femoral PTFE graft.  He will need to either have the artery patched with a vein or possibly primarily closed.  Staples from right above-knee amputation site can be removed at that time  Will plan to admit to hospitalist post procedure given recent hospitalization with multiple medical comorbidities.  We will need to discuss anticoagulation given extensive DVT in right lower extremity at the time of his previous procedure.  He is not currently on anticoagulation.       Scott Dini C. Donzetta Matters,  MD [facility] Office: [phone] Pager: [phone]

## 2021-05-25 ENCOUNTER — Encounter (HOSPITAL_COMMUNITY): Payer: Self-pay | Admitting: Vascular Surgery

## 2021-05-25 NOTE — Pre-Procedure Instructions (Addendum)
    [facility]  05/25/2021                  Mr. Maselli should arrive at [place] entrance  at 0700.                If there is any questions, problems call the Pre- Op desk at [phone].  >>>>>Please send patient's Medication Record with medications administrated documentation. ( this information is required prior to OR. This includes medications that may have been on hold for surgery)<<<<<   Mr. Lia is NPO after midnight. No food or liquid, even water.  Medications: THE NIGHT BEFORE SURGERY, take 5_units of Lantus insulin.   Do Not give Metformin the morning of surgery.  Check blood sugar the morning of your surgery when you wake up and every 2 hours until you get to the Short Stay unit. Do not take insulin. Treat a low blood sugar (less than 70 mg/dL) with 4 glucose tablets, OR glucose gel. Recheck blood sugar in 15 minutes after treatment (to make sure it is greater than 70 mg/dL). If your blood sugar is not greater than 70 mg/dL on recheck, call [phone]  for further instructions. Report blood sugar to the short stay nurse when you get to Short Stay.  Patient should have a shower, with antibacteria soap, the morning of surgery . Dry off with a clean towel.  Patient should not have lotions, powders, colognes, deodorant, jewelry, or piercing's. Wear clean comfortable clothes.  Brush teeth.    [facility] is not responsible for valuables or personal belongings. If Mr. Gassett wears glasses - send his glass case with him.  Patient may wear wear dentures or partials- they will not be worn in surgery, we have to remove them and will put the dentures/partials in a denture cup in patient belongings bag.  Patient should not use adhesive.  Patient should have a shower, with antibacteria soap, the morning of surgery . Dry off with a clean towel.  Patient should not have lotions, powders, colognes, deodorant, jewelry, or piercing's. Wear clean comfortable clothes.  Brush  teeth.

## 2021-05-25 NOTE — Progress Notes (Addendum)
I called [facility] and spoke with Charolene, the Therapist, sports.  I asked for the [facility] to be sent to me.  Sharolene told me the recent CBG's, which have been under 180.     I faxed [facility] instructions.  Charlene called me back, she did not have any questions.

## 2021-05-26 ENCOUNTER — Inpatient Hospital Stay (HOSPITAL_COMMUNITY): Payer: No Typology Code available for payment source | Admitting: Anesthesiology

## 2021-05-26 ENCOUNTER — Encounter (HOSPITAL_COMMUNITY)
Admission: RE | Disposition: A | Discharge status: Skilled Nursing Facility | Payer: Self-pay | Source: Home / Self Care | Attending: Internal Medicine

## 2021-05-26 ENCOUNTER — Inpatient Hospital Stay (HOSPITAL_COMMUNITY)
Admission: RE | Admit: 2021-05-26 | Discharge: 2021-06-01 | DRG: 253 | Disposition: A | Discharge status: Skilled Nursing Facility | Payer: No Typology Code available for payment source | Attending: Student | Admitting: Student

## 2021-05-26 ENCOUNTER — Encounter (HOSPITAL_COMMUNITY): Payer: Self-pay | Admitting: Vascular Surgery

## 2021-05-26 DIAGNOSIS — E11319 Type 2 diabetes mellitus with unspecified diabetic retinopathy without macular edema: Secondary | ICD-10-CM | POA: Diagnosis present

## 2021-05-26 DIAGNOSIS — D72820 Lymphocytosis (symptomatic): Secondary | ICD-10-CM | POA: Diagnosis present

## 2021-05-26 DIAGNOSIS — T8579XA Infection and inflammatory reaction due to other internal prosthetic devices, implants and grafts, initial encounter: Secondary | ICD-10-CM | POA: Diagnosis present

## 2021-05-26 DIAGNOSIS — D62 Acute posthemorrhagic anemia: Secondary | ICD-10-CM | POA: Diagnosis not present

## 2021-05-26 DIAGNOSIS — E119 Type 2 diabetes mellitus without complications: Secondary | ICD-10-CM

## 2021-05-26 DIAGNOSIS — Z20822 Contact with and (suspected) exposure to covid-19: Secondary | ICD-10-CM | POA: Diagnosis present

## 2021-05-26 DIAGNOSIS — D479 Neoplasm of uncertain behavior of lymphoid, hematopoietic and related tissue, unspecified: Secondary | ICD-10-CM | POA: Diagnosis present

## 2021-05-26 DIAGNOSIS — Z781 Physical restraint status: Secondary | ICD-10-CM

## 2021-05-26 DIAGNOSIS — C8440 Peripheral T-cell lymphoma, not classified, unspecified site: Secondary | ICD-10-CM | POA: Diagnosis present

## 2021-05-26 DIAGNOSIS — E1165 Type 2 diabetes mellitus with hyperglycemia: Secondary | ICD-10-CM | POA: Diagnosis present

## 2021-05-26 DIAGNOSIS — Z95828 Presence of other vascular implants and grafts: Secondary | ICD-10-CM

## 2021-05-26 DIAGNOSIS — T8149XA Infection following a procedure, other surgical site, initial encounter: Secondary | ICD-10-CM

## 2021-05-26 DIAGNOSIS — Z66 Do not resuscitate: Secondary | ICD-10-CM | POA: Diagnosis present

## 2021-05-26 DIAGNOSIS — Y828 Other medical devices associated with adverse incidents: Secondary | ICD-10-CM | POA: Diagnosis present

## 2021-05-26 DIAGNOSIS — T827XXA Infection and inflammatory reaction due to other cardiac and vascular devices, implants and grafts, initial encounter: Secondary | ICD-10-CM

## 2021-05-26 DIAGNOSIS — I10 Essential (primary) hypertension: Secondary | ICD-10-CM | POA: Diagnosis present

## 2021-05-26 DIAGNOSIS — S31109D Unspecified open wound of abdominal wall, unspecified quadrant without penetration into peritoneal cavity, subsequent encounter: Secondary | ICD-10-CM

## 2021-05-26 DIAGNOSIS — F0390 Unspecified dementia without behavioral disturbance: Secondary | ICD-10-CM | POA: Diagnosis present

## 2021-05-26 DIAGNOSIS — F17210 Nicotine dependence, cigarettes, uncomplicated: Secondary | ICD-10-CM | POA: Diagnosis present

## 2021-05-26 DIAGNOSIS — T8131XA Disruption of external operation (surgical) wound, not elsewhere classified, initial encounter: Secondary | ICD-10-CM | POA: Diagnosis present

## 2021-05-26 DIAGNOSIS — I70221 Atherosclerosis of native arteries of extremities with rest pain, right leg: Secondary | ICD-10-CM | POA: Diagnosis present

## 2021-05-26 DIAGNOSIS — Z79899 Other long term (current) drug therapy: Secondary | ICD-10-CM

## 2021-05-26 DIAGNOSIS — Z7901 Long term (current) use of anticoagulants: Secondary | ICD-10-CM

## 2021-05-26 DIAGNOSIS — Z89611 Acquired absence of right leg above knee: Secondary | ICD-10-CM

## 2021-05-26 DIAGNOSIS — F03918 Unspecified dementia, unspecified severity, with other behavioral disturbance: Secondary | ICD-10-CM | POA: Diagnosis present

## 2021-05-26 DIAGNOSIS — Z8719 Personal history of other diseases of the digestive system: Secondary | ICD-10-CM

## 2021-05-26 DIAGNOSIS — F03911 Unspecified dementia, unspecified severity, with agitation: Secondary | ICD-10-CM | POA: Diagnosis present

## 2021-05-26 DIAGNOSIS — D696 Thrombocytopenia, unspecified: Secondary | ICD-10-CM | POA: Diagnosis present

## 2021-05-26 DIAGNOSIS — Z794 Long term (current) use of insulin: Secondary | ICD-10-CM

## 2021-05-26 DIAGNOSIS — I82411 Acute embolism and thrombosis of right femoral vein: Secondary | ICD-10-CM | POA: Diagnosis present

## 2021-05-26 DIAGNOSIS — E1151 Type 2 diabetes mellitus with diabetic peripheral angiopathy without gangrene: Secondary | ICD-10-CM | POA: Diagnosis present

## 2021-05-26 DIAGNOSIS — E113399 Type 2 diabetes mellitus with moderate nonproliferative diabetic retinopathy without macular edema, unspecified eye: Secondary | ICD-10-CM

## 2021-05-26 DIAGNOSIS — Z7984 Long term (current) use of oral hypoglycemic drugs: Secondary | ICD-10-CM

## 2021-05-26 HISTORY — PX: REMOVAL OF GRAFT: SHX6361

## 2021-05-26 HISTORY — PX: PATCH ANGIOPLASTY: SHX6230

## 2021-05-26 HISTORY — DX: Elevated white blood cell count, unspecified: D72.829

## 2021-05-26 LAB — COMPREHENSIVE METABOLIC PANEL
ALT: 28 U/L (ref 0–44)
AST: 29 U/L (ref 15–41)
Albumin: 2.9 g/dL — ABNORMAL LOW (ref 3.5–5.0)
Alkaline Phosphatase: 110 U/L (ref 38–126)
Anion gap: 5 (ref 5–15)
BUN: 24 mg/dL — ABNORMAL HIGH (ref 8–23)
CO2: 25 mmol/L (ref 22–32)
Calcium: 8.6 mg/dL — ABNORMAL LOW (ref 8.9–10.3)
Chloride: 111 mmol/L (ref 98–111)
Creatinine, Ser: 0.81 mg/dL (ref 0.61–1.24)
GFR, Estimated: >60 mL/min (ref >60)
Glucose, Bld: 113 mg/dL — ABNORMAL HIGH (ref 70–99)
Potassium: 4.6 mmol/L (ref 3.5–5.1)
Sodium: 141 mmol/L (ref 135–145)
Total Bilirubin: 0.7 mg/dL (ref 0.3–1.2)
Total Protein: 5.6 g/dL — ABNORMAL LOW (ref 6.5–8.1)

## 2021-05-26 LAB — CBC
HCT: 34.2 % — ABNORMAL LOW (ref 39.0–52.0)
Hemoglobin: 10.7 g/dL — ABNORMAL LOW (ref 13.0–17.0)
MCH: 30.2 pg (ref 26.0–34.0)
MCHC: 31.3 g/dL (ref 30.0–36.0)
MCV: 96.6 fL (ref 80.0–100.0)
Platelets: 87 10*3/uL — ABNORMAL LOW (ref 150–400)
RBC: 3.54 MIL/uL — ABNORMAL LOW (ref 4.22–5.81)
RDW: 15.2 % (ref 11.5–15.5)
WBC: 128.3 10*3/uL (ref 4.0–10.5)
nRBC: 0 % (ref 0.0–0.2)

## 2021-05-26 LAB — APTT: aPTT: 31 seconds (ref 24–36)

## 2021-05-26 LAB — GLUCOSE, CAPILLARY
Glucose-Capillary: 90 mg/dL (ref 70–99)
Glucose-Capillary: 110 mg/dL — ABNORMAL HIGH (ref 70–99)

## 2021-05-26 LAB — PROTIME-INR
INR: 1.1 (ref 0.8–1.2)
Prothrombin Time: 14.6 seconds (ref 11.4–15.2)

## 2021-05-26 LAB — SARS CORONAVIRUS 2 BY RT PCR (HOSPITAL ORDER, PERFORMED IN ~~LOC~~ HOSPITAL LAB): SARS Coronavirus 2: NEGATIVE

## 2021-05-26 LAB — TYPE AND SCREEN
ABO/RH(D): A POS
Antibody Screen: NEGATIVE

## 2021-05-26 LAB — SURGICAL PCR SCREEN
MRSA, PCR: NEGATIVE
Staphylococcus aureus: NEGATIVE

## 2021-05-26 LAB — PREALBUMIN: Prealbumin: 19.4 mg/dL (ref 18–38)

## 2021-05-26 SURGERY — REMOVAL, GRAFT
Anesthesia: General | Site: Groin | Laterality: Right

## 2021-05-26 MED ORDER — OXYCODONE HCL 5 MG PO TABS: 5.0000 mg | ORAL_TABLET | ORAL | Status: DC | PRN

## 2021-05-26 MED ORDER — 0.9 % SODIUM CHLORIDE (POUR BTL) OPTIME
TOPICAL | Status: DC | PRN
  Administered 2021-05-26: 1000 mL

## 2021-05-26 MED ORDER — FENTANYL CITRATE (PF) 100 MCG/2ML IJ SOLN
25.0000 ug | INTRAMUSCULAR | Status: DC | PRN
  Administered 2021-05-26: 25 ug via INTRAVENOUS

## 2021-05-26 MED ORDER — HYDRALAZINE HCL 20 MG/ML IJ SOLN: 5.0000 mg | INTRAMUSCULAR | Status: DC | PRN

## 2021-05-26 MED ORDER — HEPARIN 6000 UNIT IRRIGATION SOLUTION
Status: AC
  Filled 2021-05-26: qty 500

## 2021-05-26 MED ORDER — CHLORHEXIDINE GLUCONATE CLOTH 2 % EX PADS: 6.0000 | MEDICATED_PAD | Freq: Once | CUTANEOUS | Status: DC

## 2021-05-26 MED ORDER — CHLORHEXIDINE GLUCONATE 0.12 % MT SOLN
15.0000 mL | Freq: Once | OROMUCOSAL | Status: AC
  Administered 2021-05-26: 15 mL via OROMUCOSAL
  Filled 2021-05-26: qty 15

## 2021-05-26 MED ORDER — OXYCODONE-ACETAMINOPHEN 5-325 MG PO TABS
1.0000 | ORAL_TABLET | Freq: Four times a day (QID) | ORAL | Status: DC | PRN
  Administered 2021-05-30: 1 via ORAL
  Filled 2021-05-26: qty 1

## 2021-05-26 MED ORDER — INSULIN GLARGINE-YFGN 100 UNIT/ML ~~LOC~~ SOLN
10.0000 [IU] | Freq: Every day | SUBCUTANEOUS | Status: DC
  Administered 2021-05-26 – 2021-06-01 (×7): 10 [IU] via SUBCUTANEOUS
  Filled 2021-05-26 (×8): qty 0.1

## 2021-05-26 MED ORDER — FENTANYL CITRATE (PF) 100 MCG/2ML IJ SOLN
INTRAMUSCULAR | Status: DC | PRN
  Administered 2021-05-26 (×3): 50 ug via INTRAVENOUS

## 2021-05-26 MED ORDER — ACETAMINOPHEN 325 MG PO TABS: 650.0000 mg | ORAL_TABLET | Freq: Four times a day (QID) | ORAL | Status: DC | PRN

## 2021-05-26 MED ORDER — FENTANYL CITRATE (PF) 250 MCG/5ML IJ SOLN
INTRAMUSCULAR | Status: AC
  Filled 2021-05-26: qty 5

## 2021-05-26 MED ORDER — ENOXAPARIN SODIUM 40 MG/0.4ML IJ SOSY
40.0000 mg | PREFILLED_SYRINGE | INTRAMUSCULAR | Status: DC
  Administered 2021-05-26 – 2021-05-29 (×4): 40 mg via SUBCUTANEOUS
  Filled 2021-05-26 (×4): qty 0.4

## 2021-05-26 MED ORDER — EPHEDRINE SULFATE 50 MG/ML IJ SOLN
INTRAMUSCULAR | Status: DC | PRN
  Administered 2021-05-26: 25 mg via INTRAVENOUS

## 2021-05-26 MED ORDER — LIDOCAINE 2% (20 MG/ML) 5 ML SYRINGE
INTRAMUSCULAR | Status: DC | PRN
  Administered 2021-05-26: 60 mg via INTRAVENOUS

## 2021-05-26 MED ORDER — LACTATED RINGERS IV SOLN: INTRAVENOUS | Status: DC

## 2021-05-26 MED ORDER — MORPHINE SULFATE (PF) 2 MG/ML IV SOLN: 2.0000 mg | INTRAVENOUS | Status: DC | PRN

## 2021-05-26 MED ORDER — SODIUM CHLORIDE 0.9 % IV SOLN: INTRAVENOUS | Status: DC

## 2021-05-26 MED ORDER — ACETAMINOPHEN 500 MG PO TABS
1000.0000 mg | ORAL_TABLET | Freq: Once | ORAL | Status: AC
  Administered 2021-05-26: 1000 mg via ORAL
  Filled 2021-05-26: qty 2

## 2021-05-26 MED ORDER — ONDANSETRON HCL 4 MG/2ML IJ SOLN: 4.0000 mg | Freq: Once | INTRAMUSCULAR | Status: DC | PRN

## 2021-05-26 MED ORDER — ONDANSETRON HCL 4 MG PO TABS: 4.0000 mg | ORAL_TABLET | Freq: Four times a day (QID) | ORAL | Status: DC | PRN

## 2021-05-26 MED ORDER — PROPOFOL 10 MG/ML IV BOLUS
INTRAVENOUS | Status: DC | PRN
  Administered 2021-05-26: 100 mg via INTRAVENOUS

## 2021-05-26 MED ORDER — CEFAZOLIN SODIUM-DEXTROSE 2-4 GM/100ML-% IV SOLN
2.0000 g | INTRAVENOUS | Status: AC
  Administered 2021-05-26: 2 g via INTRAVENOUS
  Filled 2021-05-26: qty 100

## 2021-05-26 MED ORDER — PHENYLEPHRINE HCL-NACL 20-0.9 MG/250ML-% IV SOLN
INTRAVENOUS | Status: DC | PRN
  Administered 2021-05-26: 50 ug/min via INTRAVENOUS

## 2021-05-26 MED ORDER — ONDANSETRON HCL 4 MG/2ML IJ SOLN: 4.0000 mg | Freq: Four times a day (QID) | INTRAMUSCULAR | Status: DC | PRN

## 2021-05-26 MED ORDER — POLYETHYLENE GLYCOL 3350 17 G PO PACK: 17.0000 g | PACK | Freq: Every day | ORAL | Status: DC | PRN

## 2021-05-26 MED ORDER — ACETAMINOPHEN 650 MG RE SUPP: 650.0000 mg | Freq: Four times a day (QID) | RECTAL | Status: DC | PRN

## 2021-05-26 MED ORDER — DEXAMETHASONE SODIUM PHOSPHATE 4 MG/ML IJ SOLN
INTRAMUSCULAR | Status: DC | PRN
  Administered 2021-05-26: 8 mg via INTRAVENOUS

## 2021-05-26 MED ORDER — CEFAZOLIN SODIUM-DEXTROSE 1-4 GM/50ML-% IV SOLN
1.0000 g | Freq: Three times a day (TID) | INTRAVENOUS | Status: AC
  Administered 2021-05-26 – 2021-05-27 (×3): 1 g via INTRAVENOUS
  Filled 2021-05-26 (×3): qty 50

## 2021-05-26 MED ORDER — FENTANYL CITRATE (PF) 100 MCG/2ML IJ SOLN
INTRAMUSCULAR | Status: AC
  Filled 2021-05-26: qty 2

## 2021-05-26 MED ORDER — MORPHINE SULFATE (PF) 2 MG/ML IV SOLN
2.0000 mg | INTRAVENOUS | Status: DC | PRN
  Administered 2021-05-31: 2 mg via INTRAVENOUS
  Filled 2021-05-26: qty 1

## 2021-05-26 MED ORDER — HEPARIN 6000 UNIT IRRIGATION SOLUTION
Status: DC | PRN
  Administered 2021-05-26: 1

## 2021-05-26 MED ORDER — HALOPERIDOL LACTATE 5 MG/ML IJ SOLN: 5.0000 mg | Freq: Four times a day (QID) | INTRAMUSCULAR | Status: DC | PRN

## 2021-05-26 MED ORDER — INSULIN ASPART 100 UNIT/ML IJ SOLN
0.0000 [IU] | Freq: Three times a day (TID) | INTRAMUSCULAR | Status: DC
  Administered 2021-05-26: 5 [IU] via SUBCUTANEOUS
  Administered 2021-05-27: 3 [IU] via SUBCUTANEOUS
  Administered 2021-05-27 – 2021-05-28 (×4): 2 [IU] via SUBCUTANEOUS
  Administered 2021-05-29 (×2): 5 [IU] via SUBCUTANEOUS
  Administered 2021-05-30: 2 [IU] via SUBCUTANEOUS
  Administered 2021-05-30 – 2021-05-31 (×4): 3 [IU] via SUBCUTANEOUS
  Administered 2021-06-01: 2 [IU] via SUBCUTANEOUS
  Administered 2021-06-01: 3 [IU] via SUBCUTANEOUS

## 2021-05-26 MED ORDER — BISACODYL 5 MG PO TBEC: 5.0000 mg | DELAYED_RELEASE_TABLET | Freq: Every day | ORAL | Status: DC | PRN

## 2021-05-26 MED ORDER — ONDANSETRON HCL 4 MG/2ML IJ SOLN
INTRAMUSCULAR | Status: DC | PRN
  Administered 2021-05-26: 4 mg via INTRAVENOUS

## 2021-05-26 MED ORDER — ORAL CARE MOUTH RINSE: 15.0000 mL | Freq: Once | OROMUCOSAL | Status: AC

## 2021-05-26 MED ORDER — DOCUSATE SODIUM 100 MG PO CAPS
100.0000 mg | ORAL_CAPSULE | Freq: Two times a day (BID) | ORAL | Status: DC
  Administered 2021-05-26 – 2021-06-01 (×11): 100 mg via ORAL
  Filled 2021-05-26 (×13): qty 1

## 2021-05-26 MED ORDER — PHENYLEPHRINE HCL (PRESSORS) 10 MG/ML IV SOLN
INTRAVENOUS | Status: DC | PRN
  Administered 2021-05-26: 120 ug via INTRAVENOUS

## 2021-05-26 MED ORDER — LISINOPRIL 5 MG PO TABS
5.0000 mg | ORAL_TABLET | Freq: Every day | ORAL | Status: DC
  Administered 2021-05-27 – 2021-06-01 (×6): 5 mg via ORAL
  Filled 2021-05-26 (×6): qty 1

## 2021-05-26 MED ORDER — DONEPEZIL HCL 10 MG PO TABS
10.0000 mg | ORAL_TABLET | Freq: Every day | ORAL | Status: DC
  Administered 2021-05-26 – 2021-06-01 (×7): 10 mg via ORAL
  Filled 2021-05-26 (×7): qty 1

## 2021-05-26 SURGICAL SUPPLY — 54 items
BAG COUNTER SPONGE SURGICOUNT (BAG) ×3 IMPLANT
BAG SURGICOUNT SPONGE COUNTING (BAG) ×1
CANISTER SUCT 3000ML PPV (MISCELLANEOUS) ×4 IMPLANT
CATH EMB 4FR 40CM (CATHETERS) ×4 IMPLANT
CLIP LIGATING EXTRA MED SLVR (CLIP) ×4 IMPLANT
CLIP LIGATING EXTRA SM BLUE (MISCELLANEOUS) ×4 IMPLANT
CLIP VESOCCLUDE SM WIDE 24/CT (CLIP) ×4 IMPLANT
DERMABOND ADVANCED (GAUZE/BANDAGES/DRESSINGS) ×2
DERMABOND ADVANCED .7 DNX12 (GAUZE/BANDAGES/DRESSINGS) ×2 IMPLANT
DRAIN CHANNEL 15F RND FF W/TCR (WOUND CARE) IMPLANT
DRAPE ORTHO SPLIT 77X108 STRL (DRAPES) ×4
DRAPE SURG ORHT 6 SPLT 77X108 (DRAPES) ×4 IMPLANT
DRSG COVADERM 4X14 (GAUZE/BANDAGES/DRESSINGS) ×4 IMPLANT
DRSG COVADERM 4X8 (GAUZE/BANDAGES/DRESSINGS) ×4 IMPLANT
ELECT REM PT RETURN 9FT ADLT (ELECTROSURGICAL) ×4
ELECTRODE REM PT RTRN 9FT ADLT (ELECTROSURGICAL) ×2 IMPLANT
EVACUATOR SILICONE 100CC (DRAIN) IMPLANT
GLOVE SURG ENC MOIS LTX SZ7.5 (GLOVE) ×4 IMPLANT
GLOVE SURG PR MICRO ENCORE 7 (GLOVE) ×8 IMPLANT
GOWN STRL REUS W/ TWL LRG LVL3 (GOWN DISPOSABLE) ×4 IMPLANT
GOWN STRL REUS W/ TWL XL LVL3 (GOWN DISPOSABLE) ×2 IMPLANT
GOWN STRL REUS W/TWL LRG LVL3 (GOWN DISPOSABLE) ×4
GOWN STRL REUS W/TWL XL LVL3 (GOWN DISPOSABLE) ×2
KIT BASIN OR (CUSTOM PROCEDURE TRAY) ×4 IMPLANT
KIT TURNOVER KIT B (KITS) ×4 IMPLANT
LOOP VESSEL MAXI BLUE (MISCELLANEOUS) ×4 IMPLANT
LOOP VESSEL MINI RED (MISCELLANEOUS) ×4 IMPLANT
NS IRRIG 1000ML POUR BTL (IV SOLUTION) ×8 IMPLANT
PACK PERIPHERAL VASCULAR (CUSTOM PROCEDURE TRAY) ×4 IMPLANT
PAD ARMBOARD 7.5X6 YLW CONV (MISCELLANEOUS) ×8 IMPLANT
SPONGE INTESTINAL PEANUT (DISPOSABLE) ×4 IMPLANT
SPONGE T-LAP 18X18 ~~LOC~~+RFID (SPONGE) ×4 IMPLANT
STAPLER VISISTAT (STAPLE) ×4 IMPLANT
STOPCOCK 4 WAY LG BORE MALE ST (IV SETS) ×4 IMPLANT
SUT ETHILON 3 0 PS 1 (SUTURE) IMPLANT
SUT MNCRL AB 4-0 PS2 18 (SUTURE) ×4 IMPLANT
SUT PROLENE 5 0 C 1 24 (SUTURE) ×8 IMPLANT
SUT PROLENE 6 0 BV (SUTURE) ×4 IMPLANT
SUT SILK 2 0 (SUTURE) ×2
SUT SILK 2-0 18XBRD TIE 12 (SUTURE) ×2 IMPLANT
SUT SILK 3 0 (SUTURE) ×2
SUT SILK 3-0 18XBRD TIE 12 (SUTURE) ×2 IMPLANT
SUT SILK 4 0 (SUTURE) ×2
SUT SILK 4-0 18XBRD TIE 12 (SUTURE) ×2 IMPLANT
SUT VIC AB 2-0 CT1 27 (SUTURE) ×2
SUT VIC AB 2-0 CT1 TAPERPNT 27 (SUTURE) ×2 IMPLANT
SUT VIC AB 3-0 SH 27 (SUTURE) ×2
SUT VIC AB 3-0 SH 27X BRD (SUTURE) ×2 IMPLANT
SYR 20ML LL LF (SYRINGE) ×4 IMPLANT
SYR 3ML LL SCALE MARK (SYRINGE) ×4 IMPLANT
TOWEL GREEN STERILE (TOWEL DISPOSABLE) ×8 IMPLANT
TOWEL GREEN STERILE FF (TOWEL DISPOSABLE) ×4 IMPLANT
UNDERPAD 30X36 HEAVY ABSORB (UNDERPADS AND DIAPERS) ×4 IMPLANT
WATER STERILE IRR 1000ML POUR (IV SOLUTION) ×4 IMPLANT

## 2021-05-26 NOTE — Anesthesia Preprocedure Evaluation (Signed)
Anesthesia Evaluation  Patient identified by MRN, date of birth, ID band Patient awake    Reviewed: Allergy & Precautions, NPO status , Patient's Chart, lab work & pertinent test results  Airway Mallampati: II  TM Distance: >3 FB Neck ROM: Full    Dental  (+) Teeth Intact, Dental Advisory Given   Pulmonary Patient abstained from smoking., former smoker,    Pulmonary exam normal breath sounds clear to auscultation       Cardiovascular hypertension, Pt. on medications + Peripheral Vascular Disease  Normal cardiovascular exam Rhythm:Regular Rate:Normal     Neuro/Psych PSYCHIATRIC DISORDERS Dementia negative neurological ROS     GI/Hepatic negative GI ROS, Neg liver ROS,   Endo/Other  diabetes, Type 2, Oral Hypoglycemic Agents, Insulin Dependent  Renal/GU negative Renal ROS     Musculoskeletal   Abdominal   Peds  Hematology   Anesthesia Other Findings   Reproductive/Obstetrics                             Anesthesia Physical Anesthesia Plan  ASA: 3  Anesthesia Plan: General   Post-op Pain Management:    Induction: Intravenous  PONV Risk Score and Plan: 2 and Dexamethasone and Ondansetron  Airway Management Planned: LMA  Additional Equipment:   Intra-op Plan:   Post-operative Plan: Extubation in OR  Informed Consent: I have reviewed the patients History and Physical, chart, labs and discussed the procedure including the risks, benefits and alternatives for the proposed anesthesia with the patient or authorized representative who has indicated his/her understanding and acceptance.     Dental advisory given  Plan Discussed with: CRNA  Anesthesia Plan Comments:         Anesthesia Quick Evaluation

## 2021-05-26 NOTE — Anesthesia Postprocedure Evaluation (Signed)
Anesthesia Post Note  Patient: Scott Garrett  Procedure(s) Performed: RIGHT GROIN EXPOLRATION AND EXPLANTATION OF RIGHT FEMORAL GRAFT ; REMOVAL OF RIGHT STUMP STAPLES (Right: Groin) PATCH ANGIOPLASTY OF RIGHT FEMORAL ARTERY USING RIGHT GREATER SAPHENOUS VEIN PATCH (Right: Groin)     Patient location during evaluation: PACU Anesthesia Type: General Level of consciousness: awake and alert Pain management: pain level controlled Vital Signs Assessment: post-procedure vital signs reviewed and stable Respiratory status: spontaneous breathing, nonlabored ventilation, respiratory function stable and patient connected to nasal cannula oxygen Cardiovascular status: blood pressure returned to baseline and stable Postop Assessment: no apparent nausea or vomiting Anesthetic complications: no   No notable events documented.  Last Vitals:  Vitals:   05/26/21 1549 05/26/21 1604  BP: (!) 144/114 134/71  Pulse: 80 90  Resp: 16 18  Temp:    SpO2: 100% 100%    Last Pain:  Vitals:   05/26/21 1434  TempSrc:   PainSc: 0-No pain                 Catalina Gravel

## 2021-05-26 NOTE — Interval H&P Note (Signed)
History and Physical Interval Note:  05/26/2021 7:28 AM  Scott Garrett  has presented today for surgery, with the diagnosis of Right groin wound.  The various methods of treatment have been discussed with the patient and family. After consideration of risks, benefits and other options for treatment, the patient has consented to  Procedure(s): Right Groin Exploration (Right) With Femoral Vein Patch (Right) as a surgical intervention.  The patient's history has been reviewed, patient examined, no change in status, stable for surgery.  I have reviewed the patient's chart and labs.  Questions were answered to the patient's satisfaction.     [name]

## 2021-05-26 NOTE — Transfer of Care (Signed)
Immediate Anesthesia Transfer of Care Note  Patient: Scott Garrett  Procedure(s) Performed: RIGHT GROIN EXPOLRATION AND REMOVAL OF FEMORAL GRAFT (Right: Groin) PATCH ANGIOPLASTY OF RIGHT FEMORAL ARTERY USING RIGHT GREATER SAPHENOUS VEIN PATCH (Right: Groin)  Patient Location: PACU  Anesthesia Type:General  Level of Consciousness: awake, alert  and oriented  Airway & Oxygen Therapy: Patient Spontanous Breathing  Post-op Assessment: Report given to RN and Post -op Vital signs reviewed and stable  Post vital signs: Reviewed and stable  Last Vitals:  Vitals Value Taken Time  BP 133/75 05/26/21 1118  Temp    Pulse 75 05/26/21 1119  Resp 13 05/26/21 1119  SpO2 96 % 05/26/21 1119  Vitals shown include unvalidated device data.  Last Pain:  Vitals:   05/26/21 0713  TempSrc: Oral         Complications: No notable events documented.

## 2021-05-26 NOTE — Op Note (Signed)
Patient name: Scott Garrett MRN: [id number] DOB: [birth year] Sex: male  05/26/2021 Pre-operative Diagnosis: infected right femoral bypass graft Post-operative diagnosis:  Same Surgeon:  Erlene Quan C. Donzetta Matters, MD Assistant: Leontine Locket, PA Procedure Performed: 1.  Excision of right femoral bypass graft (partial) 2.  Harvest right greater saphenous vein 3.  Right common femoral artery vein patch angioplasty  Indications: 75 year old male with history of ischemic right lower extremity initially had femoral to peroneal artery bypass grafting with PTFE.  He subsequently required an above-knee amputation.  He now has drainage from his right groin with concern for infection of the graft and he is indicated for explantation.  An assistant was necessary to facilitate exposure and expedite the case.  Findings: The graft had partially dehisced at the level of the anastomosis.  We removed all of the graft around the anastomosis down to where it was incorporated but I could not remove the entirety of the graft and there is certainly some remaining in the thigh but it is not visible and is fully incorporated.  There was a large arteriotomy I attempted harvest superficial femoral artery but this was not viable for patch angioplasty.  I ultimately harvested the greater saphenous vein which had subacute appearing thrombus in it.  It was ligated centrally with running 5-0 Prolene suture and distally with 2-0 silk suture tie.  I placed the patch angioplasty on the common femoral artery using the saphenous vein and and at completion there was a signal distally.  Staples were removed from the right above-knee amputation site.   Procedure:  The patient was identified in the holding area and taken to the operating was put supine operative table and general anesthesia induced.  He was sterilely prepped draped in the right groin in the above-knee amputation site in the usual fashion, antibiotics were administered timeout  was called.  We began by removing staples in the above-knee amputation site.  This was all appeared satisfactory.  We then remove staples in the right groin.  We opened the previous incision.  The graft was identifiable but the previous anastomosis was not.  As we began dissecting this out the graft became dehisced and there was acute blood loss.  Pressure was held we dissected proximally to the external neck artery clamp this.  I ultimately flushed both directions with heparinized saline.  Distally I placed a 4 Fogarty with a stopcock.  We continued dissecting out the common femoral artery down to the profunda.  I was able to get a clamp on the profunda artery there was no backbleeding and the Fogarty catheter was removed.  The graft was then removed.  I attempted to remove the distal part of the graft but I was only able to get down where it was fully incorporated as far as I can see and I transected it there was scissors and there is certainly some graft still distally in the thigh.  I then flushed in both directions with heparinized saline again.  I attempted to dissected out the superficial femoral artery but this was chronically occluded.  I then dissected medially to the greater saphenous vein this was identified and did appear thrombosed.  Distally I tied it off with 2-0 silk suture proximally and ligated with 5-0 Prolene suture.  I harvested this and use this as a patch where the previous graft had been.  This was performed with 5-0 Prolene suture.  Prior completion we then flushed with heparinized saline and in all directions.  Upon  completion there was good signal distally in the profunda artery.  There was good hemostasis.  We thoroughly irrigated the wound.  We obtain pneumostasis in the wound.  We closed the wound with interrupted 2-0 and 3-0 Vicryl suture and then staples at the level of the skin.  He was awakened from anesthesia having tolerated procedure well without immediate complication.  All  counts were correct at completion.  EBL: 100 cc  Jesyca Weisenburger C. Donzetta Matters, MD [facility] Office: [phone] Pager: [phone]

## 2021-05-26 NOTE — H&P (Signed)
History and Physical    Scott Garrett QHU:[id number] DOB: 03-17-1946 DOA: 05/26/2021  PCP: [name] Consultants:  Donzetta Matters - vascular surgery; Benay Spice - oncology Patient coming from: [facility]; NOK: Daughter, Scott Garrett, [phone]  Chief Complaint: Wound infection  HPI: Scott Garrett is a 75 y.o. male with medical history significant of dementia; DM; HTN; T cell lymphoproliferative d/o; and PVD s/p AKA presenting with groin wound infection. He was previously admitted from 10/17-11/3 with RLE ischemia.  He underwent attempted thrombectomy and ultimately required bypass and R AKA.  His R groin surgical wound is open and he was seen in the clinic on 11/30 with US showing residual fluid collection vs. Organized hematoma and so he was arranged to go to the OR today for wound exploration.  He was seen in PACU and did not report pain.  He is upset about people "lying" to him and is currently hungry.  He voices disappointment in "waking up here" and when asked where he would have preferred to wake up he reported "in hell."    I spoke with his daughter.  He is generally doing well, sometimes refuses therapy.  Last time, he was quite agitated and she is hoping he does better during the hospitalization - he pulled out lines, IVs, agitation, required restraints.  He is progressing slowly.  She is trying to find a permanent place for him to live.    ED Course: Discharged from our service a month ago after BKA.  Here now with ?groin infection, graft revision.  He doesn't need antibiotics.    Review of Systems: As per HPI; otherwise review of systems reviewed and negative.   Ambulatory Status:  s/p recent AKA  COVID Vaccine Status:  At least 1 shot  Past Medical History:  Diagnosis Date   Colon polyps    Dementia ([place])    Diabetic retinopathy ([place])    DM2 (diabetes mellitus, type 2) ([place])    HTN (hypertension)    Leucocytosis     Past Surgical History:   Procedure Laterality Date   AMPUTATION Right 04/14/2021   Procedure: RIGHT ABOVE KNEE AMPUTATION;  Surgeon: Angelia Mould, MD;  Location: [place];  Service: Vascular;  Laterality: Right;   COLONOSCOPY     FASCIOTOMY Right 04/10/2021   Procedure: FASCIOTOMY RIGHT LOWER EXTREMITY;  Surgeon: Waynetta Sandy, MD;  Location: [place];  Service: Vascular;  Laterality: Right;   FEMORAL-POPLITEAL BYPASS GRAFT Right 04/10/2021   Procedure: BYPASS GRAFT RIGHT FEMORAL-POPLITEAL ARTERY USING PROPATEN GRAFT;  Surgeon: Waynetta Sandy, MD;  Location: [place];  Service: Vascular;  Laterality: Right;   HERNIA REPAIR     groin, unsure of side   THROMBECTOMY FEMORAL ARTERY Right 04/10/2021   Procedure: THROMBECTOMY RIGHT FEMORAL ARTERY, THROMBECTOMY RIGHT LOWER EXTREMITY;  Surgeon: Waynetta Sandy, MD;  [facility];  Service: Vascular;  Laterality: Right;    Social History   Socioeconomic History   Marital status: Single    Spouse name: Not on file   Number of children: Not on file   Years of education: Not on file   Highest education level: Not on file  Occupational History   Not on file  Tobacco Use   Smoking status: Former    Types: Cigarettes    Quit date: 04/21/2021    Years since quitting: 0.0   Smokeless tobacco: Never   Tobacco comments:    Patient cannot smoke outside [facility]- he has to be able  to get in a w/c and take himself outside.  Vaping Use   Vaping Use: Never used  Substance and Sexual Activity   Alcohol use: Not Currently   Drug use: Not Currently   Sexual activity: Not on file  Other Topics Concern   Not on file  Social History Narrative   Not on file   Social Determinants of Health   Financial Resource Strain: Not on file  Food Insecurity: Not on file  Transportation Needs: Not on file  Physical Activity: Not on file  Stress: Not on file  Social Connections: Not on file  Intimate Partner Violence: Not on file     No Known Allergies  Family History  Family history unknown: Yes    Prior to Admission medications   Medication Sig Start Date End Date Taking? Authorizing Provider  Cholecalciferol 25 MCG (1000 UT) TBDP Take 1,000 Units by mouth in the morning.   Yes [provider]  donepezil (ARICEPT) 10 MG tablet Take 10 mg by mouth at bedtime.   Yes [provider]  insulin glargine (LANTUS) 100 UNIT/ML injection Inject 10 Units into the skin at bedtime.   Yes [provider]  insulin lispro (HUMALOG) 100 UNIT/ML KwikPen Inject 0-10 Units into the skin with breakfast, with lunch, and with evening meal. Sliding scale 0-150= 0 units,151-199= 2 units, 200-249= 4 units,250-299 =6 units, 300-349= 8 units, 350-399 =10 units, BS over 400 call MD   Yes [provider]  lisinopril (ZESTRIL) 5 MG tablet Take 5 mg by mouth daily.   Yes [provider]  metFORMIN (GLUCOPHAGE) 500 MG tablet Take 500 mg by mouth 2 (two) times daily with a meal.   Yes [provider]  Pollen Extracts (PROSTAT PO) Take 30 mLs by mouth 3 (three) times daily.   Yes [provider]    Physical Exam: Vitals:   05/26/21 1300 05/26/21 1315 05/26/21 1322 05/26/21 1334  BP:  132/64  124/69  Pulse: 69 64 69 62  Resp: 15 14 15 16   Temp:      TempSrc:      SpO2: 97% 98% 96% 98%  Weight:      Height:         General:  Appears calm and comfortable and is in NAD Eyes:   EOMI, normal lids, iris ENT:  grossly normal hearing, lips & tongue, mmm; edentulous Neck:  no LAD, masses or thyromegaly Cardiovascular:  RRR, no m/r/g. No LE edema.  Respiratory:   CTA bilaterally with no wheezes/rales/rhonchi.  Normal respiratory effort. Abdomen:  soft, NT, ND Skin:  no rash or induration seen on limited exam; dressings in place along R groin and R AKA stump and both are C/D/I Musculoskeletal:  grossly normal tone BUE/BLE, no bony abnormality, s/p R AKA Psychiatric:  cantankerous  mood and affect, speech fluent and appropriate, AOx3 Neurologic:  CN 2-12 grossly intact, moves all extremities in coordinated fashion    Radiological Exams on Admission: Independently reviewed - see discussion in A/P where applicable  No results found.  EKG: not done   Labs on Admission: I have personally reviewed the available labs and imaging studies at the time of the admission.  Pertinent labs:   BUN 24/Creatinine 0.81/GFR >60 Albumin 2.9 WBC 128.3 Hgb 10.7 Platelets 87 INR 1.1 MRSA negative   Assessment/Plan Principal Problem:   Wound infection after surgery Active Problems:   Critical limb ischemia of right lower extremity (HCC)   Dementia (HCC)   DM2 (  diabetes mellitus, type 2) ([place])   HTN (hypertension)   Lymphoproliferative disease ([place])   Groin infection -Concern for R femoral bypass graft infection and so it was partially removed -According to the surgical PA, the patient received peri-operative antibiotics but does not need ongoing abx  PVD with R AKA -s/p recent AKA -He was discharged to SNF -Staples were removed today in the OR  T-cell lymphoma -Last saw Dr. Benay Spice on 11/17 -Plan is for observation/monitoring until he becomes symptomatic -He may not be a candidate for systemic therapy given his condition and clinical status, regardless  DVT -Extensive RLE DVT at the time of previous procedure -Patient is likely to benefit from [facility] once he has stabilized from a surgical standpoint  Dementia -Continue Aricept -Delirium precautions -Telesitter -prn Haldol -His daughter is concerned about depression post-AKA  DM -Recent A1c was 11.5 -hold Glucophage -Continue Lantus -Cover with moderate-scale SSI   HTN -Continue Lisinopril     Note: This patient has been tested and is negative for the novel coronavirus COVID-19. The patient has been at least partially vaccinated against COVID-19.   Level of care: Med-Surg DVT prophylaxis:   Lovenox  Code Status:  DNR - confirmed with patient but his daughter changed this to full code Family Communication: None present; I spoke with the patient's daughter by telephone at the time of admission. Disposition Plan:  The patient is from: SNF  Anticipated d/c is to: SNF  Anticipated d/c date will depend on clinical response to treatment, but possibly as early as tomorrow if he has excellent response to treatment  Patient is currently: acutely ill Consults called: Vascular surgery; DM coordinator, peripheral vascular navigator, nutrition, TOC team  Admission status:  It is my clinical opinion that referral for OBSERVATION is reasonable and necessary in this patient based on the above information provided. The aforementioned taken together are felt to place the patient at high risk for further clinical deterioration. However it is anticipated that the patient may be medically stable for discharge from the hospital within 24 to 48 hours.    Karmen Bongo MD [facility]   How to contact the [facility] Attending or Consulting provider [place] or covering provider during after hours [place], for this patient?  Check the care team in [facility] and look for a) attending/consulting TRH provider listed and b) the [facility] team listed Log into [email or web address] and use [place]'s universal password to access. If you do not have the password, please contact the hospital operator. Locate the [facility] provider you are looking for under [facility] and page to a number that you can be directly reached. If you still have difficulty reaching the provider, please page the [facility] (Director on Call) for the Hospitalists listed on amion for assistance.   05/26/2021, 1:41 PM

## 2021-05-26 NOTE — Anesthesia Procedure Notes (Signed)
Procedure Name: LMA Insertion Date/Time: 05/26/2021 9:55 AM Performed by: Minerva Ends, CRNA Pre-anesthesia Checklist: Patient identified, Emergency Drugs available, Suction available and Patient being monitored Patient Re-evaluated:Patient Re-evaluated prior to induction Oxygen Delivery Method: Circle system utilized Preoxygenation: Pre-oxygenation with 100% oxygen Induction Type: IV induction Ventilation: Mask ventilation without difficulty LMA: LMA inserted LMA Size: 4.0 Tube type: Oral Number of attempts: 1 Placement Confirmation: positive ETCO2 and breath sounds checked- equal and bilateral Tube secured with: Tape Dental Injury: Teeth and Oropharynx as per pre-operative assessment

## 2021-05-27 ENCOUNTER — Encounter (HOSPITAL_COMMUNITY): Payer: Self-pay | Admitting: Vascular Surgery

## 2021-05-27 DIAGNOSIS — E1165 Type 2 diabetes mellitus with hyperglycemia: Secondary | ICD-10-CM | POA: Diagnosis present

## 2021-05-27 DIAGNOSIS — R609 Edema, unspecified: Secondary | ICD-10-CM | POA: Diagnosis not present

## 2021-05-27 DIAGNOSIS — C8440 Peripheral T-cell lymphoma, not classified, unspecified site: Secondary | ICD-10-CM | POA: Diagnosis present

## 2021-05-27 DIAGNOSIS — D62 Acute posthemorrhagic anemia: Secondary | ICD-10-CM | POA: Diagnosis not present

## 2021-05-27 DIAGNOSIS — Z66 Do not resuscitate: Secondary | ICD-10-CM | POA: Diagnosis present

## 2021-05-27 DIAGNOSIS — F03918 Unspecified dementia, unspecified severity, with other behavioral disturbance: Secondary | ICD-10-CM | POA: Diagnosis present

## 2021-05-27 DIAGNOSIS — F03911 Unspecified dementia, unspecified severity, with agitation: Secondary | ICD-10-CM | POA: Diagnosis present

## 2021-05-27 DIAGNOSIS — T827XXA Infection and inflammatory reaction due to other cardiac and vascular devices, implants and grafts, initial encounter: Secondary | ICD-10-CM | POA: Diagnosis present

## 2021-05-27 DIAGNOSIS — T8131XA Disruption of external operation (surgical) wound, not elsewhere classified, initial encounter: Secondary | ICD-10-CM | POA: Diagnosis present

## 2021-05-27 DIAGNOSIS — T8579XA Infection and inflammatory reaction due to other internal prosthetic devices, implants and grafts, initial encounter: Secondary | ICD-10-CM | POA: Diagnosis present

## 2021-05-27 DIAGNOSIS — Z794 Long term (current) use of insulin: Secondary | ICD-10-CM | POA: Diagnosis not present

## 2021-05-27 DIAGNOSIS — D696 Thrombocytopenia, unspecified: Secondary | ICD-10-CM | POA: Diagnosis present

## 2021-05-27 DIAGNOSIS — F17210 Nicotine dependence, cigarettes, uncomplicated: Secondary | ICD-10-CM | POA: Diagnosis present

## 2021-05-27 DIAGNOSIS — I70221 Atherosclerosis of native arteries of extremities with rest pain, right leg: Secondary | ICD-10-CM | POA: Diagnosis present

## 2021-05-27 DIAGNOSIS — Z89611 Acquired absence of right leg above knee: Secondary | ICD-10-CM | POA: Diagnosis not present

## 2021-05-27 DIAGNOSIS — Y828 Other medical devices associated with adverse incidents: Secondary | ICD-10-CM | POA: Diagnosis present

## 2021-05-27 DIAGNOSIS — C9150 Adult T-cell lymphoma/leukemia (HTLV-1-associated) not having achieved remission: Secondary | ICD-10-CM | POA: Diagnosis not present

## 2021-05-27 DIAGNOSIS — T8149XA Infection following a procedure, other surgical site, initial encounter: Secondary | ICD-10-CM | POA: Diagnosis present

## 2021-05-27 DIAGNOSIS — E1151 Type 2 diabetes mellitus with diabetic peripheral angiopathy without gangrene: Secondary | ICD-10-CM | POA: Diagnosis present

## 2021-05-27 DIAGNOSIS — E113399 Type 2 diabetes mellitus with moderate nonproliferative diabetic retinopathy without macular edema, unspecified eye: Secondary | ICD-10-CM | POA: Diagnosis not present

## 2021-05-27 DIAGNOSIS — Z781 Physical restraint status: Secondary | ICD-10-CM | POA: Diagnosis not present

## 2021-05-27 DIAGNOSIS — Z7901 Long term (current) use of anticoagulants: Secondary | ICD-10-CM | POA: Diagnosis not present

## 2021-05-27 DIAGNOSIS — Z8719 Personal history of other diseases of the digestive system: Secondary | ICD-10-CM | POA: Diagnosis not present

## 2021-05-27 DIAGNOSIS — I82411 Acute embolism and thrombosis of right femoral vein: Secondary | ICD-10-CM | POA: Diagnosis present

## 2021-05-27 DIAGNOSIS — Z20822 Contact with and (suspected) exposure to covid-19: Secondary | ICD-10-CM | POA: Diagnosis present

## 2021-05-27 DIAGNOSIS — D72820 Lymphocytosis (symptomatic): Secondary | ICD-10-CM | POA: Diagnosis present

## 2021-05-27 DIAGNOSIS — Z95828 Presence of other vascular implants and grafts: Secondary | ICD-10-CM | POA: Diagnosis not present

## 2021-05-27 DIAGNOSIS — I10 Essential (primary) hypertension: Secondary | ICD-10-CM | POA: Diagnosis present

## 2021-05-27 DIAGNOSIS — E11319 Type 2 diabetes mellitus with unspecified diabetic retinopathy without macular edema: Secondary | ICD-10-CM | POA: Diagnosis present

## 2021-05-27 DIAGNOSIS — Z7984 Long term (current) use of oral hypoglycemic drugs: Secondary | ICD-10-CM | POA: Diagnosis not present

## 2021-05-27 LAB — CBC
HCT: 27.6 % — ABNORMAL LOW (ref 39.0–52.0)
Hemoglobin: 8.6 g/dL — ABNORMAL LOW (ref 13.0–17.0)
MCH: 29.5 pg (ref 26.0–34.0)
MCHC: 31.2 g/dL (ref 30.0–36.0)
MCV: 94.5 fL (ref 80.0–100.0)
Platelets: 87 10*3/uL — ABNORMAL LOW (ref 150–400)
RBC: 2.92 MIL/uL — ABNORMAL LOW (ref 4.22–5.81)
RDW: 15.2 % (ref 11.5–15.5)
WBC: 151.6 10*3/uL (ref 4.0–10.5)
nRBC: 0 % (ref 0.0–0.2)

## 2021-05-27 LAB — BASIC METABOLIC PANEL
Anion gap: 6 (ref 5–15)
BUN: 23 mg/dL (ref 8–23)
CO2: 23 mmol/L (ref 22–32)
Calcium: 8.3 mg/dL — ABNORMAL LOW (ref 8.9–10.3)
Chloride: 106 mmol/L (ref 98–111)
Creatinine, Ser: 0.97 mg/dL (ref 0.61–1.24)
GFR, Estimated: >60 mL/min (ref >60)
Glucose, Bld: 234 mg/dL — ABNORMAL HIGH (ref 70–99)
Potassium: 4.7 mmol/L (ref 3.5–5.1)
Sodium: 135 mmol/L (ref 135–145)

## 2021-05-27 LAB — GLUCOSE, CAPILLARY
Glucose-Capillary: 134 mg/dL — ABNORMAL HIGH (ref 70–99)
Glucose-Capillary: 112 mg/dL — ABNORMAL HIGH (ref 70–99)
Glucose-Capillary: 157 mg/dL — ABNORMAL HIGH (ref 70–99)
Glucose-Capillary: 159 mg/dL — ABNORMAL HIGH (ref 70–99)

## 2021-05-27 MED ORDER — CEFAZOLIN SODIUM-DEXTROSE 2-4 GM/100ML-% IV SOLN
2.0000 g | Freq: Three times a day (TID) | INTRAVENOUS | Status: DC
  Filled 2021-05-27 (×2): qty 100

## 2021-05-27 MED ORDER — CEFAZOLIN SODIUM-DEXTROSE 2-4 GM/100ML-% IV SOLN
2.0000 g | Freq: Three times a day (TID) | INTRAVENOUS | Status: DC
  Administered 2021-05-27 – 2021-05-31 (×12): 2 g via INTRAVENOUS
  Filled 2021-05-27 (×15): qty 100

## 2021-05-27 MED ORDER — METRONIDAZOLE 500 MG/100ML IV SOLN
500.0000 mg | Freq: Two times a day (BID) | INTRAVENOUS | Status: DC
  Administered 2021-05-27 – 2021-05-28 (×3): 500 mg via INTRAVENOUS
  Filled 2021-05-27 (×3): qty 100

## 2021-05-27 NOTE — Progress Notes (Signed)
PROGRESS NOTE    Scott Garrett  MHD:[id number] DOB: 1946/04/29 DOA: 05/26/2021 [facility]CP: [name]   Brief Narrative: 75 year old with past medical history significant for dementia, diabetes, hypertension, T-cell lymphoproliferative disorder, PVD status post AKA presents with groin wound infection.  Previously admitted 10/17 until 11/3 with right lower extremity ischemia.  He underwent attempted thrombectomy and multiple he required bypass and right AKA.  Right groin surgical wound is open and he was seen in the clinic 11/30 with ultrasound showing residual fluid collection versus organized hematoma, patient was arranged to go to the OR 12/2 for wound exploration.   Assessment & Plan:   Principal Problem:   Wound infection after surgery Active Problems:   Critical limb ischemia of right lower extremity (HCC)   Dementia (HCC)   DM2 (diabetes mellitus, type 2) ([place])   HTN (hypertension)   Lymphoproliferative disease ([place])   1-Infected right femoral bypass graft; Groin wound infection: -Concern for right femoral bypass graft infection and so it was partially removed. -Underwent excision of right femoral bypass graft partial.  -will resume IV antibiotics due to leukocytosis. Will get differential tomorrow.   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 WBC at 150-- one Month ago: 116---144 Will get differential tomorrow.  Monitor platelet and Hb.   DVT: Right lower extremity: Resume anticoagulation when okay by vascular  Dementia: Continue with Aricept, delirium precaution. As needed Haldol.  Diabetes type 2: A1c 11 Continue with Lantus Sliding scale insulin  Hypertension: On lisinopril.        Estimated body mass index is 25.24 kg/m as calculated from the following:   Height as of this encounter: 5\' 11"  (1.803 m).   Weight as of  this encounter: 82.1 kg.   DVT prophylaxis: Lovenox Code Status: Full code Family Communication: no family at bedside Disposition Plan:  Status is: Observation  The patient will require care spanning > 2 midnights and should be moved to inpatient because: infected femoral bypass, leukocytosis.        Consultants:  Vascular  Procedures:    Antimicrobials:    Subjective: He is alert, pleasantly confuse, great humor.   Objective: Vitals:   05/26/21 1738 05/26/21 1938 05/27/21 0015 05/27/21 0452  BP: (!) 142/72 138/66 (!) 147/87 (!) 149/67  Pulse: 89 96 76 75  Resp: 20 18 18 18   Temp: 98.5 F (36.9 C) 98.3 F (36.8 C) 97.6 F (36.4 C) 98.3 F (36.8 C)  TempSrc: Oral Axillary Axillary Oral  SpO2: 100% 100% 100% 100%  Weight:      Height:        Intake/Output Summary (Last 24 hours) at 05/27/2021 0730 Last data filed at 05/27/2021 0300 Gross per 24 hour  Intake 470 ml  Output 290 ml  Net 180 ml   Filed Weights   05/26/21 0713  Weight: 82.1 kg    Examination:  General exam: Appears calm and comfortable  Respiratory system: Clear to auscultation. Respiratory effort normal. Cardiovascular system: S1 & S2 heard, RRR.  Gastrointestinal system: Abdomen is nondistended, soft and nontender. No organomegaly or masses felt. Normal bowel sounds heard. Central nervous system: Alert and oriented. Extremities: Right AKA, groin cover.    Data Reviewed: I have personally reviewed following labs and imaging studies  CBC: Recent Labs  Lab 05/26/21 0917 05/27/21 0318  WBC 128.3* 151.6*  HGB 10.7* 8.6*  HCT 34.2* 27.6*  MCV 96.6 94.5  PLT 87* 87*   Basic Metabolic Panel: Recent Labs  Lab 05/26/21 0917 05/27/21 0318  NA 141 135  K 4.6 4.7  CL 111 106  CO2 25 23  GLUCOSE 113* 234*  BUN 24* 23  CREATININE 0.81 0.97  CALCIUM 8.6* 8.3*   GFR: Estimated Creatinine Clearance: 71.2 mL/min (by C-G formula based on SCr of 0.97 mg/dL). Liver Function  Tests: Recent Labs  Lab 05/26/21 0917  AST 29  ALT 28  ALKPHOS 110  BILITOT 0.7  PROT 5.6*  ALBUMIN 2.9*   No results for input(s): LIPASE, AMYLASE in the last 168 hours. No results for input(s): AMMONIA in the last 168 hours. Coagulation Profile: Recent Labs  Lab 05/26/21 0758 05/26/21 0918  INR DISREGARD RESULTS.  SPECIMEN IS HEMOLYZED.  NOTIFIED HALEY BOWMAN,RN AT 0849 05/26/2021 BY ZBEECH. 1.1   Cardiac Enzymes: No results for input(s): CKTOTAL, CKMB, CKMBINDEX, TROPONINI in the last 168 hours. BNP (last 3 results) No results for input(s): PROBNP in the last 8760 hours. HbA1C: No results for input(s): HGBA1C in the last 72 hours. CBG: Recent Labs  Lab 05/26/21 0714 05/26/21 1119  GLUCAP 90 110*   Lipid Profile: No results for input(s): CHOL, HDL, LDLCALC, TRIG, CHOLHDL, LDLDIRECT in the last 72 hours. Thyroid Function Tests: No results for input(s): TSH, T4TOTAL, FREET4, T3FREE, THYROIDAB in the last 72 hours. Anemia Panel: No results for input(s): VITAMINB12, FOLATE, FERRITIN, TIBC, IRON, RETICCTPCT in the last 72 hours. Sepsis Labs: No results for input(s): PROCALCITON, LATICACIDVEN in the last 168 hours.  Recent Results (from the past 240 hour(s))  SARS Coronavirus 2 by RT PCR (hospital order, performed in [facility] hospital lab) Nasopharyngeal Nasopharyngeal Swab     Status: None   Collection Time: 05/26/21  6:53 AM   Specimen: Nasopharyngeal Swab  Result Value Ref Range Status   SARS Coronavirus 2 NEGATIVE NEGATIVE Final    Comment: (NOTE) SARS-CoV-2 target nucleic acids are NOT DETECTED.  The SARS-CoV-2 RNA is generally detectable in upper and lower respiratory specimens during the acute phase of infection. The lowest concentration of SARS-CoV-2 viral copies this assay can detect is 250 copies / mL. A negative result does not preclude SARS-CoV-2 infection and should not be used as the sole basis for treatment or other patient management decisions.  A  negative result may occur with improper specimen collection / handling, submission of specimen other than nasopharyngeal swab, presence of viral mutation(s) within the areas targeted by this assay, and inadequate number of viral copies (<250 copies / mL). A negative result must be combined with clinical observations, patient history, and epidemiological information.  Fact Sheet for Patients:   StrictlyIdeas.no  Fact Sheet for Healthcare Providers: BankingDealers.co.za  This test is not yet approved or  cleared by the [place] FDA and has been authorized for detection and/or diagnosis of SARS-CoV-2 by FDA under an Emergency Use Authorization (EUA).  This EUA will remain in effect (meaning this test can be used) for the duration of the COVID-19 declaration under Section 564(b)(1) of the Act, 21 U.S.C. section 360bbb-3(b)(1), unless the authorization is terminated or revoked sooner.  Performed at [facility], [place] [address] [place], [place] [address]   Surgical pcr screen     Status: None   Collection Time: 05/26/21  8:32 AM   Specimen: Nasal Mucosa; Nasal Swab  Result Value Ref Range Status   MRSA, PCR NEGATIVE NEGATIVE Final   Staphylococcus aureus NEGATIVE NEGATIVE Final  Comment: (NOTE) The Xpert SA Assay (FDA approved for NASAL specimens in patients 29 years of age and older), is one component of a comprehensive surveillance program. It is not intended to diagnose infection nor to guide or monitor treatment. Performed at [facility], [place] [address] [place],  [address]          Radiology Studies: No results found.      Scheduled Meds:  docusate sodium  100 mg Oral BID   donepezil  10 mg Oral QHS   enoxaparin (LOVENOX) injection  40 mg Subcutaneous Q24H   insulin aspart  0-15 Units Subcutaneous TID WC   insulin glargine-yfgn  10 Units Subcutaneous QHS   lisinopril  5 mg Oral Daily    Continuous Infusions:  sodium chloride 75 mL/hr at 05/26/21 1818     LOS: 1 day    Time spent: 35 minutes.     Elmarie Shiley, MD [facility]   If 7PM-7AM, please contact night-coverage [email or web address]  05/27/2021, 7:30 AM

## 2021-05-27 NOTE — Progress Notes (Addendum)
   VASCULAR SURGERY ASSESSMENT & PLAN:   POD 1 S/P EXCISION OF RIGHT FEMORAL BYPASS GRAFT AND PATCH ANGIOPLASTY OF THE RIGHT CFA USING RIGHT GSV: He has some oozing at the incision which appears to be just subcutaneous using and no significant bleeding.  I tried to apply a pressure dressing on this.  We will have the nurses change the dressing as needed.   SUBJECTIVE:   No complaints this morning.  PHYSICAL EXAM:   Vitals:   05/26/21 1738 05/26/21 1938 05/27/21 0015 05/27/21 0452  BP: (!) 142/72 138/66 (!) 147/87 (!) 149/67  Pulse: 89 96 76 75  Resp: 20 18 18 18   Temp: 98.5 F (36.9 C) 98.3 F (36.8 C) 97.6 F (36.4 C) 98.3 F (36.8 C)  TempSrc: Oral Axillary Axillary Oral  SpO2: 100% 100% 100% 100%  Weight:      Height:       He has some oozing from the superior aspect of his right groin incision.  There is no significant hematoma. His right AKA looks fine.  LABS:   Lab Results  Component Value Date   WBC 151.6 (HH) 05/27/2021   HGB 8.6 (L) 05/27/2021   HCT 27.6 (L) 05/27/2021   MCV 94.5 05/27/2021   PLT 87 (L) 05/27/2021   Lab Results  Component Value Date   CREATININE 0.97 05/27/2021   Lab Results  Component Value Date   INR 1.1 05/26/2021   CBG (last 3)  Recent Labs    05/26/21 0714 05/26/21 1119 05/27/21 0842  GLUCAP 90 110* 134*    PROBLEM LIST:    Principal Problem:   Wound infection after surgery Active Problems:   Critical limb ischemia of right lower extremity (HCC)   Dementia (HCC)   DM2 (diabetes mellitus, type 2) ([place])   HTN (hypertension)   Lymphoproliferative disease (HCC)   CURRENT MEDS:    docusate sodium  100 mg Oral BID   donepezil  10 mg Oral QHS   enoxaparin (LOVENOX) injection  40 mg Subcutaneous Q24H   insulin aspart  0-15 Units Subcutaneous TID WC   insulin glargine-yfgn  10 Units Subcutaneous QHS   lisinopril  5 mg Oral Daily    Deitra Mayo Office: [phone] 05/27/2021

## 2021-05-27 NOTE — Progress Notes (Addendum)
Pharmacy Antibiotic Note  Scott Garrett is a 75 y.o. male admitted on 05/26/2021 with wound infection of right groin.  Pharmacy has been consulted for cefazolin dosing.  Scott Garrett is 75 year old male admitted with a right groin infection. He recently underwent right lower extremity attempted thrombectomy ultimately required PTFE bypass and subsequent right above-knee amputation on October 21. Now with groin wound and hematoma. S/p OR in 12/2 for infected bypass graft. WBC > 100, afebrile  Plan: Start Cefazolin 2g IV q8h (CrCl 71.2 ml/min) Follow up micro data, clinical status, LOT  Height: 5\' 11"  (180.3 cm) Weight: 82.1 kg (181 lb) IBW/kg (Calculated) : 75.3  Temp (24hrs), Avg:98.4 F (36.9 C), Min:97.6 F (36.4 C), Max:98.9 F (37.2 C)  Recent Labs  Lab 05/26/21 0917 05/27/21 0318  WBC 128.3* 151.6*  CREATININE 0.81 0.97    Estimated Creatinine Clearance: 71.2 mL/min (by C-G formula based on SCr of 0.97 mg/dL).    No Known Allergies  Antimicrobials this admission: 12/2 Cefazolin for pre-op 12/3 Metronidazole >>   Microbiology results:  12/2 MRSA PCR: Negative  Thank you for allowing pharmacy to be a part of this patient's care.  Lestine Box, PharmD PGY2 Infectious Diseases Pharmacy Resident   Please check [email or web address] for unit-specific pharmacy phone numbers

## 2021-05-28 DIAGNOSIS — T8149XA Infection following a procedure, other surgical site, initial encounter: Secondary | ICD-10-CM | POA: Diagnosis not present

## 2021-05-28 LAB — CBC WITH DIFFERENTIAL/PLATELET
Abs Immature Granulocytes: 0.34 10*3/uL — ABNORMAL HIGH (ref 0.00–0.07)
Basophils Absolute: 0.4 10*3/uL — ABNORMAL HIGH (ref 0.0–0.1)
Basophils Relative: 0 %
Eosinophils Absolute: 0.1 10*3/uL (ref 0.0–0.5)
Eosinophils Relative: 0 %
HCT: 26.4 % — ABNORMAL LOW (ref 39.0–52.0)
Hemoglobin: 8.4 g/dL — ABNORMAL LOW (ref 13.0–17.0)
Immature Granulocytes: 0 %
Lymphocytes Relative: 89 %
Lymphs Abs: 111.8 10*3/uL — ABNORMAL HIGH (ref 0.7–4.0)
MCH: 30.1 pg (ref 26.0–34.0)
MCHC: 31.8 g/dL (ref 30.0–36.0)
MCV: 94.6 fL (ref 80.0–100.0)
Monocytes Absolute: 9.1 10*3/uL — ABNORMAL HIGH (ref 0.1–1.0)
Monocytes Relative: 7 %
Neutro Abs: 4.9 10*3/uL (ref 1.7–7.7)
Neutrophils Relative %: 4 %
Platelets: 84 10*3/uL — ABNORMAL LOW (ref 150–400)
RBC: 2.79 MIL/uL — ABNORMAL LOW (ref 4.22–5.81)
RDW: 15 % (ref 11.5–15.5)
Smear Review: DECREASED
WBC: 126.6 10*3/uL (ref 4.0–10.5)
nRBC: 0 % (ref 0.0–0.2)

## 2021-05-28 LAB — BASIC METABOLIC PANEL
Anion gap: 5 (ref 5–15)
BUN: 19 mg/dL (ref 8–23)
CO2: 22 mmol/L (ref 22–32)
Calcium: 8 mg/dL — ABNORMAL LOW (ref 8.9–10.3)
Chloride: 110 mmol/L (ref 98–111)
Creatinine, Ser: 0.8 mg/dL (ref 0.61–1.24)
GFR, Estimated: >60 mL/min (ref >60)
Glucose, Bld: 160 mg/dL — ABNORMAL HIGH (ref 70–99)
Potassium: 4.8 mmol/L (ref 3.5–5.1)
Sodium: 137 mmol/L (ref 135–145)

## 2021-05-28 LAB — GLUCOSE, CAPILLARY
Glucose-Capillary: 133 mg/dL — ABNORMAL HIGH (ref 70–99)
Glucose-Capillary: 122 mg/dL — ABNORMAL HIGH (ref 70–99)
Glucose-Capillary: 143 mg/dL — ABNORMAL HIGH (ref 70–99)
Glucose-Capillary: 159 mg/dL — ABNORMAL HIGH (ref 70–99)

## 2021-05-28 MED ORDER — METRONIDAZOLE 500 MG PO TABS
500.0000 mg | ORAL_TABLET | Freq: Two times a day (BID) | ORAL | Status: DC
  Administered 2021-05-28 – 2021-05-30 (×5): 500 mg via ORAL
  Filled 2021-05-28 (×5): qty 1

## 2021-05-28 NOTE — Progress Notes (Addendum)
PROGRESS NOTE    Scott Garrett  VFI:[id number] DOB: 08/15/1945 DOA: 05/26/2021 PCP: [name]   Brief Narrative: 75 year old with past medical history significant for dementia, diabetes, hypertension, T-cell lymphoproliferative disorder, PVD status post AKA presents with groin wound infection.  Previously admitted 10/17 until 11/3 with right lower extremity ischemia.  He underwent attempted thrombectomy and multiple he required bypass and right AKA.  Right groin surgical wound is open and he was seen in the clinic 11/30 with ultrasound showing residual fluid collection versus organized hematoma, patient was arranged to go to the OR 12/2 for wound exploration.   Assessment & Plan:   Principal Problem:   Wound infection after surgery Active Problems:   Critical limb ischemia of right lower extremity (HCC)   Dementia (HCC)   DM2 (diabetes mellitus, type 2) ([place])   HTN (hypertension)   Lymphoproliferative disease ([place])   1-Infected right femoral bypass graft; Groin wound infection: -Concern for right femoral bypass graft infection and so it was partially removed. -Underwent excision of right femoral bypass graft partial.  -Continue with  IV antibiotics due to leukocytosis.  Further plan per vascular.   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 WBC at 150-- one Month ago: 116---144 Monitor WBC down to 126 after staring antibiotics.  platelet and Hb.  Differential lymphocytes.   DVT: Right lower extremity:  Right femoral vein by arterial doppler 10-30 Resume anticoagulation when okay by vascular On lovenox for DVT prophylasix.  Will get doppler to reevaluate.  I dont see he was on anticoagulation out patient.   Dementia: Continue with Aricept, delirium precaution. As needed Haldol.  Diabetes type 2: A1c 11 Continue with Lantus Sliding  scale insulin  Hypertension: On lisinopril.        Estimated body mass index is 24.63 kg/m as calculated from the following:   Height as of this encounter: 5\' 11"  (1.803 m).   Weight as of this encounter: 80.1 kg.   DVT prophylaxis: Lovenox Code Status: Full code Family Communication: no family at bedside Disposition Plan:  Status is: Observation  The patient will require care spanning > 2 midnights and should be moved to inpatient because: infected femoral bypass, leukocytosis.        Consultants:  Vascular  Procedures:    Antimicrobials:    Subjective: He is alert, pleasantly confuse, great humor.   Objective: Vitals:   05/27/21 2047 05/28/21 0445 05/28/21 0804 05/28/21 1511  BP: 117/73 140/73 113/64 (!) 120/53  Pulse: (!) 109 (!) 107 76 73  Resp: 17 18 18 17   Temp: 98.5 F (36.9 C) 98.7 F (37.1 C) 99 F (37.2 C) 98.4 F (36.9 C)  TempSrc: Oral Oral Oral Oral  SpO2: 93% 94% 98% 100%  Weight:  80.1 kg    Height:        Intake/Output Summary (Last 24 hours) at 05/28/2021 1512 Last data filed at 05/28/2021 1100 Gross per 24 hour  Intake 100 ml  Output 400 ml  Net -300 ml    Filed Weights   05/26/21 0713 05/28/21 0445  Weight: 82.1 kg 80.1 kg    Examination:  General exam: NAD Respiratory system: CTA Cardiovascular system:  S 1, S  2 RRR.  Gastrointestinal system: BS present, soft, nt Central nervous system: Alert Extremities: Right AKA, groin cover.    Data Reviewed: I have personally reviewed following labs and imaging  studies  CBC: Recent Labs  Lab 05/26/21 0917 05/27/21 0318 05/28/21 0054  WBC 128.3* 151.6* 126.6*  NEUTROABS  --   --  4.9  HGB 10.7* 8.6* 8.4*  HCT 34.2* 27.6* 26.4*  MCV 96.6 94.5 94.6  PLT 87* 87* 84*    Basic Metabolic Panel: Recent Labs  Lab 05/26/21 0917 05/27/21 0318 05/28/21 0054  NA 141 135 137  K 4.6 4.7 4.8  CL 111 106 110  CO2 25 23 22   GLUCOSE 113* 234* 160*  BUN 24* 23 19   CREATININE 0.81 0.97 0.80  CALCIUM 8.6* 8.3* 8.0*    GFR: Estimated Creatinine Clearance: 86.3 mL/min (by C-G formula based on SCr of 0.8 mg/dL). Liver Function Tests: Recent Labs  Lab 05/26/21 0917  AST 29  ALT 28  ALKPHOS 110  BILITOT 0.7  PROT 5.6*  ALBUMIN 2.9*    No results for input(s): LIPASE, AMYLASE in the last 168 hours. No results for input(s): AMMONIA in the last 168 hours. Coagulation Profile: Recent Labs  Lab 05/26/21 0758 05/26/21 0918  INR DISREGARD RESULTS.  SPECIMEN IS HEMOLYZED.  NOTIFIED HALEY BOWMAN,RN AT 0849 05/26/2021 BY ZBEECH. 1.1    Cardiac Enzymes: No results for input(s): CKTOTAL, CKMB, CKMBINDEX, TROPONINI in the last 168 hours. BNP (last 3 results) No results for input(s): PROBNP in the last 8760 hours. HbA1C: No results for input(s): HGBA1C in the last 72 hours. CBG: Recent Labs  Lab 05/27/21 1213 05/27/21 1550 05/27/21 2158 05/28/21 0802 05/28/21 1139  GLUCAP 112* 157* 159* 133* 122*    Lipid Profile: No results for input(s): CHOL, HDL, LDLCALC, TRIG, CHOLHDL, LDLDIRECT in the last 72 hours. Thyroid Function Tests: No results for input(s): TSH, T4TOTAL, FREET4, T3FREE, THYROIDAB in the last 72 hours. Anemia Panel: No results for input(s): VITAMINB12, FOLATE, FERRITIN, TIBC, IRON, RETICCTPCT in the last 72 hours. Sepsis Labs: No results for input(s): PROCALCITON, LATICACIDVEN in the last 168 hours.  Recent Results (from the past 240 hour(s))  SARS Coronavirus 2 by RT PCR (hospital order, performed in [facility] hospital lab) Nasopharyngeal Nasopharyngeal Swab     Status: None   Collection Time: 05/26/21  6:53 AM   Specimen: Nasopharyngeal Swab  Result Value Ref Range Status   SARS Coronavirus 2 NEGATIVE NEGATIVE Final    Comment: (NOTE) SARS-CoV-2 target nucleic acids are NOT DETECTED.  The SARS-CoV-2 RNA is generally detectable in upper and lower respiratory specimens during the acute phase of infection. The  lowest concentration of SARS-CoV-2 viral copies this assay can detect is 250 copies / mL. A negative result does not preclude SARS-CoV-2 infection and should not be used as the sole basis for treatment or other patient management decisions.  A negative result may occur with improper specimen collection / handling, submission of specimen other than nasopharyngeal swab, presence of viral mutation(s) within the areas targeted by this assay, and inadequate number of viral copies (<250 copies / mL). A negative result must be combined with clinical observations, patient history, and epidemiological information.  Fact Sheet for Patients:   StrictlyIdeas.no  Fact Sheet for Healthcare Providers: BankingDealers.co.za  This test is not yet approved or  cleared by the [place] FDA and has been authorized for detection and/or diagnosis of SARS-CoV-2 by FDA under an Emergency Use Authorization (EUA).  This EUA will remain in effect (meaning this test can be used) for the duration of the COVID-19 declaration under Section 564(b)(1) of the Act, 21 U.S.C. section 360bbb-3(b)(1), unless the authorization is  terminated or revoked sooner.  Performed at [facility], [place] [address] [place], [place] [address]   Surgical pcr screen     Status: None   Collection Time: 05/26/21  8:32 AM   Specimen: Nasal Mucosa; Nasal Swab  Result Value Ref Range Status   MRSA, PCR NEGATIVE NEGATIVE Final   Staphylococcus aureus NEGATIVE NEGATIVE Final    Comment: (NOTE) The Xpert SA Assay (FDA approved for NASAL specimens in patients 84 years of age and older), is one component of a comprehensive surveillance program. It is not intended to diagnose infection nor to guide or monitor treatment. Performed at [facility], [place] [address] [place], [place] [address]           Radiology Studies: No results found.      Scheduled Meds:  docusate  sodium  100 mg Oral BID   donepezil  10 mg Oral QHS   enoxaparin (LOVENOX) injection  40 mg Subcutaneous Q24H   insulin aspart  0-15 Units Subcutaneous TID WC   insulin glargine-yfgn  10 Units Subcutaneous QHS   lisinopril  5 mg Oral Daily   metroNIDAZOLE  500 mg Oral Q12H   Continuous Infusions:  sodium chloride 100 mL/hr at 05/27/21 1603    ceFAZolin (ANCEF) IV 2 g (05/28/21 1334)     LOS: 2 days    Time spent: 35 minutes.     Elmarie Shiley, MD [facility]   If 7PM-7AM, please contact night-coverage [email or web address]  05/28/2021, 3:12 PM

## 2021-05-28 NOTE — Progress Notes (Addendum)
   VASCULAR SURGERY ASSESSMENT & PLAN:   POD 2 S/P EXCISION OF RIGHT FEMORAL BYPASS GRAFT AND PATCH ANGIOPLASTY OF THE RIGHT CFA USING RIGHT GSV: He has some mild oozing at the upper part of the incision which has slowed down.  There is no significant swelling or hematoma noted.  ID: He is on intravenous Ancef and Flagyl.  SUBJECTIVE:   No complaints this morning.  PHYSICAL EXAM:   Vitals:   05/27/21 0706 05/27/21 1606 05/27/21 2047 05/28/21 0445  BP: (!) 146/82 (!) 130/57 117/73 140/73  Pulse: 72 71 (!) 109 (!) 107  Resp: 18 17 17 18   Temp: 98.7 F (37.1 C) 98.5 F (36.9 C) 98.5 F (36.9 C) 98.7 F (37.1 C)  TempSrc: Oral Oral Oral Oral  SpO2: 99% 99% 93% 94%  Weight:    80.1 kg  Height:       His dressing on the right groin has a small amount of drainage.  LABS:   Lab Results  Component Value Date   WBC 126.6 (HH) 05/28/2021   HGB 8.4 (L) 05/28/2021   HCT 26.4 (L) 05/28/2021   MCV 94.6 05/28/2021   PLT 84 (L) 05/28/2021   Lab Results  Component Value Date   CREATININE 0.80 05/28/2021   Lab Results  Component Value Date   INR 1.1 05/26/2021   CBG (last 3)  Recent Labs    05/27/21 1213 05/27/21 1550 05/27/21 2158  GLUCAP 112* 157* 159*    PROBLEM LIST:    Principal Problem:   Wound infection after surgery Active Problems:   Critical limb ischemia of right lower extremity (HCC)   Dementia (HCC)   DM2 (diabetes mellitus, type 2) (HCC)   HTN (hypertension)   Lymphoproliferative disease (HCC)   CURRENT MEDS:    docusate sodium  100 mg Oral BID   donepezil  10 mg Oral QHS   enoxaparin (LOVENOX) injection  40 mg Subcutaneous Q24H   insulin aspart  0-15 Units Subcutaneous TID WC   insulin glargine-yfgn  10 Units Subcutaneous QHS   lisinopril  5 mg Oral Daily    Deitra Mayo Office: [phone] 05/28/2021

## 2021-05-29 ENCOUNTER — Inpatient Hospital Stay (HOSPITAL_COMMUNITY): Payer: No Typology Code available for payment source

## 2021-05-29 DIAGNOSIS — T8149XA Infection following a procedure, other surgical site, initial encounter: Secondary | ICD-10-CM | POA: Diagnosis not present

## 2021-05-29 DIAGNOSIS — Z89611 Acquired absence of right leg above knee: Secondary | ICD-10-CM

## 2021-05-29 DIAGNOSIS — Z9889 Other specified postprocedural states: Secondary | ICD-10-CM

## 2021-05-29 LAB — BASIC METABOLIC PANEL
Anion gap: 3 — ABNORMAL LOW (ref 5–15)
BUN: 18 mg/dL (ref 8–23)
CO2: 25 mmol/L (ref 22–32)
Calcium: 8 mg/dL — ABNORMAL LOW (ref 8.9–10.3)
Chloride: 109 mmol/L (ref 98–111)
Creatinine, Ser: 0.85 mg/dL (ref 0.61–1.24)
GFR, Estimated: >60 mL/min (ref >60)
Glucose, Bld: 129 mg/dL — ABNORMAL HIGH (ref 70–99)
Potassium: 4.9 mmol/L (ref 3.5–5.1)
Sodium: 137 mmol/L (ref 135–145)

## 2021-05-29 LAB — CBC
HCT: 24.5 % — ABNORMAL LOW (ref 39.0–52.0)
Hemoglobin: 7.6 g/dL — ABNORMAL LOW (ref 13.0–17.0)
MCH: 29.6 pg (ref 26.0–34.0)
MCHC: 31 g/dL (ref 30.0–36.0)
MCV: 95.3 fL (ref 80.0–100.0)
Platelets: 81 10*3/uL — ABNORMAL LOW (ref 150–400)
RBC: 2.57 MIL/uL — ABNORMAL LOW (ref 4.22–5.81)
RDW: 15 % (ref 11.5–15.5)
WBC: 159.6 10*3/uL (ref 4.0–10.5)
nRBC: 0 % (ref 0.0–0.2)

## 2021-05-29 LAB — GLUCOSE, CAPILLARY
Glucose-Capillary: 241 mg/dL — ABNORMAL HIGH (ref 70–99)
Glucose-Capillary: 264 mg/dL — ABNORMAL HIGH (ref 70–99)
Glucose-Capillary: 100 mg/dL — ABNORMAL HIGH (ref 70–99)
Glucose-Capillary: 242 mg/dL — ABNORMAL HIGH (ref 70–99)
Glucose-Capillary: 218 mg/dL — ABNORMAL HIGH (ref 70–99)
Glucose-Capillary: 163 mg/dL — ABNORMAL HIGH (ref 70–99)

## 2021-05-29 LAB — PATHOLOGIST SMEAR REVIEW

## 2021-05-29 MED ORDER — ADULT MULTIVITAMIN W/MINERALS CH
1.0000 | ORAL_TABLET | Freq: Every day | ORAL | Status: DC
  Administered 2021-05-29 – 2021-06-01 (×4): 1 via ORAL
  Filled 2021-05-29 (×4): qty 1

## 2021-05-29 MED ORDER — ENOXAPARIN SODIUM 80 MG/0.8ML IJ SOSY
80.0000 mg | PREFILLED_SYRINGE | Freq: Two times a day (BID) | INTRAMUSCULAR | Status: DC
  Administered 2021-05-29 – 2021-06-01 (×6): 80 mg via SUBCUTANEOUS
  Filled 2021-05-29 (×7): qty 0.8

## 2021-05-29 MED ORDER — ENSURE MAX PROTEIN PO LIQD
11.0000 [oz_av] | Freq: Every day | ORAL | Status: DC
  Administered 2021-05-29 – 2021-06-01 (×4): 11 [oz_av] via ORAL
  Filled 2021-05-29 (×4): qty 330

## 2021-05-29 NOTE — TOC Initial Note (Signed)
Transition of Care [facility]) - Initial/Assessment Note    Patient Details  Name: Scott Garrett MRN: [id number] Date of Birth: May 07, 1946  Transition of Care [facility]) CM/SW Contact:    Geralynn Ochs, LCSW Phone Number: 05/29/2021, 3:17 PM  Clinical Narrative:       CSW spoke with patient's daughter, [name], about patient returning to [facility]. Daughter requesting return to [facility] to continue his rehab, she is happy with care there. CSW confirmed with Kathleen Argue that patient can return. CSW updated MD that patient was in rehab, will need PT/OT orders for return. CSW to follow.            Expected Discharge Plan: Skilled Nursing Facility Barriers to Discharge: Continued Medical Work up, Ship broker   Patient Goals and CMS Choice Patient states their goals for this hospitalization and ongoing recovery are:: patient unable to participate in goal setting, not fully oriented CMS Medicare.gov Compare Post Acute Care list provided to:: Patient Represenative (must comment) Choice offered to / list presented to : Adult Children  Expected Discharge Plan and Services Expected Discharge Plan: [place] Acute Care Choice: [place] Living arrangements for the past 2 months: Apartment                                      Prior Living Arrangements/Services Living arrangements for the past 2 months: Apartment Lives with:: Self Patient language and need for interpreter reviewed:: No Do you feel safe going back to the place where you live?: Yes      Need for Family Participation in Patient Care: Yes (Comment) Care giver support system in place?: No (comment)   Criminal Activity/Legal Involvement Pertinent to Current Situation/Hospitalization: No - Comment as needed  Activities of Daily Living Home Assistive Devices/Equipment: Wheelchair, CBG Meter, Blood pressure cuff ADL Screening (condition at time of  admission) Patient's cognitive ability adequate to safely complete daily activities?: No Is the patient deaf or have difficulty hearing?: No Does the patient have difficulty seeing, even when wearing glasses/contacts?: No Does the patient have difficulty concentrating, remembering, or making decisions?: No Patient able to express need for assistance with ADLs?: Yes Does the patient have difficulty dressing or bathing?: Yes Independently performs ADLs?: No Communication: Independent Dressing (OT): Needs assistance Grooming: Needs assistance Feeding: Independent Bathing: Needs assistance Toileting: Needs assistance In/Out Bed: Needs assistance Does the patient have difficulty walking or climbing stairs?: Yes  Permission Sought/Granted Permission sought to share information with : Facility Sport and exercise psychologist, Family Supports Permission granted to share information with : Yes, Verbal Permission Granted  Share Information with NAME: [name]  Permission granted to share info w AGENCY: [place] granted to share info w Relationship: Daughter     Emotional Assessment   Attitude/Demeanor/Rapport: Unable to Assess Affect (typically observed): Unable to Assess Orientation: : Oriented to Self, Oriented to Place Alcohol / Substance Use: Not Applicable Psych Involvement: No (comment)  Admission diagnosis:  Wound infection after surgery [T81.49XA] Patient Active Problem List   Diagnosis Date Noted   Wound infection after surgery 05/26/2021   Lymphoproliferative disease ([place]) 04/27/2021   Pressure injury of skin 04/23/2021   Critical limb ischemia of right lower extremity ([place]) 04/10/2021   Dementia ([place]) 04/10/2021   DM2 (diabetes mellitus, type 2) ([place]) 04/10/2021   HTN (hypertension) 04/10/2021   Leukocytosis 04/10/2021   PCP:  Clinic, [place]:  No Pharmacies Listed    Social Determinants of Health (SDOH) Interventions    Readmission  Risk Interventions No flowsheet data found.

## 2021-05-29 NOTE — Progress Notes (Addendum)
Initial Nutrition Assessment  DOCUMENTATION CODES:   Not applicable  INTERVENTION:   Multivitamin w/ minerals daily Ensure Max po Daily, each supplement provides 150 kcal and 30 grams of protein.  Encourage good PO intake  NUTRITION DIAGNOSIS:   Increased nutrient needs related to wound healing as evidenced by estimated needs.  GOAL:   Patient will meet greater than or equal to 90% of their needs  MONITOR:   PO intake, Supplement acceptance, Labs, Skin  REASON FOR ASSESSMENT:   Consult Wound healing  ASSESSMENT:   75 y.o. male presented for wound exploration in the OR for a groin wound infection after recent bypass and R AKA during previous admission between 10/17-11/03. PMH includes T2DM, HTN, dementia, and PVD. PT admitted post-op wound exploration of R groin bypass infection.   Pt was sitting in bed at time of visit.   Pt states that he is depressed and that he was starving. Asked pt if I could get him any crackers or beverages from the unit; pt initially declined but eventually accepted. Pt kept saying that there was nothing that I could do for him. Reached out to RN.  RD unable to obtain any nutrition related history from pt; will attempt at follow-up. Per EMR, pt has ate 100% of his breakfast and lunch on 12/05.  No significant weight changes in EMR.  RD to order ONS for pt.  Medications reviewed and include: Colace, SSI 0-15 units TID, Semglee 10 units daily, Metronidazole, IV antibiotics Labs reviewed:  - Hgb A1c 11.5% (10/17) - 24 hr BG trends 100-159  NUTRITION - FOCUSED PHYSICAL EXAM:  Deferred to follow-up.  Diet Order:   Diet Order             Diet heart healthy/carb modified Room service appropriate? Yes; Fluid consistency: Thin  Diet effective now                   EDUCATION NEEDS:   Not appropriate for education at this time  Skin:  Skin Assessment: Skin Integrity Issues: Skin Integrity Issues:: Incisions Incisions: R Groin, R  leg  Last BM:  No Documentation  Height:   Ht Readings from Last 1 Encounters:  05/26/21 5\' 11"  (1.803 m)    Weight:   Wt Readings from Last 1 Encounters:  05/28/21 80.1 kg    Ideal Body Weight:  78.2 kg  BMI:  Body mass index is 24.63 kg/m.  Estimated Nutritional Needs:   Kcal:  2000-2200  Protein:  100-115 gram  Fluid:  > 2 L    Naira Standiford Louie Casa, RD, LDN Clinical Dietitian See [facility] for contact information.

## 2021-05-29 NOTE — Progress Notes (Signed)
ANTICOAGULATION CONSULT NOTE - Initial Consult  Pharmacy Consult for enoxaparin Indication:  DVT prophylaxis   No Known Allergies  Patient Measurements: Height: 5\' 11"  (180.3 cm) Weight: 80.1 kg (176 lb 9.4 oz) IBW/kg (Calculated) : 75.3   Vital Signs: Temp: 98.2 F (36.8 C) (12/05 0801) Temp Source: Oral (12/05 0801) BP: 143/72 (12/05 0801) Pulse Rate: 72 (12/05 0801)  Labs: Recent Labs    05/27/21 0318 05/28/21 0054 05/29/21 0131  HGB 8.6* 8.4* 7.6*  HCT 27.6* 26.4* 24.5*  PLT 87* 84* 81*  CREATININE 0.97 0.80 0.85    Estimated Creatinine Clearance: 81.2 mL/min (by C-G formula based on SCr of 0.85 mg/dL).   Medical History: Past Medical History:  Diagnosis Date   Colon polyps    Dementia ([place])    Diabetic retinopathy ([place])    DM2 (diabetes mellitus, type 2) ([place])    HTN (hypertension)    Leucocytosis       Assessment: 75 yo m with severe RLE PVD s/p attempted thrombectomy and required bypass but ultimately required R AKA during admission10/17 -11/3. Patient re-admitted 12/2 for infected right femoral bypass graft. Patient with "known DVT observed during attempted thrombectomy 10/17, still present in the right femoral vein" per doppler 10/31. Pharmacy consulted 12/5 for treatment dose enoxaparin.  Repeat doppler 12/5 is pending. Patient was last given 40mg  dose of enoxaparin at 13:30. Hgb has been down trending but had two procedures and has no visible bleeding.    Goal of Therapy:  Monitor platelets by anticoagulation protocol: Yes   Plan:  Start enoxaparin 80mg  Q12 hr  Monitor H/H and signs/symptoms of bleeding  F/u imaging     Benetta Spar, PharmD, BCPS, BCCP Clinical Pharmacist  Please check AMION for all [place] phone numbers After 10:00 PM, call [place] [phone]

## 2021-05-29 NOTE — Progress Notes (Addendum)
PROGRESS NOTE    Scott Garrett  HER:[id number] DOB: 1945/12/16 DOA: 05/26/2021 PCP: [name]   Brief Narrative: 75 year old with past medical history significant for dementia, diabetes, hypertension, T-cell lymphoproliferative disorder, PVD status post AKA presents with groin wound infection.  Previously admitted 10/17 until 11/3 with right lower extremity ischemia.  He underwent attempted thrombectomy and multiple he required bypass and right AKA.  Right groin surgical wound is open and he was seen in the clinic 11/30 with ultrasound showing residual fluid collection versus organized hematoma, patient was arranged to go to the OR 12/2 for wound exploration.   Assessment & Plan:   Principal Problem:   Wound infection after surgery Active Problems:   Critical limb ischemia of right lower extremity (HCC)   Dementia (HCC)   DM2 (diabetes mellitus, type 2) ([place])   HTN (hypertension)   Lymphoproliferative disease ([place])   1-Infected right femoral bypass graft; Groin wound infection: -Concern for right femoral bypass graft infection and so it was partially removed. -Underwent excision of right femoral bypass graft partial.  -Continue with  IV antibiotics due to leukocytosis.  Further plan per vascular.   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 WBC at 150-- one Month ago: 116---144 Monitor WBC down to 126 after staring antibiotics.  Platelet and Hb.  Differential lymphocytes.   DVT: Right lower extremity:  Arterial doppler 10-30: "Known DVT observed during attempted thrombectomy 10/17, still present in  the right femoral vein.  Unable to re-evaluate for DVT due to staples.  I dont see he was on anticoagulation out patient.  Discussed with Dr Donzetta Matters, he recommend to start Lovenox treatment dose for thrombosis. I will order Doppler Left LE as  well.  Plan to monitor platelet count.  Discussed with Daughter risk for bleeding, she agrees with anticoagulation.  Patient will need to be discharge on Lovenox or Eliquis.   Dementia: Continue with Aricept, delirium precaution. As needed Haldol.  Diabetes type 2: A1c 11 Continue with Lantus. Needs to continue with long acting insulin at discharge.  Sliding scale insulin  Hypertension: On lisinopril.        Estimated body mass index is 24.63 kg/m as calculated from the following:   Height as of this encounter: 5\' 11"  (1.803 m).   Weight as of this encounter: 80.1 kg.   DVT prophylaxis: Lovenox Code Status: Full code Family Communication: Daughter over phone 12/05 Disposition Plan:  Status is: Observation  The patient will require care spanning > 2 midnights and should be moved to inpatient because: infected femoral bypass, leukocytosis.        Consultants:  Vascular  Procedures:    Antimicrobials:    Subjective: He is pleasantly confuse. Denies pain   Objective: Vitals:   05/28/21 1511 05/28/21 2106 05/29/21 0500 05/29/21 0801  BP: (!) 120/53 137/74 132/65 (!) 143/72  Pulse: 73 67 67 72  Resp: 17 18 18 19   Temp: 98.4 F (36.9 C) 98.3 F (36.8 C) 98.9 F (37.2 C) 98.2 F (36.8 C)  TempSrc: Oral Oral Oral Oral  SpO2: 100% 98% 96% 98%  Weight:      Height:        Intake/Output Summary (Last 24 hours) at 05/29/2021 1530 Last data filed at 05/29/2021 1249 Gross per 24 hour  Intake 1040 ml  Output 700 ml  Net 340 ml    [employer]  05/26/21 0713 05/28/21 0445  Weight: 82.1 kg 80.1 kg    Examination:  General exam: NAD Respiratory system: CTA Cardiovascular system:   S 1, S 2  RRR Gastrointestinal system: BS present, soft, nt Central nervous system: alert Extremities: Right AKA, groin cover.    Data Reviewed: I have personally reviewed following labs and imaging studies  CBC: Recent Labs  Lab 05/26/21 0917 05/27/21 0318  05/28/21 0054 05/29/21 0131  WBC 128.3* 151.6* 126.6* 159.6*  NEUTROABS  --   --  4.9  --   HGB 10.7* 8.6* 8.4* 7.6*  HCT 34.2* 27.6* 26.4* 24.5*  MCV 96.6 94.5 94.6 95.3  PLT 87* 87* 84* 81*    Basic Metabolic Panel: Recent Labs  Lab 05/26/21 0917 05/27/21 0318 05/28/21 0054 05/29/21 0131  NA 141 135 137 137  K 4.6 4.7 4.8 4.9  CL 111 106 110 109  CO2 25 23 22 25   GLUCOSE 113* 234* 160* 129*  BUN 24* 23 19 18   CREATININE 0.81 0.97 0.80 0.85  CALCIUM 8.6* 8.3* 8.0* 8.0*    GFR: Estimated Creatinine Clearance: 81.2 mL/min (by C-G formula based on SCr of 0.85 mg/dL). Liver Function Tests: Recent Labs  Lab 05/26/21 0917  AST 29  ALT 28  ALKPHOS 110  BILITOT 0.7  PROT 5.6*  ALBUMIN 2.9*    No results for input(s): LIPASE, AMYLASE in the last 168 hours. No results for input(s): AMMONIA in the last 168 hours. Coagulation Profile: Recent Labs  Lab 05/26/21 0758 05/26/21 0918  INR DISREGARD RESULTS.  SPECIMEN IS HEMOLYZED.  NOTIFIED HALEY BOWMAN,RN AT 0849 05/26/2021 BY ZBEECH. 1.1    Cardiac Enzymes: No results for input(s): CKTOTAL, CKMB, CKMBINDEX, TROPONINI in the last 168 hours. BNP (last 3 results) No results for input(s): PROBNP in the last 8760 hours. HbA1C: No results for input(s): HGBA1C in the last 72 hours. CBG: Recent Labs  Lab 05/28/21 1139 05/28/21 1627 05/28/21 2111 05/29/21 0803 05/29/21 1138  GLUCAP 122* 143* 159* 100* 242*    Lipid Profile: No results for input(s): CHOL, HDL, LDLCALC, TRIG, CHOLHDL, LDLDIRECT in the last 72 hours. Thyroid Function Tests: No results for input(s): TSH, T4TOTAL, FREET4, T3FREE, THYROIDAB in the last 72 hours. Anemia Panel: No results for input(s): VITAMINB12, FOLATE, FERRITIN, TIBC, IRON, RETICCTPCT in the last 72 hours. Sepsis Labs: No results for input(s): PROCALCITON, LATICACIDVEN in the last 168 hours.  Recent Results (from the past 240 hour(s))  SARS Coronavirus 2 by RT PCR (hospital order,  performed in [facility] hospital lab) Nasopharyngeal Nasopharyngeal Swab     Status: None   Collection Time: 05/26/21  6:53 AM   Specimen: Nasopharyngeal Swab  Result Value Ref Range Status   SARS Coronavirus 2 NEGATIVE NEGATIVE Final    Comment: (NOTE) SARS-CoV-2 target nucleic acids are NOT DETECTED.  The SARS-CoV-2 RNA is generally detectable in upper and lower respiratory specimens during the acute phase of infection. The lowest concentration of SARS-CoV-2 viral copies this assay can detect is 250 copies / mL. A negative result does not preclude SARS-CoV-2 infection and should not be used as the sole basis for treatment or other patient management decisions.  A negative result may occur with improper specimen collection / handling, submission of specimen other than nasopharyngeal swab, presence of viral mutation(s) within the areas targeted by this assay, and inadequate number of viral copies (<250 copies / mL). A negative result must be combined with clinical observations, patient history, and epidemiological information.  Fact Sheet  for Patients:   StrictlyIdeas.no  Fact Sheet for Healthcare Providers: BankingDealers.co.za  This test is not yet approved or  cleared by the [place] FDA and has been authorized for detection and/or diagnosis of SARS-CoV-2 by FDA under an Emergency Use Authorization (EUA).  This EUA will remain in effect (meaning this test can be used) for the duration of the COVID-19 declaration under Section 564(b)(1) of the Act, 21 U.S.C. section 360bbb-3(b)(1), unless the authorization is terminated or revoked sooner.  Performed at [facility], [place] [address] [place], [place] [address]   Surgical pcr screen     Status: None   Collection Time: 05/26/21  8:32 AM   Specimen: Nasal Mucosa; Nasal Swab  Result Value Ref Range Status   MRSA, PCR NEGATIVE NEGATIVE Final   Staphylococcus aureus  NEGATIVE NEGATIVE Final    Comment: (NOTE) The Xpert SA Assay (FDA approved for NASAL specimens in patients 74 years of age and older), is one component of a comprehensive surveillance program. It is not intended to diagnose infection nor to guide or monitor treatment. Performed at [facility], [place] [address], [place], [place] [address]           Radiology Studies: No results found.      Scheduled Meds:  docusate sodium  100 mg Oral BID   donepezil  10 mg Oral QHS   enoxaparin (LOVENOX) injection  40 mg Subcutaneous Q24H   insulin aspart  0-15 Units Subcutaneous TID WC   insulin glargine-yfgn  10 Units Subcutaneous QHS   lisinopril  5 mg Oral Daily   metroNIDAZOLE  500 mg Oral Q12H   Continuous Infusions:  sodium chloride 100 mL/hr at 05/27/21 1603    ceFAZolin (ANCEF) IV 2 g (05/29/21 0631)     LOS: 2 days    Time spent: 35 minutes.     Elmarie Shiley, MD [facility]   If 7PM-7AM, please contact night-coverage [email or web address]  05/29/2021, 3:30 PM

## 2021-05-29 NOTE — Progress Notes (Addendum)
[facility]     Assessment/Planning: POD # 3 1.  Excision of right femoral bypass graft (partial) 2.  Harvest right greater saphenous vein 3.  Right common femoral artery vein patch angioplasty  Right AKA appears viable and is warm to touch. Cultures negative to date for blood cultures  History of leukocytosis followed by Oncology Afebrile, denise fever and chills. On IV antibiotics Ancef and Flagyl Continue dry dressing as need right groin   Subjective  - Upset about his overall situation   Objective 132/65 67 98.9 F (37.2 C) (Oral) 18 96%  Intake/Output Summary (Last 24 hours) at 05/29/2021 0711 Last data filed at 05/29/2021 0515 Gross per 24 hour  Intake 340 ml  Output 1400 ml  Net -1060 ml    Right groin with bloody drainage, no frank bleeding central and superior incision.  No abnormal erythema or edema.  No evidence of hematoma. Dry dressing changed Right AKA incision dressing clean and dry Stump warm to touch, appears viable Lungs non labored breathing    Roxy Horseman 05/29/2021 7:11 AM --  Laboratory Lab Results: Recent Labs    05/28/21 0054 05/29/21 0131  WBC 126.6* 159.6*  HGB 8.4* 7.6*  HCT 26.4* 24.5*  PLT 84* 81*   BMET Recent Labs    05/28/21 0054 05/29/21 0131  NA 137 137  K 4.8 4.9  CL 110 109  CO2 22 25  GLUCOSE 160* 129*  BUN 19 18  CREATININE 0.80 0.85  CALCIUM 8.0* 8.0*    COAG Lab Results  Component Value Date   INR 1.1 05/26/2021   INR  05/26/2021    DISREGARD RESULTS.  SPECIMEN IS HEMOLYZED.  NOTIFIED HALEY BOWMAN,RN AT 2585 05/26/2021 BY ZBEECH.   No results found for: PTT   I have independently interviewed and examined patient and agree with PA assessment and plan above.   Travers Goodley C. Donzetta Matters, MD [facility] [place] Office: [phone] Pager: [phone]

## 2021-05-29 NOTE — Progress Notes (Signed)
Attempted right lower extremity venous duplex. Unable to perform exam due to bandaging and staples.   Scott Garrett 05/29/2021 11:46 AM

## 2021-05-29 NOTE — NC FL2 (Signed)
[place] MEDICAID FL2 LEVEL OF CARE SCREENING TOOL     IDENTIFICATION  Patient Name: Scott Garrett Birthdate: May 07, 1946 Sex: male Admission Date (Current Location): 05/26/2021  [facility] and [place] Number:  Herbalist and Address:  [facility], [place] [address], [place], [place] [address]      Provider Number: [id number]  Attending Physician Name and Address:  Elmarie Shiley, MD  Relative Name and Phone Number:       Current Level of Care: Hospital Recommended Level of Care: [place] Prior Approval Number:    Date Approved/Denied:   PASRR Number: [id number]  Discharge Plan: SNF    Current Diagnoses: Patient Active Problem List   Diagnosis Date Noted   Wound infection after surgery 05/26/2021   Lymphoproliferative disease ([place]) 04/27/2021   Pressure injury of skin 04/23/2021   Critical limb ischemia of right lower extremity ([place]) 04/10/2021   Dementia ([place]) 04/10/2021   DM2 (diabetes mellitus, type 2) ([place]) 04/10/2021   HTN (hypertension) 04/10/2021   Leukocytosis 04/10/2021    Orientation RESPIRATION BLADDER Height & Weight     Self, Place  Normal Incontinent Weight: 176 lb 9.4 oz (80.1 kg) Height:  5\' 11"  (180.3 cm)  BEHAVIORAL SYMPTOMS/MOOD NEUROLOGICAL BOWEL NUTRITION STATUS      Continent Diet (heart healthy/carb modified)  AMBULATORY STATUS COMMUNICATION OF NEEDS Skin   Extensive Assist Verbally Surgical wounds (closed right groin: gauze dressing change 2x/day; right leg, gauze dressing)                       Personal Care Assistance Level of Assistance  Bathing, Feeding, Dressing Bathing Assistance: Maximum assistance Feeding assistance: Limited assistance Dressing Assistance: Maximum assistance     Functional Limitations Info             SPECIAL CARE FACTORS FREQUENCY  PT (By licensed PT), OT (By licensed OT)     PT Frequency: 5x/wk OT Frequency: 5x/wk             Contractures Contractures Info: Not present    Additional Factors Info  Code Status, Allergies, Psychotropic, Insulin Sliding Scale Code Status Info: Full Allergies Info: NKA Psychotropic Info: Aricept 10mg  daily at bed Insulin Sliding Scale Info: see DC summary       Current Medications (05/29/2021):  This is the current hospital active medication list Current Facility-Administered Medications  Medication Dose Route Frequency Provider Last Rate Last Admin   0.9 %  sodium chloride infusion   Intravenous Continuous Regalado, Belkys A, MD 100 mL/hr at 05/27/21 1603 Rate Change at 05/27/21 1603   acetaminophen (TYLENOL) tablet 650 mg  650 mg Oral Q6H PRN Karmen Bongo, MD       Or   acetaminophen (TYLENOL) suppository 650 mg  650 mg Rectal Q6H PRN Karmen Bongo, MD       bisacodyl (DULCOLAX) EC tablet 5 mg  5 mg Oral Daily PRN Karmen Bongo, MD       ceFAZolin (ANCEF) IVPB 2g/100 mL premix  2 g Intravenous Q8H Regalado, Belkys A, MD 200 mL/hr at 05/29/21 1535 2 g at 05/29/21 1535   docusate sodium (COLACE) capsule 100 mg  100 mg Oral BID Karmen Bongo, MD   100 mg at 05/28/21 2031   donepezil (ARICEPT) tablet 10 mg  10 mg Oral Ivery Quale, MD   10 mg at 05/28/21 2031   enoxaparin (LOVENOX) injection 40 mg  40 mg Subcutaneous Q24H Karmen Bongo,  MD   40 mg at 05/29/21 1324   haloperidol lactate (HALDOL) injection 5 mg  5 mg Intravenous Q6H PRN Karmen Bongo, MD       hydrALAZINE (APRESOLINE) injection 5 mg  5 mg Intravenous Q4H PRN Karmen Bongo, MD       insulin aspart (novoLOG) injection 0-15 Units  0-15 Units Subcutaneous TID WC Karmen Bongo, MD   5 Units at 05/29/21 1158   insulin glargine-yfgn (SEMGLEE) injection 10 Units  10 Units Subcutaneous QHS Karmen Bongo, MD   10 Units at 05/28/21 2117   lisinopril (ZESTRIL) tablet 5 mg  5 mg Oral Daily Karmen Bongo, MD   5 mg at 05/29/21 0954   metroNIDAZOLE (FLAGYL) tablet 500 mg  500 mg Oral Q12H Regalado,  Belkys A, MD   500 mg at 05/29/21 0954   morphine 2 MG/ML injection 2 mg  2 mg Intravenous Q2H PRN Rhyne, Samantha J, PA-C       ondansetron (ZOFRAN) tablet 4 mg  4 mg Oral Q6H PRN Karmen Bongo, MD       Or   ondansetron [facility]) injection 4 mg  4 mg Intravenous Q6H PRN Karmen Bongo, MD       oxyCODONE (Oxy IR/ROXICODONE) immediate release tablet 5 mg  5 mg Oral Q4H PRN Karmen Bongo, MD       oxyCODONE-acetaminophen (PERCOCET/ROXICET) 5-325 MG per tablet 1-2 tablet  1-2 tablet Oral Q6H PRN Rhyne, Samantha J, PA-C       polyethylene glycol (MIRALAX / GLYCOLAX) packet 17 g  17 g Oral Daily PRN Karmen Bongo, MD         Discharge Medications: Please see discharge summary for a list of discharge medications.  Relevant Imaging Results:  Relevant Lab Results:   Additional Information SS#: [id number]  Geralynn Ochs, LCSW

## 2021-05-30 ENCOUNTER — Inpatient Hospital Stay (HOSPITAL_COMMUNITY): Payer: No Typology Code available for payment source

## 2021-05-30 DIAGNOSIS — D696 Thrombocytopenia, unspecified: Secondary | ICD-10-CM | POA: Diagnosis not present

## 2021-05-30 DIAGNOSIS — Z7901 Long term (current) use of anticoagulants: Secondary | ICD-10-CM

## 2021-05-30 DIAGNOSIS — R609 Edema, unspecified: Secondary | ICD-10-CM | POA: Diagnosis not present

## 2021-05-30 DIAGNOSIS — T8149XA Infection following a procedure, other surgical site, initial encounter: Secondary | ICD-10-CM | POA: Diagnosis not present

## 2021-05-30 DIAGNOSIS — C9150 Adult T-cell lymphoma/leukemia (HTLV-1-associated) not having achieved remission: Secondary | ICD-10-CM | POA: Diagnosis not present

## 2021-05-30 LAB — GLUCOSE, CAPILLARY
Glucose-Capillary: 190 mg/dL — ABNORMAL HIGH (ref 70–99)
Glucose-Capillary: 171 mg/dL — ABNORMAL HIGH (ref 70–99)
Glucose-Capillary: 150 mg/dL — ABNORMAL HIGH (ref 70–99)
Glucose-Capillary: 93 mg/dL (ref 70–99)

## 2021-05-30 LAB — CBC
HCT: 25 % — ABNORMAL LOW (ref 39.0–52.0)
Hemoglobin: 7.7 g/dL — ABNORMAL LOW (ref 13.0–17.0)
MCH: 29.3 pg (ref 26.0–34.0)
MCHC: 30.8 g/dL (ref 30.0–36.0)
MCV: 95.1 fL (ref 80.0–100.0)
Platelets: 86 10*3/uL — ABNORMAL LOW (ref 150–400)
RBC: 2.63 MIL/uL — ABNORMAL LOW (ref 4.22–5.81)
RDW: 14.8 % (ref 11.5–15.5)
WBC: 168.2 10*3/uL (ref 4.0–10.5)
nRBC: 0 % (ref 0.0–0.2)

## 2021-05-30 NOTE — Progress Notes (Signed)
Lower extremity venous has been completed.   Preliminary results in CV Proc.   Jinny Blossom Lyah Millirons 05/30/2021 9:53 AM

## 2021-05-30 NOTE — Progress Notes (Addendum)
[facility] [place]    POD # 3 1.  Excision of right femoral bypass graft (partial) 2.  Harvest right greater saphenous vein 3.  Right common femoral artery vein patch angioplasty   Right AKA appears viable and is warm to touch. Continued bloody drainage from right groin incision HGB 7.7 asymptomatic Cultures negative to date for blood cultures  History of leukocytosis followed by Oncology Afebrile, denise fever and chills. On IV antibiotics Ancef and Flagyl Continue dry dressing as need right groin    Subjective  - no new complaints right amputation site.   Objective 135/66 69 98.3 F (36.8 C) (Oral) 18 100%  Intake/Output Summary (Last 24 hours) at 05/30/2021 0720 Last data filed at 05/29/2021 1656 Gross per 24 hour  Intake 1280 ml  Output --  Net 1280 ml   Stump warm appears viable, dressing changed right groin, cont. Bloody drainage Lungs non labored breathing     Roxy Horseman 05/30/2021 7:20 AM --  Laboratory Lab Results: Recent Labs    05/29/21 0131 05/30/21 0105  WBC 159.6* 168.2*  HGB 7.6* 7.7*  HCT 24.5* 25.0*  PLT 81* 86*   BMET Recent Labs    05/28/21 0054 05/29/21 0131  NA 137 137  K 4.8 4.9  CL 110 109  CO2 22 25  GLUCOSE 160* 129*  BUN 19 18  CREATININE 0.80 0.85  CALCIUM 8.0* 8.0*    COAG Lab Results  Component Value Date   INR 1.1 05/26/2021   INR  05/26/2021    DISREGARD RESULTS.  SPECIMEN IS HEMOLYZED.  NOTIFIED HALEY BOWMAN,RN AT 2500 05/26/2021 BY ZBEECH.   No results found for: PTT  I have independently interviewed and examined patient and agree with PA assessment and plan above. Will need full dose anticoagulation for recent dvt. Continue to monitor right groin drainage.   Carly Applegate C. Donzetta Matters, MD [facility] [place] Office: [phone] Pager: [phone]

## 2021-05-30 NOTE — Progress Notes (Signed)
PROGRESS NOTE    Tracie Lindbloom  AQT:[id number] DOB: 1945/08/20 DOA: 05/26/2021 PCP: [name]   Brief Narrative: 75 year old with past medical history significant for dementia, diabetes, hypertension, T-cell lymphoproliferative disorder, PVD status post AKA presents with groin wound infection.  Previously admitted 10/17 until 11/3 with right lower extremity ischemia.  He underwent attempted thrombectomy and multiple he required bypass and right AKA.  Right groin surgical wound is open and he was seen in the clinic 11/30 with ultrasound showing residual fluid collection versus organized hematoma, patient was arranged to go to the OR 12/2 for wound exploration.  Patient underwent excision of the right femoral bypass graft partial.  He was a started on IV antibiotics.  Patient has a known right femoral DVT.  He was not on anticoagulation as an outpatient.  Discussed with [name] with vascular and Dr. Learta Codding okay to proceed with  anticoagulation for DVT. He will need to be discharged on Eliquis.   Assessment & Plan:   Principal Problem:   Wound infection after surgery Active Problems:   Critical limb ischemia of right lower extremity (HCC)   Dementia (HCC)   DM2 (diabetes mellitus, type 2) ([place])   HTN (hypertension)   Lymphoproliferative disease ([place])   1-Infected right femoral bypass graft; Groin wound infection: -Concern for right femoral bypass graft infection and so it was partially removed. -Underwent excision of right femoral bypass graft partial.  -Continue with  IV antibiotics, transition to oral at discharge. Further plan per vascular.   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 WBC at 150-- one Month ago: 116---144 Monitor WBC down to 126 after staring antibiotics.  Platelet and Hb.  Differential lymphocytes.  Appreciate Dr.  Learta Codding follow-up, plan to continue with observation   DVT: Right lower extremity:  Arterial doppler 10-30: "Known DVT observed during attempted thrombectomy 10/17, still present in  the right femoral vein.  Doppler: 12/06: Age indeterminate deep vein thrombosis involving the right femoral vein.  Negative DVT on the left. I dont see he was on anticoagulation out patient.  Discussed with Dr Donzetta Matters, he recommend to start Lovenox treatment dose for DVT.  Discussed with Daughter risk for bleeding, she agrees with anticoagulation.  Patient will need to be discharge on  Eliquis treatment for DVT>  Plan to monitor on Lovenox for another 24 hours monitor hemoglobin and platelets count.  Plan is to transition to Eliquis at discharge.   Dementia: Continue with Aricept, delirium precaution. As needed Haldol.  Diabetes type 2: A1c 11 Continue with Lantus. Needs to continue with long acting insulin at discharge.  Sliding scale insulin  Hypertension: On lisinopril.        Estimated body mass index is 24.63 kg/m as calculated from the following:   Height as of this encounter: 5\' 11"  (1.803 m).   Weight as of this encounter: 80.1 kg.   DVT prophylaxis: Lovenox Code Status: Full code Family Communication: Daughter over phone 12/06 Disposition Plan:  Status is: Observation  The patient will require care spanning > 2 midnights and should be moved to inpatient because: infected femoral bypass, leukocytosis.   -Discharge in 1 or two days if counts remain stable on anticoagulation.        Consultants:  Vascular  Procedures:  Underwent excision of right femoral bypass graft partial.   Antimicrobials:    Subjective: He is hungry, no new  complaint   Objective: Vitals:   05/29/21 0801 05/29/21 1631 05/29/21 2058 05/30/21 0527  BP: (!) 143/72 (!) 114/58 138/64 135/66  Pulse: 72 93 69 69  Resp: 19 20 18    Temp: 98.2 F (36.8 C) 98.1 F (36.7 C) 98.4 F (36.9 C) 98.3 F  (36.8 C)  TempSrc: Oral Oral Oral Oral  SpO2: 98% 99% 100% 100%  Weight:      Height:        Intake/Output Summary (Last 24 hours) at 05/30/2021 1431 Last data filed at 05/30/2021 0931 Gross per 24 hour  Intake 820 ml  Output --  Net 820 ml    Filed Weights   05/26/21 0713 05/28/21 0445  Weight: 82.1 kg 80.1 kg    Examination:  General exam: NAD Respiratory system: CTA Cardiovascular system:   S 1, S  2 RRR Gastrointestinal system: BS present, soft, nt Central nervous system: Alert Extremities: Right AKA, groin cover.    Data Reviewed: I have personally reviewed following labs and imaging studies  CBC: Recent Labs  Lab 05/26/21 0917 05/27/21 0318 05/28/21 0054 05/29/21 0131 05/30/21 0105  WBC 128.3* 151.6* 126.6* 159.6* 168.2*  NEUTROABS  --   --  4.9  --   --   HGB 10.7* 8.6* 8.4* 7.6* 7.7*  HCT 34.2* 27.6* 26.4* 24.5* 25.0*  MCV 96.6 94.5 94.6 95.3 95.1  PLT 87* 87* 84* 81* 86*    Basic Metabolic Panel: Recent Labs  Lab 05/26/21 0917 05/27/21 0318 05/28/21 0054 05/29/21 0131  NA 141 135 137 137  K 4.6 4.7 4.8 4.9  CL 111 106 110 109  CO2 25 23 22 25   GLUCOSE 113* 234* 160* 129*  BUN 24* 23 19 18   CREATININE 0.81 0.97 0.80 0.85  CALCIUM 8.6* 8.3* 8.0* 8.0*    GFR: Estimated Creatinine Clearance: 81.2 mL/min (by C-G formula based on SCr of 0.85 mg/dL). Liver Function Tests: Recent Labs  Lab 05/26/21 0917  AST 29  ALT 28  ALKPHOS 110  BILITOT 0.7  PROT 5.6*  ALBUMIN 2.9*    No results for input(s): LIPASE, AMYLASE in the last 168 hours. No results for input(s): AMMONIA in the last 168 hours. Coagulation Profile: Recent Labs  Lab 05/26/21 0758 05/26/21 0918  INR DISREGARD RESULTS.  SPECIMEN IS HEMOLYZED.  NOTIFIED HALEY BOWMAN,RN AT 0849 05/26/2021 BY ZBEECH. 1.1    Cardiac Enzymes: No results for input(s): CKTOTAL, CKMB, CKMBINDEX, TROPONINI in the last 168 hours. BNP (last 3 results) No results for input(s): PROBNP in the last  8760 hours. HbA1C: No results for input(s): HGBA1C in the last 72 hours. CBG: Recent Labs  Lab 05/29/21 1138 05/29/21 1623 05/29/21 2058 05/30/21 0906 05/30/21 1222  GLUCAP 242* 218* 163* 190* 171*    Lipid Profile: No results for input(s): CHOL, HDL, LDLCALC, TRIG, CHOLHDL, LDLDIRECT in the last 72 hours. Thyroid Function Tests: No results for input(s): TSH, T4TOTAL, FREET4, T3FREE, THYROIDAB in the last 72 hours. Anemia Panel: No results for input(s): VITAMINB12, FOLATE, FERRITIN, TIBC, IRON, RETICCTPCT in the last 72 hours. Sepsis Labs: No results for input(s): PROCALCITON, LATICACIDVEN in the last 168 hours.  Recent Results (from the past 240 hour(s))  SARS Coronavirus 2 by RT PCR (hospital order, performed in [facility] hospital lab) Nasopharyngeal Nasopharyngeal Swab     Status: None   Collection Time: 05/26/21  6:53 AM   Specimen: Nasopharyngeal Swab  Result Value Ref Range Status   SARS Coronavirus 2 NEGATIVE NEGATIVE Final  Comment: (NOTE) SARS-CoV-2 target nucleic acids are NOT DETECTED.  The SARS-CoV-2 RNA is generally detectable in upper and lower respiratory specimens during the acute phase of infection. The lowest concentration of SARS-CoV-2 viral copies this assay can detect is 250 copies / mL. A negative result does not preclude SARS-CoV-2 infection and should not be used as the sole basis for treatment or other patient management decisions.  A negative result may occur with improper specimen collection / handling, submission of specimen other than nasopharyngeal swab, presence of viral mutation(s) within the areas targeted by this assay, and inadequate number of viral copies (<250 copies / mL). A negative result must be combined with clinical observations, patient history, and epidemiological information.  Fact Sheet for Patients:   StrictlyIdeas.no  Fact Sheet for Healthcare  Providers: BankingDealers.co.za  This test is not yet approved or  cleared by the [place] FDA and has been authorized for detection and/or diagnosis of SARS-CoV-2 by FDA under an Emergency Use Authorization (EUA).  This EUA will remain in effect (meaning this test can be used) for the duration of the COVID-19 declaration under Section 564(b)(1) of the Act, 21 U.S.C. section 360bbb-3(b)(1), unless the authorization is terminated or revoked sooner.  Performed at [facility], [place] [address], [place], [place] [address]   Surgical pcr screen     Status: None   Collection Time: 05/26/21  8:32 AM   Specimen: Nasal Mucosa; Nasal Swab  Result Value Ref Range Status   MRSA, PCR NEGATIVE NEGATIVE Final   Staphylococcus aureus NEGATIVE NEGATIVE Final    Comment: (NOTE) The Xpert SA Assay (FDA approved for NASAL specimens in patients 50 years of age and older), is one component of a comprehensive surveillance program. It is not intended to diagnose infection nor to guide or monitor treatment. Performed at [facility], [place] [address] [place], [place] [address]           Radiology Studies: VAS [place] LOWER EXTREMITY VENOUS (DVT)  Result Date: 05/30/2021  Lower Venous DVT Study Patient Name:  Scott Garrett  Date of Exam:   05/30/2021 Medical Rec #: [id number]       Accession #:    [id number] Date of Birth: [birth year]       Patient Gender: M Patient Age:   75 years Exam Location:  [facility] Procedure:      VAS [place] LOWER EXTREMITY VENOUS (DVT) Referring Phys: Jerald Kief Conley Pawling --------------------------------------------------------------------------------  Indications: Edema.  Limitations: Bandages and staples Comparison Study: dvt noted during thrombectomy 10/17 and during arterial duplex                   study 10/30. Performing Technologist: Archie Patten RVS  Examination Guidelines: A complete evaluation includes B-mode imaging, spectral Doppler,  color Doppler, and power Doppler as needed of all accessible portions of each vessel. Bilateral testing is considered an integral part of a complete examination. Limited examinations for reoccurring indications may be performed as noted. The reflux portion of the exam is performed with the patient in reverse Trendelenburg.  +--------+---------------+---------+-----------+----------+--------------------+ RIGHT   CompressibilityPhasicitySpontaneityPropertiesThrombus Aging       +--------+---------------+---------+-----------+----------+--------------------+ CFV                                                  patent by color  doppler              +--------+---------------+---------+-----------+----------+--------------------+ SFJ                                                  Not well visualized  +--------+---------------+---------+-----------+----------+--------------------+ FV Prox                                              Not well visualized  +--------+---------------+---------+-----------+----------+--------------------+ FV Mid  None           No       No                   Age Indeterminate    +--------+---------------+---------+-----------+----------+--------------------+ FV      None           No       No                   Age Indeterminate    Distal                                                                    +--------+---------------+---------+-----------+----------+--------------------+   +---------+---------------+---------+-----------+----------+--------------+ LEFT     CompressibilityPhasicitySpontaneityPropertiesThrombus Aging +---------+---------------+---------+-----------+----------+--------------+ CFV      Full           Yes      Yes                                 +---------+---------------+---------+-----------+----------+--------------+ SFJ      Full                                                         +---------+---------------+---------+-----------+----------+--------------+ FV Prox  Full                                                        +---------+---------------+---------+-----------+----------+--------------+ FV Mid   Full                                                        +---------+---------------+---------+-----------+----------+--------------+ FV DistalFull                                                        +---------+---------------+---------+-----------+----------+--------------+ PFV  Full                                                        +---------+---------------+---------+-----------+----------+--------------+ POP      Full           Yes      Yes                                 +---------+---------------+---------+-----------+----------+--------------+ PTV      Full                                                        +---------+---------------+---------+-----------+----------+--------------+ PERO     Full                                                        +---------+---------------+---------+-----------+----------+--------------+    Summary: RIGHT: - Findings consistent with age indeterminate deep vein thrombosis involving the right femoral vein. - Portions of this examination were limited- see technologist comments above.  LEFT: - There is no evidence of deep vein thrombosis in the lower extremity.  - No cystic structure found in the popliteal fossa.  *See table(s) above for measurements and observations.    Preliminary         Scheduled Meds:  docusate sodium  100 mg Oral BID   donepezil  10 mg Oral QHS   enoxaparin (LOVENOX) injection  80 mg Subcutaneous Q12H   insulin aspart  0-15 Units Subcutaneous TID WC   insulin glargine-yfgn  10 Units Subcutaneous QHS   lisinopril  5 mg Oral Daily   metroNIDAZOLE  500 mg Oral Q12H   multivitamin with minerals  1 tablet  Oral Daily   Ensure Max Protein  11 oz Oral Daily   Continuous Infusions:  sodium chloride 100 mL/hr at 05/27/21 1603    ceFAZolin (ANCEF) IV 2 g (05/30/21 1348)     LOS: 3 days    Time spent: 35 minutes.     Elmarie Shiley, MD [facility]   If 7PM-7AM, please contact night-coverage [email or web address]  05/30/2021, 2:31 PM

## 2021-05-30 NOTE — Evaluation (Signed)
Occupational Therapy Evaluation Patient Details Name: Scott Garrett MRN: [id number] DOB: [birth year] Today's Date: 05/30/2021   History of Present Illness Patient is a 75 y/o male who presents on 05/26/21 due to groin wound infection. s/p excision of right femoral bypass graft and angioplasty of right CFA and exploration of groin wound 05/26/21. 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 PMH includes dementia, HTN, DM2.   Clinical Impression   Pt plans to return to ALF at d/c, currently requires set up - mod A for ADLs during session. Pt requires max encouragement for bed mobility, min-mod A to sit EOB, pt declined attempting transfers at this time stating he only wants to lay in bed/watch TV, however performed lateral scoot transfer toward [facility] with min-mod A. Pt limited by decreased balance, activity tolerance, and strength at this time. Will continue to follow acutely. Recommend SNF at d/c.     Recommendations for follow up therapy are one component of a multi-disciplinary discharge planning process, led by the attending physician.  Recommendations may be updated based on patient status, additional functional criteria and insurance authorization.   Follow Up Recommendations  Skilled nursing-short term rehab (<3 hours/day)    Assistance Recommended at Discharge Intermittent Supervision/Assistance  Functional Status Assessment  Patient has had a recent decline in their functional status and demonstrates the ability to make significant improvements in function in a reasonable and predictable amount of time.  Equipment Recommendations  Other (comment) (TBD at next venue of care)    Recommendations for Other Services PT consult     Precautions / Restrictions Restrictions Weight Bearing Restrictions: Yes RLE Weight Bearing: Non weight bearing      Mobility Bed Mobility Overal bed mobility: Needs Assistance Bed Mobility: Supine to Sit;Sit to Supine      Supine to sit: Min assist;Mod assist;HOB elevated Sit to supine: Min assist;Mod assist;HOB elevated   General bed mobility comments: max encourgement and cuing needed to move EOB, requesting help scooting hips to EOB    Transfers                   General transfer comment: pt declining to attempt sit to stand/stand pivot transfer at this time, max encouragement needed to perform lateral scoot transfer to move up in bed      Balance Overall balance assessment: Needs assistance Sitting-balance support: Feet supported;Bilateral upper extremity supported Sitting balance-Leahy Scale: Good                                     ADL either performed or assessed with clinical judgement   ADL Overall ADL's : Needs assistance/impaired Eating/Feeding: Set up;Sitting   Grooming: Set up;Sitting   Upper Body Bathing: Set up;Sitting   Lower Body Bathing: Minimal assistance;Sitting/lateral leans   Upper Body Dressing : Minimal assistance;Sitting   Lower Body Dressing: Sitting/lateral leans;Minimal assistance Lower Body Dressing Details (indicate cue type and reason): pt able to reach down to simulate LB dressing while long sitting in bed Toilet Transfer: Moderate assistance;Maximal assistance;+2 for physical assistance;BSC/3in1   Toileting- Clothing Manipulation and Hygiene: Moderate assistance;Sitting/lateral lean       Functional mobility during ADLs: Maximal assistance;+2 for physical assistance       Vision   Vision Assessment?: No apparent visual deficits     Perception     Praxis      Pertinent Vitals/Pain Pain Assessment: Faces Pain Score:  2  Faces Pain Scale: Hurts a little bit Pain Location: generalized Pain Descriptors / Indicators: Discomfort Pain Intervention(s): Limited activity within patient's tolerance;Monitored during session;Repositioned;Premedicated before session     Hand Dominance     Extremity/Trunk Assessment Upper Extremity  Assessment Upper Extremity Assessment: Overall WFL for tasks assessed   Lower Extremity Assessment Lower Extremity Assessment: Defer to PT evaluation       Communication Communication Communication: No difficulties   Cognition Arousal/Alertness: Awake/alert Behavior During Therapy: Agitated Overall Cognitive Status: Difficult to assess                                 General Comments: pt requires increased redirection to task during session, perseverative on not wanting to move and just wanting to watch tv/eat     General Comments       Exercises     Shoulder Instructions      Home Living Family/patient expects to be discharged to:: Assisted living                             Home Equipment: None          Prior Functioning/Environment                          OT Problem List: Decreased strength;Decreased range of motion;Decreased activity tolerance;Impaired balance (sitting and/or standing)      OT Treatment/Interventions: Self-care/ADL training;Therapeutic exercise;Energy conservation;Therapeutic activities;Patient/family education;Balance training    OT Goals(Current goals can be found in the care plan section) Acute Rehab OT Goals Patient Stated Goal: none stated OT Goal Formulation: With patient Time For Goal Achievement: 06/13/21 Potential to Achieve Goals: Good ADL Goals Pt Will Perform Lower Body Dressing: with supervision;sitting/lateral leans Pt Will Transfer to Toilet: with min assist;stand pivot transfer;bedside commode Pt Will Perform Toileting - Clothing Manipulation and hygiene: with min assist;sitting/lateral leans Pt Will Perform Tub/Shower Transfer: with min assist;shower seat  OT Frequency: Min 2X/week   Barriers to D/C:            Co-evaluation PT/OT/SLP Co-Evaluation/Treatment: Yes Reason for Co-Treatment: Complexity of the patient's impairments (multi-system involvement);For patient/therapist  safety;Necessary to address cognition/behavior during functional activity   OT goals addressed during session: ADL's and self-care      AM-PAC OT "6 Clicks" Daily Activity     Outcome Measure Help from another person eating meals?: None Help from another person taking care of personal grooming?: A Little Help from another person toileting, which includes using toliet, bedpan, or urinal?: A Lot Help from another person bathing (including washing, rinsing, drying)?: A Lot Help from another person to put on and taking off regular upper body clothing?: A Little Help from another person to put on and taking off regular lower body clothing?: A Lot 6 Click Score: 16   End of Session Nurse Communication: Mobility status  Activity Tolerance: Patient tolerated treatment well;Treatment limited secondary to agitation;Patient limited by fatigue Patient left: in bed;with call bell/phone within reach;with bed alarm set  OT Visit Diagnosis: Unsteadiness on feet (R26.81);Other abnormalities of gait and mobility (R26.89);Muscle weakness (generalized) (M62.81);Pain                Time: 1100-1115 OT Time Calculation (min): 15 min Charges:  OT General Charges $OT Visit: 1 Visit OT Evaluation $OT Eval Low Complexity: 1 Low  Lynnda Child, OTD,  OTR/L Acute Rehab [phone] - [place] 05/30/2021, 11:37 AM

## 2021-05-30 NOTE — Evaluation (Signed)
Physical Therapy Evaluation Patient Details Name: Scott Garrett MRN: [id number] DOB: 12/15/1945 Today's Date: 05/30/2021  History of Present Illness  Patient is a 75 y/o male who presents on 05/26/21 due to groin wound infection. s/p excision of right femoral bypass graft and angioplasty of right CFA and exploration of groin wound 05/26/21. 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 PMH includes dementia, HTN, DM2.  Clinical Impression  Patient presents with impaired cognition (close to baseline?), generalized weakness , pain and impaired mobility s/p above. Pt not a reliable historian and not able to provide information regarding PLOF/history. Likely needing assist with ADLs/transfers. Requires max encouragement for any mobility, perseverating on wanting to eat and watch TV. Min-Mod A for bed mobility and scooting along side bed. Refusing transfer to w/c. Will follow acutely to maximize independence and mobility prior to d/c; would benefit from return to SNF.     Recommendations for follow up therapy are one component of a multi-disciplinary discharge planning process, led by the attending physician.  Recommendations may be updated based on patient status, additional functional criteria and insurance authorization.  Follow Up Recommendations Skilled nursing-short term rehab (<3 hours/day)    Assistance Recommended at Discharge Frequent or constant Supervision/Assistance  Functional Status Assessment Patient has not had a recent decline in their functional status (unsure)  Equipment Recommendations  None recommended by PT    Recommendations for Other Services       Precautions / Restrictions Precautions Precautions: Fall Precaution Comments: right AKA Restrictions Weight Bearing Restrictions: Yes RLE Weight Bearing: Non weight bearing      Mobility  Bed Mobility Overal bed mobility: Needs Assistance Bed Mobility: Supine to Sit;Sit to Supine      Supine to sit: Min assist;Mod assist;HOB elevated Sit to supine: Min assist;Mod assist;HOB elevated   General bed mobility comments: max encourgement and cuing needed to move EOB, requesting help scooting hips to EOB    Transfers Overall transfer level: Needs assistance Equipment used: None               General transfer comment: pt declining to attempt sit to stand/stand pivot transfer at this time, max encouragement needed to perform lateral scoot transfer to move up in bed. Able to scoot laterally x3 with Min A to prepare for scooting to w/c.    Ambulation/Gait                  Stairs            Wheelchair Mobility    Modified Rankin (Stroke Patients Only)       Balance Overall balance assessment: Needs assistance Sitting-balance support: Feet supported;Bilateral upper extremity supported Sitting balance-Leahy Scale: Good                                       Pertinent Vitals/Pain Pain Assessment: Faces Pain Score: 2  Faces Pain Scale: Hurts a little bit Pain Location: generalized with mobility Pain Descriptors / Indicators: Discomfort Pain Intervention(s): Limited activity within patient's tolerance;Monitored during session;Premedicated before session;Repositioned    Home Living Family/patient expects to be discharged to:: [place]: None Additional Comments: PLOF/history difficult to determine as pt with hx of dementia and not the best historian. From SNF. W/c present in room.    Prior  Function Prior Level of Function : Patient poor historian/Family not available                     Hand Dominance        Extremity/Trunk Assessment   Upper Extremity Assessment Upper Extremity Assessment: Defer to OT evaluation    Lower Extremity Assessment Lower Extremity Assessment: Generalized weakness;Difficult to assess due to impaired cognition       Communication    Communication: No difficulties  Cognition Arousal/Alertness: Awake/alert Behavior During Therapy:  (irritated) Overall Cognitive Status: History of cognitive impairments - at baseline                                 General Comments: pt requires increased redirection to task during session, perseverative on not wanting to move and just wanting to watch tv/eat. Uses humor to deflect answers to questions. LIkely close to baseline, known to therapist from last admission.        General Comments      Exercises     Assessment/Plan    PT Assessment Patient needs continued PT services  PT Problem List Decreased strength;Decreased mobility;Decreased safety awareness;Decreased activity tolerance;Decreased cognition       PT Treatment Interventions Therapeutic activities;Therapeutic exercise;Wheelchair mobility training;Patient/family education;DME instruction;Functional mobility training    PT Goals (Current goals can be found in the Care Plan section)  Acute Rehab PT Goals Patient Stated Goal: to eat some food and watch TV PT Goal Formulation: With patient Time For Goal Achievement: 06/13/21 Potential to Achieve Goals: Fair    Frequency Min 2X/week   Barriers to discharge        Co-evaluation PT/OT/SLP Co-Evaluation/Treatment: Yes Reason for Co-Treatment: Complexity of the patient's impairments (multi-system involvement);For patient/therapist safety;Necessary to address cognition/behavior during functional activity PT goals addressed during session: Mobility/safety with mobility OT goals addressed during session: ADL's and self-care       AM-PAC PT "6 Clicks" Mobility  Outcome Measure Help needed turning from your back to your side while in a flat bed without using bedrails?: A Lot Help needed moving from lying on your back to sitting on the side of a flat bed without using bedrails?: A Lot Help needed moving to and from a bed to a chair (including a  wheelchair)?: A Lot Help needed standing up from a chair using your arms (e.g., wheelchair or bedside chair)?: Total Help needed to walk in hospital room?: Total Help needed climbing 3-5 steps with a railing? : Total 6 Click Score: 9    End of Session   Activity Tolerance: Patient limited by fatigue;Other (comment) (self limiting, cognition) Patient left: in bed;with call bell/phone within reach;with bed alarm set Nurse Communication: Mobility status PT Visit Diagnosis: Muscle weakness (generalized) (M62.81)    Time: 1100-1116 PT Time Calculation (min) (ACUTE ONLY): 16 min   Charges:   PT Evaluation $PT Eval Moderate Complexity: 1 Mod          Marisa Severin, PT, DPT Acute Rehabilitation Services Pager [phone] Office [place] 05/30/2021, 12:19 PM

## 2021-05-30 NOTE — Progress Notes (Addendum)
IP PROGRESS NOTE  Subjective:   Scott Garrett is known to me with a recent diagnosis of a T-cell lymphoproliferative disorder.  He is asymptomatic from the lymphoproliferative disorder and has not received treatment. He was admitted on 05/24/2021 with a wound hematoma and infection.  He underwent excision of the right femoral bypass graft, harvest of the right greater saphenous vein and a right common femoral artery vein patch angioplasty on 05/26/2021.  Scott Garrett is noted to have persistent marked leukocytosis.  He has developed severe anemia following surgery.  He has no complaint today except for feeling "hungry ".  Objective: Vital signs in last 24 hours: Blood pressure 135/66, pulse 69, temperature 98.3 F (36.8 C), temperature source Oral, resp. rate 18, height 5' 11"  (1.803 m), weight 176 lb 9.4 oz (80.1 kg), SpO2 100 %.  Intake/Output from previous day: 12/05 0701 - 12/06 0700 In: 1280 [P.O.:1180; IV Piggyback:100] Out: -   Physical Exam:  HEENT: No thrush Lungs: Clear anteriorly Cardiac: Regular rate and rhythm Abdomen: No hepatosplenomegaly Extremities: No left leg edema Lymph nodes: Less than 1 cm right greater than left cervical/supraclavicular nodes, bilateral 1-2 cm axillary nodes, 1 cm left inguinal nodes Skin: Bloody bandage at the right thigh    Lab Results: Recent Labs    05/29/21 0131 05/30/21 0105  WBC 159.6* 168.2*  HGB 7.6* 7.7*  HCT 24.5* 25.0*  PLT 81* 86*    BMET Recent Labs    05/28/21 0054 05/29/21 0131  NA 137 137  K 4.8 4.9  CL 110 109  CO2 22 25  GLUCOSE 160* 129*  BUN 19 18  CREATININE 0.80 0.85  CALCIUM 8.0* 8.0*    No results found for: CEA1, CEA, CAN199, CA125  Studies/Results: VAS [place] LOWER EXTREMITY VENOUS (DVT)  Result Date: 05/30/2021  Lower Venous DVT Study Patient Name:  Scott Garrett  Date of Exam:   05/30/2021 Medical Rec #: [id number]       Accession #:    [id number] Date of Birth: August 10, 1945       Patient Gender: M  Patient Age:   75 years Exam Location:  [facility] Procedure:      VAS [place] LOWER EXTREMITY VENOUS (DVT) Referring Phys: Jerald Kief REGALADO --------------------------------------------------------------------------------  Indications: Edema.  Limitations: Bandages and staples Comparison Study: dvt noted during thrombectomy 10/17 and during arterial duplex                   study 10/30. Performing Technologist: Archie Patten RVS  Examination Guidelines: A complete evaluation includes B-mode imaging, spectral Doppler, color Doppler, and power Doppler as needed of all accessible portions of each vessel. Bilateral testing is considered an integral part of a complete examination. Limited examinations for reoccurring indications may be performed as noted. The reflux portion of the exam is performed with the patient in reverse Trendelenburg.  +--------+---------------+---------+-----------+----------+--------------------+ RIGHT   CompressibilityPhasicitySpontaneityPropertiesThrombus Aging       +--------+---------------+---------+-----------+----------+--------------------+ CFV                                                  patent by color  doppler              +--------+---------------+---------+-----------+----------+--------------------+ SFJ                                                  Not well visualized  +--------+---------------+---------+-----------+----------+--------------------+ FV Prox                                              Not well visualized  +--------+---------------+---------+-----------+----------+--------------------+ FV Mid  None           No       No                   Age Indeterminate    +--------+---------------+---------+-----------+----------+--------------------+ FV      None           No       No                   Age Indeterminate    Distal                                                                     +--------+---------------+---------+-----------+----------+--------------------+   +---------+---------------+---------+-----------+----------+--------------+ LEFT     CompressibilityPhasicitySpontaneityPropertiesThrombus Aging +---------+---------------+---------+-----------+----------+--------------+ CFV      Full           Yes      Yes                                 +---------+---------------+---------+-----------+----------+--------------+ SFJ      Full                                                        +---------+---------------+---------+-----------+----------+--------------+ FV Prox  Full                                                        +---------+---------------+---------+-----------+----------+--------------+ FV Mid   Full                                                        +---------+---------------+---------+-----------+----------+--------------+ FV DistalFull                                                        +---------+---------------+---------+-----------+----------+--------------+ PFV  Full                                                        +---------+---------------+---------+-----------+----------+--------------+ POP      Full           Yes      Yes                                 +---------+---------------+---------+-----------+----------+--------------+ PTV      Full                                                        +---------+---------------+---------+-----------+----------+--------------+ PERO     Full                                                        +---------+---------------+---------+-----------+----------+--------------+    Summary: RIGHT: - Findings consistent with age indeterminate deep vein thrombosis involving the right femoral vein. - Portions of this examination were limited- see technologist comments above.  LEFT: - There is no evidence of  deep vein thrombosis in the lower extremity.  - No cystic structure found in the popliteal fossa.  *See table(s) above for measurements and observations.    Preliminary     Medications: I have reviewed the patient's current medications.  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  Normocytic anemia-secondary to T-cell lymphoproliferative disorder and bleeding 3.  Mild thrombocytopenia 4.  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  Dementia 6.  Diabetes mellitus 7.  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  Tobacco dependence 10.  Alcohol use 11.  Admission 05/24/2021 with infection of the right femoral bypass graft-Harvest of right greater saphenous vein and right common femoral artery vein patch angioplasty 05/26/2021 12. Thrombosis of the right popliteal vein, femoral vein at the time of surgery on 04/11/2021 Lovenox started 05/29/2021   Scott Garrett is recovering from repeat surgery at the right leg.  He underwent excision of the right femoral bypass graft and placement of the right common femoral artery vein patch angioplasty on 05/26/2021.  He has developed progressive anemia during this hospital admission, likely secondary to surgical blood loss.  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  He had mild anemia and thrombocytopenia and was asymptomatic from the T-cell disorder prior to this hospital admission.  I met with he and his daughter last month and the plan was to continue  observation unless he developed progressive anemia/thrombocytopenia or new symptoms.  Recommendations: Continue postoperative care per the vascular surgery service Transfuse packed red blood cells for symptomatic anemia Outpatient follow-[facility] will [facility] Anticoagulation  therapy per the vascular service   LOS: 3 days   Betsy Coder, MD   05/30/2021, 1:26 PM

## 2021-05-31 ENCOUNTER — Inpatient Hospital Stay: Payer: Medicare (Managed Care) | Admitting: Nurse Practitioner

## 2021-05-31 ENCOUNTER — Inpatient Hospital Stay: Payer: Medicare (Managed Care)

## 2021-05-31 DIAGNOSIS — F03918 Unspecified dementia, unspecified severity, with other behavioral disturbance: Secondary | ICD-10-CM | POA: Diagnosis not present

## 2021-05-31 DIAGNOSIS — I10 Essential (primary) hypertension: Secondary | ICD-10-CM

## 2021-05-31 DIAGNOSIS — E113399 Type 2 diabetes mellitus with moderate nonproliferative diabetic retinopathy without macular edema, unspecified eye: Secondary | ICD-10-CM

## 2021-05-31 DIAGNOSIS — I70221 Atherosclerosis of native arteries of extremities with rest pain, right leg: Secondary | ICD-10-CM | POA: Diagnosis not present

## 2021-05-31 DIAGNOSIS — D479 Neoplasm of uncertain behavior of lymphoid, hematopoietic and related tissue, unspecified: Secondary | ICD-10-CM

## 2021-05-31 DIAGNOSIS — T8149XA Infection following a procedure, other surgical site, initial encounter: Secondary | ICD-10-CM | POA: Diagnosis not present

## 2021-05-31 LAB — GLUCOSE, CAPILLARY
Glucose-Capillary: 97 mg/dL (ref 70–99)
Glucose-Capillary: 179 mg/dL — ABNORMAL HIGH (ref 70–99)
Glucose-Capillary: 188 mg/dL — ABNORMAL HIGH (ref 70–99)
Glucose-Capillary: 139 mg/dL — ABNORMAL HIGH (ref 70–99)

## 2021-05-31 MED ORDER — OXYCODONE HCL 5 MG PO TABS: 5.0000 mg | ORAL_TABLET | Freq: Four times a day (QID) | ORAL | 0 refills | Status: AC | PRN

## 2021-05-31 MED ORDER — SENNOSIDES-DOCUSATE SODIUM 8.6-50 MG PO TABS: 1.0000 | ORAL_TABLET | Freq: Two times a day (BID) | ORAL | 0 refills | Status: AC | PRN

## 2021-05-31 MED ORDER — ENSURE MAX PROTEIN PO LIQD: 11.0000 [oz_av] | Freq: Every day | ORAL | Status: AC

## 2021-05-31 MED ORDER — SODIUM CHLORIDE 0.9 % IV SOLN
3.0000 g | Freq: Four times a day (QID) | INTRAVENOUS | Status: DC
  Administered 2021-05-31 – 2021-06-01 (×5): 3 g via INTRAVENOUS
  Filled 2021-05-31 (×5): qty 8

## 2021-05-31 MED ORDER — AMOXICILLIN-POT CLAVULANATE 875-125 MG PO TABS: 1.0000 | ORAL_TABLET | Freq: Two times a day (BID) | ORAL | 0 refills | Status: AC

## 2021-05-31 MED ORDER — APIXABAN (ELIQUIS) VTE STARTER PACK (10MG AND 5MG): ORAL_TABLET | ORAL | 0 refills | Status: DC

## 2021-05-31 MED ORDER — ATORVASTATIN CALCIUM 20 MG PO TABS: 20.0000 mg | ORAL_TABLET | Freq: Every day | ORAL | 11 refills | Status: AC

## 2021-05-31 MED ORDER — ACETAMINOPHEN 325 MG PO TABS: 650.0000 mg | ORAL_TABLET | Freq: Four times a day (QID) | ORAL | Status: AC | PRN

## 2021-05-31 NOTE — Discharge Summary (Signed)
Physician Discharge Summary  Jasmin Trumbull WPY:[id number] DOB: 1946-01-06 DOA: 05/26/2021  PCP: [name]  Admit date: 05/26/2021 Discharge date: 05/31/2021 Admitted From: Home Disposition:  SNF Recommendations for Outpatient Follow-up:  Follow ups as below. Please obtain CBC/BMP/Mag at follow up Please follow up on the following pending results: None  Discharge Condition: Stable CODE STATUS: Full code  Contact information for follow-up providers     Waynetta Sandy, MD. Schedule an appointment as soon as possible for a visit in 1 week(s).   Specialties: Vascular Surgery, Cardiology Contact information: [address] [place] [address] [phone]              Contact information for after-discharge care     Destination     [facility] Preferred SNF .   Service: Skilled Nursing Contact information: [address] [place] Hospital Course: 75 year old with past medical history significant for dementia, diabetes, hypertension, T-cell lymphoproliferative disorder, PVD status post AKA presents with groin wound infection.  Previously admitted 10/17 until 11/3 with right lower extremity ischemia.  He underwent attempted thrombectomy and multiple he required bypass and right AKA.  Right groin surgical wound is open and he was seen in the clinic 11/30 with ultrasound showing residual fluid collection versus organized hematoma, patient was arranged to go to the OR 12/2 for wound exploration.   Patient underwent excision of the right femoral bypass graft and patch angioplasty of the right CFA using right GSV on 05/26/2021.Marland Kitchen  He was a started on IV antibiotics, IV Ancef and IV Flagyl.  He is discharged on p.o. Augmentin for 7 more days.  Vascular surgery recommends dry dressing changes BID or as needed to right groin.  Patient has a known right femoral DVT.  He was not on  anticoagulation as an outpatient.  Case discussed with [name] with vascular and Dr. Learta Codding okay to proceed with  anticoagulation for DVT.  He was a started on IV heparin and discharged on p.o. Eliquis.    Therapy recommended SNF.  See individual problem list below for more on hospital course.  Discharge Diagnoses:  Infected right femoral bypass graft; Groin wound infection: -Concern for right femoral bypass graft infection and so it was partially removed. -Underwent excision of right femoral bypass graft partial.  -IV Ancef and IV Flagyl 12/2-12/6>> p.o. Augmentin for 7 more days -Outpatient follow-up with vascular surgery in 1 week   PVD with right AKA: -S/P recent AKA>  -Per vascular.    T-cell lymphoma: Persistent leukocytosis. follow-up with Dr. Learta Codding, oncology. 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 -Discharged on Eliquis   Dementia with behavioral disturbance: -Continue with Aricept, delirium precaution. -Reorientation and delirium precautions.   Uncontrolled DM-2 with hyperglycemia: A1c 11%. Recent Labs  Lab 05/30/21 0906 05/30/21 1222 05/30/21 1615 05/30/21 2110 05/31/21 0803  GLUCAP 190* 171* 150* 93 97  -Continue home Lantus 10 units daily -Resume home metformin   Hypertension: Normotensive. -On lisinopril.   Body mass index is 24.63 kg/m. Nutrition Problem: Increased nutrient needs Etiology: wound healing Signs/Symptoms: estimated needs Interventions: MVI, Premier Protein     Discharge Exam: Vitals:   05/30/21 0527 05/30/21 1613 05/30/21 2108 05/31/21 0508  BP: 135/66 (!) 112/55 (!) 119/57 131/71  Pulse: 69 60 70 63  Temp: 98.3 F (36.8 C) 98.3 F (  36.8 C) 98.1 F (36.7 C) 98.7 F (37.1 C)  Resp:  20 20   Height:      Weight:      SpO2: 100% 100% 100% 100%  TempSrc: Oral Oral Oral Oral  BMI (Calculated):         GENERAL: No apparent distress.  Nontoxic. HEENT: MMM.  Vision and hearing  grossly intact.  NECK: Supple.  No apparent JVD.  RESP: 100% on RA.  No IWOB.  Fair aeration bilaterally. CVS:  RRR. Heart sounds normal.  ABD/GI/GU: Bowel sounds present. Soft. Non tender.  MSK/EXT: Right AKA.  Dressing over right groin DCI. SKIN: no apparent skin lesion or wound NEURO: Awake and alert.  Oriented fairly.  No apparent focal neuro deficit. PSYCH: Calm.  Looks upset about condom cath leaking.  Discharge Instructions  Discharge Instructions     Diet - low sodium heart healthy   Complete by: As directed    Diet Carb Modified   Complete by: As directed    Discharge wound care:   Complete by: As directed    Dry dressing changes BID or as needed to right groin   Increase activity slowly   Complete by: As directed       Allergies as of 05/31/2021   No Known Allergies      Medication List     STOP taking these medications    insulin lispro 100 UNIT/ML KwikPen Commonly known as: HUMALOG       TAKE these medications    acetaminophen 325 MG tablet Commonly known as: TYLENOL Take 2 tablets (650 mg total) by mouth every 6 (six) hours as needed for mild pain (or Fever >/= 101).   amoxicillin-clavulanate 875-125 MG tablet Commonly known as: Augmentin Take 1 tablet by mouth 2 (two) times daily for 7 days.   Apixaban Starter Pack (10mg  and 5mg ) Commonly known as: ELIQUIS STARTER PACK Take as directed on package: start with two-5mg  tablets twice daily for 7 days. On day 8, switch to one-5mg  tablet twice daily.   atorvastatin 20 MG tablet Commonly known as: Lipitor Take 1 tablet (20 mg total) by mouth daily.   Cholecalciferol 25 MCG (1000 UT) Tbdp Take 1,000 Units by mouth in the morning.   donepezil 10 MG tablet Commonly known as: ARICEPT Take 10 mg by mouth at bedtime.   Ensure Max Protein Liqd Take 330 mLs (11 oz total) by mouth daily.   insulin glargine 100 UNIT/ML injection Commonly known as: LANTUS Inject 10 Units into the skin at bedtime.    lisinopril 5 MG tablet Commonly known as: ZESTRIL Take 5 mg by mouth daily.   metFORMIN 500 MG tablet Commonly known as: GLUCOPHAGE Take 500 mg by mouth 2 (two) times daily with a meal.   oxyCODONE 5 MG immediate release tablet Commonly known as: Oxy IR/ROXICODONE Take 1 tablet (5 mg total) by mouth every 6 (six) hours as needed for up to 5 days for moderate pain or severe pain.   PROSTAT PO Take 30 mLs by mouth 3 (three) times daily.   senna-docusate 8.6-50 MG tablet Commonly known as: Senokot-S Take 1 tablet by mouth 2 (two) times daily between meals as needed for mild constipation.               Discharge Care Instructions  (From admission, onward)           Start     Ordered   05/31/21 0000  Discharge wound care:  Comments: Dry dressing changes BID or as needed to right groin   05/31/21 1237            Consultations: Vascular surgery  Procedures/Studies: Excision of the right femoral bypass graft and patch angioplasty of the right CFA using right GSV on 05/26/2021.Marland Kitchen   VAS [place] LOWER EXTREMITY VENOUS (DVT)  Result Date: 05/30/2021  Lower Venous DVT Study Patient Name:  TRIG MCBRYAR  Date of Exam:   05/30/2021 Medical Rec #: [id number]       Accession #:    [id number] Date of Birth: Jan 15, 1946       Patient Gender: M Patient Age:   3 years Exam Location:  [facility] Procedure:      VAS [place] LOWER EXTREMITY VENOUS (DVT) Referring Phys: Jerald Kief REGALADO --------------------------------------------------------------------------------  Indications: Edema.  Limitations: Bandages and staples Comparison Study: dvt noted during thrombectomy 10/17 and during arterial duplex                   study 10/30. Performing Technologist: Archie Patten RVS  Examination Guidelines: A complete evaluation includes B-mode imaging, spectral Doppler, color Doppler, and power Doppler as needed of all accessible portions of each vessel. Bilateral testing is considered an  integral part of a complete examination. Limited examinations for reoccurring indications may be performed as noted. The reflux portion of the exam is performed with the patient in reverse Trendelenburg.  +--------+---------------+---------+-----------+----------+--------------------+ RIGHT   CompressibilityPhasicitySpontaneityPropertiesThrombus Aging       +--------+---------------+---------+-----------+----------+--------------------+ CFV                    Yes      Yes                  patent by color                                                           doppler              +--------+---------------+---------+-----------+----------+--------------------+ SFJ                                                  Not well visualized  +--------+---------------+---------+-----------+----------+--------------------+ FV Prox                                              Not well visualized  +--------+---------------+---------+-----------+----------+--------------------+ FV Mid  None           No       No                   Age Indeterminate    +--------+---------------+---------+-----------+----------+--------------------+ FV      None           No       No                   Age Indeterminate    Distal                                                                    +--------+---------------+---------+-----------+----------+--------------------+   +---------+---------------+---------+-----------+----------+--------------+  LEFT     CompressibilityPhasicitySpontaneityPropertiesThrombus Aging +---------+---------------+---------+-----------+----------+--------------+ CFV      Full           Yes      Yes                                 +---------+---------------+---------+-----------+----------+--------------+ SFJ      Full                                                         +---------+---------------+---------+-----------+----------+--------------+ FV Prox  Full                                                        +---------+---------------+---------+-----------+----------+--------------+ FV Mid   Full                                                        +---------+---------------+---------+-----------+----------+--------------+ FV DistalFull                                                        +---------+---------------+---------+-----------+----------+--------------+ PFV      Full                                                        +---------+---------------+---------+-----------+----------+--------------+ POP      Full           Yes      Yes                                 +---------+---------------+---------+-----------+----------+--------------+ PTV      Full                                                        +---------+---------------+---------+-----------+----------+--------------+ PERO     Full                                                        +---------+---------------+---------+-----------+----------+--------------+     Summary: RIGHT: - Findings consistent with age indeterminate deep vein thrombosis involving the right femoral vein. - Portions of this examination were limited- see technologist comments above.  LEFT: - There is no evidence of deep vein thrombosis in the lower extremity.  -  No cystic structure found in the popliteal fossa.  *See table(s) above for measurements and observations. Electronically signed by Deitra Mayo MD on 05/30/2021 at 2:32:38 PM.    Final        The results of significant diagnostics from this hospitalization (including imaging, microbiology, ancillary and laboratory) are listed below for reference.     Microbiology: Recent Results (from the past 240 hour(s))  SARS Coronavirus 2 by RT PCR (hospital order, performed in [facility] hospital lab) Nasopharyngeal  Nasopharyngeal Swab     Status: None   Collection Time: 05/26/21  6:53 AM   Specimen: Nasopharyngeal Swab  Result Value Ref Range Status   SARS Coronavirus 2 NEGATIVE NEGATIVE Final    Comment: (NOTE) SARS-CoV-2 target nucleic acids are NOT DETECTED.  The SARS-CoV-2 RNA is generally detectable in upper and lower respiratory specimens during the acute phase of infection. The lowest concentration of SARS-CoV-2 viral copies this assay can detect is 250 copies / mL. A negative result does not preclude SARS-CoV-2 infection and should not be used as the sole basis for treatment or other patient management decisions.  A negative result may occur with improper specimen collection / handling, submission of specimen other than nasopharyngeal swab, presence of viral mutation(s) within the areas targeted by this assay, and inadequate number of viral copies (<250 copies / mL). A negative result must be combined with clinical observations, patient history, and epidemiological information.  Fact Sheet for Patients:   StrictlyIdeas.no  Fact Sheet for Healthcare Providers: BankingDealers.co.za  This test is not yet approved or  cleared by the [place] FDA and has been authorized for detection and/or diagnosis of SARS-CoV-2 by FDA under an Emergency Use Authorization (EUA).  This EUA will remain in effect (meaning this test can be used) for the duration of the COVID-19 declaration under Section 564(b)(1) of the Act, 21 U.S.C. section 360bbb-3(b)(1), unless the authorization is terminated or revoked sooner.  Performed at [facility], [place] [address], [place], [place] [address]   Surgical pcr screen     Status: None   Collection Time: 05/26/21  8:32 AM   Specimen: Nasal Mucosa; Nasal Swab  Result Value Ref Range Status   MRSA, PCR NEGATIVE NEGATIVE Final   Staphylococcus aureus NEGATIVE NEGATIVE Final    Comment: (NOTE) The Xpert SA  Assay (FDA approved for NASAL specimens in patients 80 years of age and older), is one component of a comprehensive surveillance program. It is not intended to diagnose infection nor to guide or monitor treatment. Performed at [facility], [place] [address], [place], [place] [address]      Labs:  CBC: Recent Labs  Lab 05/26/21 [id number] 05/27/21 0318 05/28/21 0054 05/29/21 0131 05/30/21 0105  WBC 128.3* 151.6* 126.6* 159.6* 168.2*  NEUTROABS  --   --  4.9  --   --   HGB 10.7* 8.6* 8.4* 7.6* 7.7*  HCT 34.2* 27.6* 26.4* 24.5* 25.0*  MCV 96.6 94.5 94.6 95.3 95.1  PLT 87* 87* 84* 81* 86*   BMP &GFR Recent Labs  Lab 05/26/21 0917 05/27/21 0318 05/28/21 0054 05/29/21 0131  NA 141 135 137 137  K 4.6 4.7 4.8 4.9  CL 111 106 110 109  CO2 25 23 22 25   GLUCOSE 113* 234* 160* 129*  BUN 24* 23 19 18   CREATININE 0.81 0.97 0.80 0.85  CALCIUM 8.6* 8.3* 8.0* 8.0*   Estimated Creatinine Clearance: 81.2 mL/min (by C-G formula based on SCr of 0.85  mg/dL). Liver & Pancreas: Recent Labs  Lab 05/26/21 0917  AST 29  ALT 28  ALKPHOS 110  BILITOT 0.7  PROT 5.6*  ALBUMIN 2.9*   No results for input(s): LIPASE, AMYLASE in the last 168 hours. No results for input(s): AMMONIA in the last 168 hours. Diabetic: No results for input(s): HGBA1C in the last 72 hours. Recent Labs  Lab 05/30/21 0906 05/30/21 1222 05/30/21 1615 05/30/21 2110 05/31/21 0803  GLUCAP 190* 171* 150* 93 97   Cardiac Enzymes: No results for input(s): CKTOTAL, CKMB, CKMBINDEX, TROPONINI in the last 168 hours. No results for input(s): PROBNP in the last 8760 hours. Coagulation Profile: Recent Labs  Lab 05/26/21 0758 05/26/21 0918  INR DISREGARD RESULTS.  SPECIMEN IS HEMOLYZED.  NOTIFIED HALEY BOWMAN,RN AT 9983 05/26/2021 BY ZBEECH. 1.1   Thyroid Function Tests: No results for input(s): TSH, T4TOTAL, FREET4, T3FREE, THYROIDAB in the last 72 hours. Lipid Profile: No results for input(s): CHOL, HDL,  LDLCALC, TRIG, CHOLHDL, LDLDIRECT in the last 72 hours. Anemia Panel: No results for input(s): VITAMINB12, FOLATE, FERRITIN, TIBC, IRON, RETICCTPCT in the last 72 hours. Urine analysis:    Component Value Date/Time   COLORURINE YELLOW 04/11/2021 0328   APPEARANCEUR CLOUDY (A) 04/11/2021 0328   LABSPEC 1.012 04/11/2021 0328   PHURINE 5.0 04/11/2021 0328   GLUCOSEU 50 (A) 04/11/2021 0328   HGBUR MODERATE (A) 04/11/2021 0328   BILIRUBINUR NEGATIVE 04/11/2021 0328   KETONESUR 5 (A) 04/11/2021 0328   PROTEINUR 30 (A) 04/11/2021 0328   NITRITE NEGATIVE 04/11/2021 0328   LEUKOCYTESUR LARGE (A) 04/11/2021 0328   Sepsis Labs: Invalid input(s): PROCALCITONIN, LACTICIDVEN   Time coordinating discharge: 55 minutes  SIGNED:  Mercy Riding, MD  [facility] 05/31/2021, 12:37 PM

## 2021-05-31 NOTE — TOC Progression Note (Addendum)
Transition of Care [facility]) - Progression Note    Patient Details  Name: Scott Garrett MRN: [id number] Date of Birth: 1946/02/07  Transition of [facility]) CM/SW Contact  Emeterio Reeve, [place] Phone Number: 05/31/2021, 10:08 AM  Clinical Narrative:     [facility] will start insurance auth today for pt to return for SNF. Admissions coordinator will start insurance auth for [facility]. This insurance is not listed in pts chart. [facility] can admit pt when Josem Kaufmann is received. CSW requested covid test from MD.  Expected Discharge Plan: [place] Barriers to Discharge: Continued Medical Work up, Ship broker  Expected Discharge Plan and Services Expected Discharge Plan: [place] Choice: Scraper arrangements for the past 2 months: Apartment                                       Social Determinants of Health (SDOH) Interventions    Readmission Risk Interventions No flowsheet data found.  Emeterio Reeve, LCSW Clinical Social Worker

## 2021-05-31 NOTE — Progress Notes (Addendum)
[facility] [place]   Assessment/Planning: POD # 4 1.  Excision of right femoral bypass graft (partial) 2.  Harvest right greater saphenous vein 3.  Right common femoral artery vein patch angioplasty    SS drainage appears to be decreasing with time Dry dressing changes BID or as needed to right groin DVT:    Summary:  RIGHT:  - Findings consistent with age indeterminate deep vein thrombosis  involving the right femoral vein.  - Portions of this examination were limited- see technologist comments  above.     LEFT:  - There is no evidence of deep vein thrombosis in the lower extremity.     - No cystic structure found in the popliteal fossa.  Now on Lovenox for treatment.  He will need PO anticoagulation  at discharge Leukocytosis followed by Oncology currently on Ancef IV and Flagyl    Subjective  - No new complaints   Objective 131/71 63 98.7 F (37.1 C) (Oral) 20 100%  Intake/Output Summary (Last 24 hours) at 05/31/2021 0713 Last data filed at 05/30/2021 2200 Gross per 24 hour  Intake 1280 ml  Output --  Net 1280 ml    Right stump warm incision well healed, dry dressing change to right groin drainage appears to be decreasing SS.  No frank bleeding Lungs non labored breathing    Roxy Horseman 05/31/2021 7:13 AM --  Laboratory Lab Results: Recent Labs    05/29/21 0131 05/30/21 0105  WBC 159.6* 168.2*  HGB 7.6* 7.7*  HCT 24.5* 25.0*  PLT 81* 86*   BMET Recent Labs    05/29/21 0131  NA 137  K 4.9  CL 109  CO2 25  GLUCOSE 129*  BUN 18  CREATININE 0.85  CALCIUM 8.0*    COAG Lab Results  Component Value Date   INR 1.1 05/26/2021   INR  05/26/2021    DISREGARD RESULTS.  SPECIMEN IS HEMOLYZED.  NOTIFIED HALEY BOWMAN,RN AT 6803 05/26/2021 BY ZBEECH.   No results found for: PTT  I have independently interviewed and examined patient and agree with PA assessment and plan above.   Azarria Balint C. Donzetta Matters,  MD [facility] [place] Office: [phone] Pager: [phone]

## 2021-05-31 NOTE — Care Management (Signed)
Consult for cost of Eliquis.   30 day free card placed on patient's chart.   Patient's PCP is DR Windy Fast at [facility].  Called [place] spoke to [place] , was asked to fax discharge summary to Dr Windy Fast at [phone]. Same done.

## 2021-06-01 ENCOUNTER — Telehealth: Payer: Self-pay | Admitting: Oncology

## 2021-06-01 DIAGNOSIS — E113399 Type 2 diabetes mellitus with moderate nonproliferative diabetic retinopathy without macular edema, unspecified eye: Secondary | ICD-10-CM | POA: Diagnosis not present

## 2021-06-01 DIAGNOSIS — T8149XA Infection following a procedure, other surgical site, initial encounter: Secondary | ICD-10-CM | POA: Diagnosis not present

## 2021-06-01 DIAGNOSIS — I70221 Atherosclerosis of native arteries of extremities with rest pain, right leg: Secondary | ICD-10-CM | POA: Diagnosis not present

## 2021-06-01 DIAGNOSIS — F03918 Unspecified dementia, unspecified severity, with other behavioral disturbance: Secondary | ICD-10-CM | POA: Diagnosis not present

## 2021-06-01 LAB — GLUCOSE, CAPILLARY
Glucose-Capillary: 91 mg/dL (ref 70–99)
Glucose-Capillary: 154 mg/dL — ABNORMAL HIGH (ref 70–99)
Glucose-Capillary: 141 mg/dL — ABNORMAL HIGH (ref 70–99)
Glucose-Capillary: 128 mg/dL — ABNORMAL HIGH (ref 70–99)

## 2021-06-01 LAB — RESP PANEL BY RT-PCR (FLU A&B, COVID) ARPGX2
Influenza A by PCR: NEGATIVE
Influenza B by PCR: NEGATIVE
SARS Coronavirus 2 by RT PCR: NEGATIVE

## 2021-06-01 MED ORDER — APIXABAN 5 MG PO TABS: 5.0000 mg | ORAL_TABLET | Freq: Two times a day (BID) | ORAL | Status: AC

## 2021-06-01 MED ORDER — AMOXICILLIN-POT CLAVULANATE 875-125 MG PO TABS
1.0000 | ORAL_TABLET | Freq: Two times a day (BID) | ORAL | Status: DC
  Administered 2021-06-01: 1 via ORAL
  Filled 2021-06-01: qty 1

## 2021-06-01 MED ORDER — ADULT MULTIVITAMIN W/MINERALS CH: 1.0000 | ORAL_TABLET | Freq: Every day | ORAL | Status: AC

## 2021-06-01 NOTE — Discharge Planning (Signed)
Oncology Discharge Planning Note  [facility] Address: [address] [place], [place], [place] [address] Hours of Operation:  Nena Polio, Monday - Friday  Clinic Contact Information:  [phone]) [phone]  Oncology Care Team: Medical Oncologist:  Dr. Betsy Coder  Patient Details: Name:  Scott Garrett, Scott Garrett MRN:   [id number] DOB:   [birth year] Reason for Current Admission: Wound infection after surgery  Discharge Planning Narrative: Notification of admission noted on 05/29/21 for [facility].  Discharge follow-up appointments for oncology are in process of being scheduled. SNF will be notified of appointments with Dr. Benay Spice for 1-2 weeks w/CBC [facility] SNF will be called within two business days after discharge to review hematology/oncology's plan of care for full understanding.    Outpatient Oncology Specific Care Only: Oncology appointment transportation needs addressed?:  no Oncology medication management for symptom management addressed?:  no Chemo Alert Card reviewed?:  not applicable Immunotherapy Alert Card reviewed?:  not applicable

## 2021-06-01 NOTE — Telephone Encounter (Signed)
Spoke with facility [facility]) to advise of patients appts on 12/23. Per Sch Message 12/8.

## 2021-06-01 NOTE — Progress Notes (Addendum)
  Progress Note    06/01/2021 7:39 AM 6 Days Post-Op  Subjective:  no complaints   Vitals:   05/31/21 2200 06/01/21 0511  BP: (!) 110/56 133/66  Pulse: 61 65  Resp: 20 18  Temp: 98.3 F (36.8 C) 98.6 F (37 C)  SpO2: 98% 96%   Physical Exam: Cardiac:  regular Lungs:  non labored Incisions:  right groin incisions staples intact. SS drainage from central aspect of incision. Dry gauze dressings applied Extremities:  Right AKA healed nicely Abdomen:  soft Neurologic: alert and oriented  CBC    Component Value Date/Time   WBC 168.2 (HH) 05/30/2021 0105   RBC 2.63 (L) 05/30/2021 0105   HGB 7.7 (L) 05/30/2021 0105   HGB 10.3 (L) 05/11/2021 0816   HCT 25.0 (L) 05/30/2021 0105   PLT 86 (L) 05/30/2021 0105   PLT 80 (L) 05/11/2021 0816   MCV 95.1 05/30/2021 0105   MCH 29.3 05/30/2021 0105   MCHC 30.8 05/30/2021 0105   RDW 14.8 05/30/2021 0105   LYMPHSABS 111.8 (H) 05/28/2021 0054   MONOABS 9.1 (H) 05/28/2021 0054   EOSABS 0.1 05/28/2021 0054   BASOSABS 0.4 (H) 05/28/2021 0054    BMET    Component Value Date/Time   NA 137 05/29/2021 0131   K 4.9 05/29/2021 0131   CL 109 05/29/2021 0131   CO2 25 05/29/2021 0131   GLUCOSE 129 (H) 05/29/2021 0131   BUN 18 05/29/2021 0131   CREATININE 0.85 05/29/2021 0131   CALCIUM 8.0 (L) 05/29/2021 0131   GFRNONAA >60 05/29/2021 0131    INR    Component Value Date/Time   INR 1.1 05/26/2021 0918     Intake/Output Summary (Last 24 hours) at 06/01/2021 0739 Last data filed at 06/01/2021 0308 Gross per 24 hour  Intake 440 ml  Output --  Net 440 ml     Assessment/Plan:  75 y.o. male is s/p Excision of right femoral bypass graft, harvest right greater saphenous vein, right common femoral artery vein patch angioplasty 6 Days Post-Op   Continues to have serosanguinous drainage from right groin Staples intact. Keep dry gauze in right groin. Change dressings BID or as needed On Eliquis for DVT He will follow up in 1-2 weeks  for incision check D/c to SNF today   Karoline Caldwell, [facility] [phone] 06/01/2021 7:39 AM  I have independently interviewed and examined patient and agree with PA assessment and plan above.   Sheryl Towell C. Donzetta Matters, MD [facility] [place] Office: [phone] Pager: [phone]

## 2021-06-01 NOTE — Progress Notes (Signed)
Occupational Therapy Treatment Patient Details Name: Scott Garrett MRN: [id number] DOB: April 18, 1946 Today's Date: 06/01/2021   History of present illness Patient is a 75 y/o male who presents on 05/26/21 due to groin wound infection. s/p excision of right femoral bypass graft and angioplasty of right CFA and exploration of groin wound 05/26/21. 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 PMH includes dementia, HTN, DM2.   OT comments  Pt in bed upon arrival. Pt seemed irritated, wants to leave hospital, fixated on food and not knowing how to use his room phone or call light. Pt required encouragement to sit EOB, refused SPTs. Pt participated in simulated LB ADLs and toileting tasks. OT educated pt on how to use his room phone and call light to ask for help and to control TV. OT will continue to follow acutely to maximize level of function and safety   Recommendations for follow up therapy are one component of a multi-disciplinary discharge planning process, led by the attending physician.  Recommendations may be updated based on patient status, additional functional criteria and insurance authorization.    Follow Up Recommendations  Skilled nursing-short term rehab (<3 hours/day)    Assistance Recommended at Discharge Intermittent Supervision/Assistance  Equipment Recommendations  Other (comment) (TBD at SNF)    Recommendations for Other Services      Precautions / Restrictions Precautions Precautions: Fall Precaution Comments: right AKA Restrictions Weight Bearing Restrictions: Yes RLE Weight Bearing: Non weight bearing       Mobility Bed Mobility Overal bed mobility: Needs Assistance Bed Mobility: Supine to Sit;Sit to Supine     Supine to sit: Min guard;HOB elevated Sit to supine: Min guard   General bed mobility comments: max encourgement to initiate sitting EOB    Transfers                   General transfer comment: pt refused      Balance Overall balance assessment: Needs assistance Sitting-balance support: Feet supported;Bilateral upper extremity supported Sitting balance-Leahy Scale: Good                                     ADL either performed or assessed with clinical judgement   ADL Overall ADL's : Needs assistance/impaired             Lower Body Bathing: Minimal assistance;Sitting/lateral leans Lower Body Bathing Details (indicate cue type and reason): simulated seated EOB     Lower Body Dressing: Sitting/lateral leans;Minimal assistance Lower Body Dressing Details (indicate cue type and reason): simulated seated EOB     Toileting- Clothing Manipulation and Hygiene: Sitting/lateral lean;Minimal assistance Toileting - Clothing Manipulation Details (indicate cue type and reason): simulatd seated EOB       General ADL Comments: Pt irritated stating th at he has not been shown how to use his room phone or call light. OT educated pt on how to call for help and control TV with call bell and how to use the phone in his room    Extremity/Trunk Assessment Upper Extremity Assessment Upper Extremity Assessment: Overall WFL for tasks assessed            Vision Baseline Vision/History: 0 No visual deficits Vision Assessment?: No apparent visual deficits   Perception     Praxis      Cognition Arousal/Alertness: Awake/alert Behavior During Therapy:  (irritated, wants to leave hospital, fixated on food and  not knowing how to use his room phone or call light) Overall Cognitive Status: History of cognitive impairments - at baseline                                            Exercises     Shoulder Instructions       General Comments      Pertinent Vitals/ Pain       Pain Assessment: No/denies pain Pain Score: 0-No pain Pain Intervention(s): Monitored during session  Home Living                                          Prior  Functioning/Environment              Frequency  Min 2X/week        Progress Toward Goals  OT Goals(current goals can now be found in the care plan section)  Progress towards OT goals: OT to reassess next treatment     Plan Discharge plan remains appropriate    Co-evaluation                 AM-PAC OT "6 Clicks" Daily Activity     Outcome Measure   Help from another person eating meals?: None Help from another person taking care of personal grooming?: A Little Help from another person toileting, which includes using toliet, bedpan, or urinal?: A Lot Help from another person bathing (including washing, rinsing, drying)?: A Lot Help from another person to put on and taking off regular upper body clothing?: A Little Help from another person to put on and taking off regular lower body clothing?: A Lot 6 Click Score: 16    End of Session    OT Visit Diagnosis: Unsteadiness on feet (R26.81);Other abnormalities of gait and mobility (R26.89);Muscle weakness (generalized) (M62.81);Pain   Activity Tolerance Patient tolerated treatment well;Treatment limited secondary to agitation   Patient Left in bed;with call bell/phone within reach;with bed alarm set;with nursing/sitter in room   Nurse Communication          Time: 7078-6754 OT Time Calculation (min): 24 min  Charges: OT General Charges $OT Visit: 1 Visit OT Treatments $Self Care/Home Management : 8-22 mins $Therapeutic Activity: 8-22 mins    Britt Bottom 06/01/2021, 2:59 PM

## 2021-06-01 NOTE — Progress Notes (Signed)
RN called and gave report to [facility], they state understanding.  Pt packed up and waiting for PTAR

## 2021-06-01 NOTE — Discharge Summary (Signed)
Physician Discharge Summary  Scott Garrett HER:[id number] DOB: 12/11/1945 DOA: 05/26/2021  PCP: [name]  Admit date: 05/26/2021 Discharge date: 06/01/2021 Admitted From: Home Disposition:  SNF Recommendations for Outpatient Follow-up:  Follow ups as below. Please obtain CBC/BMP/Mag at follow up Please follow up on the following pending results: None  Discharge Condition: Stable CODE STATUS: Full code  Contact information for follow-up providers     Waynetta Sandy, MD. Schedule an appointment as soon as possible for a visit in 1 week(s).   Specialties: Vascular Surgery, Cardiology Contact information: [address] [place] [address] [phone]              Contact information for after-discharge care     Destination     [facility] Preferred SNF .   Service: Skilled Nursing Contact information: [address] [place] [facility] Course: 75 year old with past medical history significant for dementia, diabetes, hypertension, T-cell lymphoproliferative disorder, PVD status post AKA presents with groin wound infection.  Previously admitted 10/17 until 11/3 with right lower extremity ischemia.  He underwent attempted thrombectomy and multiple he required bypass and right AKA.  Right groin surgical wound is open and he was seen in the clinic 11/30 with ultrasound showing residual fluid collection versus organized hematoma, patient was arranged to go to the OR 12/2 for wound exploration.   Patient underwent excision of the right femoral bypass graft and patch angioplasty of the right CFA using right GSV on 05/26/2021.Marland Kitchen  He was a started on IV antibiotics, IV Ancef and IV Flagyl.  He is discharged on p.o. Augmentin for 7 more days.  Vascular surgery recommends dry dressing changes BID or as needed to right groin.  Patient has a known right femoral DVT.  He was not on  anticoagulation as an outpatient.  Case discussed with [name] with vascular and Dr. Learta Codding okay to proceed with  anticoagulation for DVT.  He was a started on IV heparin and discharged on p.o. Eliquis.    Therapy recommended SNF.  See individual problem list below for more on hospital course.  Discharge Diagnoses:  Infected right femoral bypass graft; Groin wound infection: -Concern for right femoral bypass graft infection and so it was partially removed. -Underwent excision of right femoral bypass graft partial.  -IV Ancef and IV Flagyl 12/2-12/6>> p.o. Augmentin for 7 more days -Outpatient follow-up with vascular surgery in 1 week   PVD with right AKA: -S/P recent AKA>  -Per vascular.    T-cell lymphoma: Persistent leukocytosis. follow-up with Dr. Learta Codding, oncology. 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 -Discharged on Eliquis 5 BID    Dementia with behavioral disturbance: -Continue with Aricept, delirium precaution. -Reorientation and delirium precautions.   Uncontrolled DM-2 with hyperglycemia: A1c 11%. Recent Labs  Lab 05/31/21 0803 05/31/21 1206 05/31/21 1600 05/31/21 2205 06/01/21 0734  GLUCAP 97 179* 188* 139* 91  -Continue home Lantus 10 units daily -Resume home metformin   Hypertension: Normotensive. -On lisinopril.   Body mass index is 24.63 kg/m. Nutrition Problem: Increased nutrient needs Etiology: wound healing Signs/Symptoms: estimated needs Interventions: MVI, Premier Protein     Discharge Exam: Vitals:   05/31/21 1556 05/31/21 2200 06/01/21 0511 06/01/21 0740  BP: 124/62 (!) 110/56 133/66 139/73  Pulse: 67 61 65 66  Temp: 98.7 F (37.1 C)  98.3 F (36.8 C) 98.6 F (37 C) 98.5 F (36.9 C)  Resp: 15 20 18 16   Height:      Weight:      SpO2: 99% 98% 96% 99%  TempSrc: Oral Oral Oral Oral  BMI (Calculated):         GENERAL: No apparent distress.  Nontoxic. HEENT: MMM.  Vision and  hearing grossly intact.  NECK: Supple.  No apparent JVD.  RESP: 100% on RA.  No IWOB.  Fair aeration bilaterally. CVS:  RRR. Heart sounds normal.  ABD/GI/GU: Bowel sounds present. Soft. Non tender.  MSK/EXT: Right AKA.  Dressing over right groin DCI. SKIN: no apparent skin lesion or wound NEURO: Awake and alert.  Oriented fairly.  No apparent focal neuro deficit. PSYCH: Calm.  Looks upset about condom cath leaking.  Discharge Instructions  Discharge Instructions     Diet - low sodium heart healthy   Complete by: As directed    Diet Carb Modified   Complete by: As directed    Discharge wound care:   Complete by: As directed    Dry dressing changes BID or as needed to right groin   Increase activity slowly   Complete by: As directed       Allergies as of 06/01/2021   No Known Allergies      Medication List     STOP taking these medications    insulin lispro 100 UNIT/ML KwikPen Commonly known as: HUMALOG       TAKE these medications    acetaminophen 325 MG tablet Commonly known as: TYLENOL Take 2 tablets (650 mg total) by mouth every 6 (six) hours as needed for mild pain (or Fever >/= 101).   amoxicillin-clavulanate 875-125 MG tablet Commonly known as: Augmentin Take 1 tablet by mouth 2 (two) times daily for 7 days.   apixaban 5 MG Tabs tablet Commonly known as: ELIQUIS Take 1 tablet (5 mg total) by mouth 2 (two) times daily.   atorvastatin 20 MG tablet Commonly known as: Lipitor Take 1 tablet (20 mg total) by mouth daily.   Cholecalciferol 25 MCG (1000 UT) Tbdp Take 1,000 Units by mouth in the morning.   donepezil 10 MG tablet Commonly known as: ARICEPT Take 10 mg by mouth at bedtime.   Ensure Max Protein Liqd Take 330 mLs (11 oz total) by mouth daily.   insulin glargine 100 UNIT/ML injection Commonly known as: LANTUS Inject 10 Units into the skin at bedtime.   lisinopril 5 MG tablet Commonly known as: ZESTRIL Take 5 mg by mouth daily.    metFORMIN 500 MG tablet Commonly known as: GLUCOPHAGE Take 500 mg by mouth 2 (two) times daily with a meal.   multivitamin with minerals Tabs tablet Take 1 tablet by mouth daily. Start taking on: June 02, 2021   oxyCODONE 5 MG immediate release tablet Commonly known as: Oxy IR/ROXICODONE Take 1 tablet (5 mg total) by mouth every 6 (six) hours as needed for up to 5 days for moderate pain or severe pain.   PROSTAT PO Take 30 mLs by mouth 3 (three) times daily.   senna-docusate 8.6-50 MG tablet Commonly known as: Senokot-S Take 1 tablet by mouth 2 (two) times daily between meals as needed for mild constipation.               Discharge Care Instructions  (From admission, onward)           Start     Ordered   05/31/21 0000  Discharge wound care:       Comments: Dry dressing changes BID or as needed to right groin   05/31/21 1237            Consultations: Vascular surgery  Procedures/Studies: Excision of the right femoral bypass graft and patch angioplasty of the right CFA using right GSV on 05/26/2021.Marland Kitchen   VAS [place] LOWER EXTREMITY VENOUS (DVT)  Result Date: 05/30/2021  Lower Venous DVT Study Patient Name:  NIJEE HEATWOLE  Date of Exam:   05/30/2021 Medical Rec #: [id number]       Accession #:    [id number] Date of Birth: 06-28-1945       Patient Gender: M Patient Age:   89 years Exam Location:  [facility] Procedure:      VAS [place] LOWER EXTREMITY VENOUS (DVT) Referring Phys: Jerald Kief REGALADO --------------------------------------------------------------------------------  Indications: Edema.  Limitations: Bandages and staples Comparison Study: dvt noted during thrombectomy 10/17 and during arterial duplex                   study 10/30. Performing Technologist: Archie Patten RVS  Examination Guidelines: A complete evaluation includes B-mode imaging, spectral Doppler, color Doppler, and power Doppler as needed of all accessible portions of each vessel. Bilateral  testing is considered an integral part of a complete examination. Limited examinations for reoccurring indications may be performed as noted. The reflux portion of the exam is performed with the patient in reverse Trendelenburg.  +--------+---------------+---------+-----------+----------+--------------------+ RIGHT   CompressibilityPhasicitySpontaneityPropertiesThrombus Aging       +--------+---------------+---------+-----------+----------+--------------------+ CFV                    Yes      Yes                  patent by color                                                           doppler              +--------+---------------+---------+-----------+----------+--------------------+ SFJ                                                  Not well visualized  +--------+---------------+---------+-----------+----------+--------------------+ FV Prox                                              Not well visualized  +--------+---------------+---------+-----------+----------+--------------------+ FV Mid  None           No       No                   Age Indeterminate    +--------+---------------+---------+-----------+----------+--------------------+ FV      None           No       No                   Age Indeterminate    Distal                                                                    +--------+---------------+---------+-----------+----------+--------------------+   +---------+---------------+---------+-----------+----------+--------------+  LEFT     CompressibilityPhasicitySpontaneityPropertiesThrombus Aging +---------+---------------+---------+-----------+----------+--------------+ CFV      Full           Yes      Yes                                 +---------+---------------+---------+-----------+----------+--------------+ SFJ      Full                                                         +---------+---------------+---------+-----------+----------+--------------+ FV Prox  Full                                                        +---------+---------------+---------+-----------+----------+--------------+ FV Mid   Full                                                        +---------+---------------+---------+-----------+----------+--------------+ FV DistalFull                                                        +---------+---------------+---------+-----------+----------+--------------+ PFV      Full                                                        +---------+---------------+---------+-----------+----------+--------------+ POP      Full           Yes      Yes                                 +---------+---------------+---------+-----------+----------+--------------+ PTV      Full                                                        +---------+---------------+---------+-----------+----------+--------------+ PERO     Full                                                        +---------+---------------+---------+-----------+----------+--------------+     Summary: RIGHT: - Findings consistent with age indeterminate deep vein thrombosis involving the right femoral vein. - Portions of this examination were limited- see technologist comments above.  LEFT: - There is no evidence of deep vein thrombosis in the lower extremity.  -  No cystic structure found in the popliteal fossa.  *See table(s) above for measurements and observations. Electronically signed by Deitra Mayo MD on 05/30/2021 at 2:32:38 PM.    Final        The results of significant diagnostics from this hospitalization (including imaging, microbiology, ancillary and laboratory) are listed below for reference.     Microbiology: Recent Results (from the past 240 hour(s))  SARS Coronavirus 2 by RT PCR (hospital order, performed in [facility] hospital lab) Nasopharyngeal  Nasopharyngeal Swab     Status: None   Collection Time: 05/26/21  6:53 AM   Specimen: Nasopharyngeal Swab  Result Value Ref Range Status   SARS Coronavirus 2 NEGATIVE NEGATIVE Final    Comment: (NOTE) SARS-CoV-2 target nucleic acids are NOT DETECTED.  The SARS-CoV-2 RNA is generally detectable in upper and lower respiratory specimens during the acute phase of infection. The lowest concentration of SARS-CoV-2 viral copies this assay can detect is 250 copies / mL. A negative result does not preclude SARS-CoV-2 infection and should not be used as the sole basis for treatment or other patient management decisions.  A negative result may occur with improper specimen collection / handling, submission of specimen other than nasopharyngeal swab, presence of viral mutation(s) within the areas targeted by this assay, and inadequate number of viral copies (<250 copies / mL). A negative result must be combined with clinical observations, patient history, and epidemiological information.  Fact Sheet for Patients:   StrictlyIdeas.no  Fact Sheet for Healthcare Providers: BankingDealers.co.za  This test is not yet approved or  cleared by the [place] FDA and has been authorized for detection and/or diagnosis of SARS-CoV-2 by FDA under an Emergency Use Authorization (EUA).  This EUA will remain in effect (meaning this test can be used) for the duration of the COVID-19 declaration under Section 564(b)(1) of the Act, 21 U.S.C. section 360bbb-3(b)(1), unless the authorization is terminated or revoked sooner.  Performed at [facility], [place] [address], [place], [place] [address]   Surgical pcr screen     Status: None   Collection Time: 05/26/21  8:32 AM   Specimen: Nasal Mucosa; Nasal Swab  Result Value Ref Range Status   MRSA, PCR NEGATIVE NEGATIVE Final   Staphylococcus aureus NEGATIVE NEGATIVE Final    Comment: (NOTE) The Xpert SA  Assay (FDA approved for NASAL specimens in patients 31 years of age and older), is one component of a comprehensive surveillance program. It is not intended to diagnose infection nor to guide or monitor treatment. Performed at [facility], [place] [address], [place], [place] [address]      Labs:  CBC: Recent Labs  Lab 05/26/21 [phone] 05/27/21 0318 05/28/21 0054 05/29/21 0131 05/30/21 0105  WBC 128.3* 151.6* 126.6* 159.6* 168.2*  NEUTROABS  --   --  4.9  --   --   HGB 10.7* 8.6* 8.4* 7.6* 7.7*  HCT 34.2* 27.6* 26.4* 24.5* 25.0*  MCV 96.6 94.5 94.6 95.3 95.1  PLT 87* 87* 84* 81* 86*   BMP &GFR Recent Labs  Lab 05/26/21 0917 05/27/21 0318 05/28/21 0054 05/29/21 0131  NA 141 135 137 137  K 4.6 4.7 4.8 4.9  CL 111 106 110 109  CO2 25 23 22 25   GLUCOSE 113* 234* 160* 129*  BUN 24* 23 19 18   CREATININE 0.81 0.97 0.80 0.85  CALCIUM 8.6* 8.3* 8.0* 8.0*   Estimated Creatinine Clearance: 81.2 mL/min (by C-G formula based on SCr of 0.85  mg/dL). Liver & Pancreas: Recent Labs  Lab 05/26/21 0917  AST 29  ALT 28  ALKPHOS 110  BILITOT 0.7  PROT 5.6*  ALBUMIN 2.9*   No results for input(s): LIPASE, AMYLASE in the last 168 hours. No results for input(s): AMMONIA in the last 168 hours. Diabetic: No results for input(s): HGBA1C in the last 72 hours. Recent Labs  Lab 05/31/21 0803 05/31/21 1206 05/31/21 1600 05/31/21 2205 06/01/21 0734  GLUCAP 97 179* 188* 139* 91   Cardiac Enzymes: No results for input(s): CKTOTAL, CKMB, CKMBINDEX, TROPONINI in the last 168 hours. No results for input(s): PROBNP in the last 8760 hours. Coagulation Profile: Recent Labs  Lab 05/26/21 0758 05/26/21 0918  INR DISREGARD RESULTS.  SPECIMEN IS HEMOLYZED.  NOTIFIED HALEY BOWMAN,RN AT 2575 05/26/2021 BY ZBEECH. 1.1   Thyroid Function Tests: No results for input(s): TSH, T4TOTAL, FREET4, T3FREE, THYROIDAB in the last 72 hours. Lipid Profile: No results for input(s): CHOL, HDL,  LDLCALC, TRIG, CHOLHDL, LDLDIRECT in the last 72 hours. Anemia Panel: No results for input(s): VITAMINB12, FOLATE, FERRITIN, TIBC, IRON, RETICCTPCT in the last 72 hours. Urine analysis:    Component Value Date/Time   COLORURINE YELLOW 04/11/2021 0328   APPEARANCEUR CLOUDY (A) 04/11/2021 0328   LABSPEC 1.012 04/11/2021 0328   PHURINE 5.0 04/11/2021 0328   GLUCOSEU 50 (A) 04/11/2021 0328   HGBUR MODERATE (A) 04/11/2021 0328   BILIRUBINUR NEGATIVE 04/11/2021 0328   KETONESUR 5 (A) 04/11/2021 0328   PROTEINUR 30 (A) 04/11/2021 0328   NITRITE NEGATIVE 04/11/2021 0328   LEUKOCYTESUR LARGE (A) 04/11/2021 0328   Sepsis Labs: Invalid input(s): PROCALCITONIN, LACTICIDVEN   Time coordinating discharge: 55 minutes  SIGNED:  Mercy Riding, MD  [facility] 06/01/2021, 10:06 AM

## 2021-06-01 NOTE — Discharge Instructions (Signed)
Dr. Gearldine Shown office will be calling [facility] for follow up appointment in 1-2 weeks with a CBC. Call office at [phone] if you have not been called in the next 48 hours.

## 2021-06-01 NOTE — TOC Transition Note (Signed)
Transition of Care [facility]) - CM/SW Discharge Note   Patient Details  Name: Scott Garrett MRN: [id number] Date of Birth: December 16, 1945  Transition of Care [facility]) CM/SW Contact:  Emeterio Reeve, LCSW Phone Number: 06/01/2021, 10:07 AM   Clinical Narrative:     Per MD patient ready for DC to [facility]. RN, patient, patient's family, and facility notified of DC. Discharge Summary and FL2 sent to facility. DC packet on chart. Insurance Josem Kaufmann has been received and pt is covid negative. Ambulance transport requested for patient.    RN to call report to [phone]. Room 102.   CSW will sign off for now as social work intervention is no longer needed. Please consult [place] again if new needs arise.   Final next level of care: Skilled Nursing Facility Barriers to Discharge: Barriers Resolved   Patient Goals and CMS Choice Patient states their goals for this hospitalization and ongoing recovery are:: patient unable to participate in goal setting, not fully oriented CMS Medicare.gov Compare Post Acute Care list provided to:: Patient Represenative (must comment) Choice offered to / list presented to : Adult Children  Discharge Placement              Patient chooses bed at: [facility] Patient to be transferred to facility by: [facility]tar Name of family member notified: daughter Patient and family notified of of transfer: 06/01/21  Discharge Plan and Services     Post Acute Care Choice: [facility]                               Social Determinants of Health (SDOH) Interventions     Readmission Risk Interventions No flowsheet data found.  Emeterio Reeve, LCSW Clinical Social Worker

## 2021-06-05 ENCOUNTER — Telehealth: Payer: Self-pay | Admitting: *Deleted

## 2021-06-05 NOTE — Telephone Encounter (Signed)
Staff member at SNF was contacted by telephone to verify understanding of discharge instructions status post their most recent discharge from the hospital on the date:  06/01/2021.   Provided appointment date, time and address w/phone number for Dr. Benay Spice.     Transportation to appointments were confirmed for the patient as being  SNF will transport . Also called daughter and left VM with his appointment information for 12/23 at 10:15

## 2021-06-07 ENCOUNTER — Encounter: Payer: No Typology Code available for payment source | Admitting: Vascular Surgery

## 2021-06-12 ENCOUNTER — Encounter (HOSPITAL_COMMUNITY): Payer: Self-pay | Admitting: Internal Medicine

## 2021-06-12 ENCOUNTER — Inpatient Hospital Stay (HOSPITAL_COMMUNITY): Payer: No Typology Code available for payment source

## 2021-06-12 ENCOUNTER — Other Ambulatory Visit (HOSPITAL_COMMUNITY): Payer: Self-pay

## 2021-06-12 ENCOUNTER — Inpatient Hospital Stay (HOSPITAL_COMMUNITY)
Admission: EM | Admit: 2021-06-12 | Discharge: 2021-06-25 | DRG: 871 | Disposition: E | Payer: No Typology Code available for payment source | Attending: Internal Medicine | Admitting: Internal Medicine

## 2021-06-12 ENCOUNTER — Emergency Department (HOSPITAL_COMMUNITY): Payer: No Typology Code available for payment source

## 2021-06-12 ENCOUNTER — Other Ambulatory Visit: Payer: Self-pay

## 2021-06-12 DIAGNOSIS — G9341 Metabolic encephalopathy: Secondary | ICD-10-CM | POA: Diagnosis present

## 2021-06-12 DIAGNOSIS — D638 Anemia in other chronic diseases classified elsewhere: Secondary | ICD-10-CM | POA: Diagnosis present

## 2021-06-12 DIAGNOSIS — U071 COVID-19: Secondary | ICD-10-CM | POA: Diagnosis present

## 2021-06-12 DIAGNOSIS — E1151 Type 2 diabetes mellitus with diabetic peripheral angiopathy without gangrene: Secondary | ICD-10-CM | POA: Diagnosis present

## 2021-06-12 DIAGNOSIS — D479 Neoplasm of uncertain behavior of lymphoid, hematopoietic and related tissue, unspecified: Secondary | ICD-10-CM | POA: Diagnosis present

## 2021-06-12 DIAGNOSIS — Z794 Long term (current) use of insulin: Secondary | ICD-10-CM

## 2021-06-12 DIAGNOSIS — E875 Hyperkalemia: Secondary | ICD-10-CM | POA: Diagnosis present

## 2021-06-12 DIAGNOSIS — E11319 Type 2 diabetes mellitus with unspecified diabetic retinopathy without macular edema: Secondary | ICD-10-CM | POA: Diagnosis present

## 2021-06-12 DIAGNOSIS — Z87891 Personal history of nicotine dependence: Secondary | ICD-10-CM

## 2021-06-12 DIAGNOSIS — Z89611 Acquired absence of right leg above knee: Secondary | ICD-10-CM

## 2021-06-12 DIAGNOSIS — Z7984 Long term (current) use of oral hypoglycemic drugs: Secondary | ICD-10-CM | POA: Diagnosis not present

## 2021-06-12 DIAGNOSIS — Z7901 Long term (current) use of anticoagulants: Secondary | ICD-10-CM | POA: Diagnosis not present

## 2021-06-12 DIAGNOSIS — J80 Acute respiratory distress syndrome: Secondary | ICD-10-CM | POA: Diagnosis present

## 2021-06-12 DIAGNOSIS — R6521 Severe sepsis with septic shock: Secondary | ICD-10-CM | POA: Diagnosis present

## 2021-06-12 DIAGNOSIS — N179 Acute kidney failure, unspecified: Secondary | ICD-10-CM | POA: Diagnosis not present

## 2021-06-12 DIAGNOSIS — E874 Mixed disorder of acid-base balance: Secondary | ICD-10-CM | POA: Diagnosis present

## 2021-06-12 DIAGNOSIS — I82411 Acute embolism and thrombosis of right femoral vein: Secondary | ICD-10-CM | POA: Diagnosis present

## 2021-06-12 DIAGNOSIS — I468 Cardiac arrest due to other underlying condition: Secondary | ICD-10-CM | POA: Diagnosis not present

## 2021-06-12 DIAGNOSIS — R001 Bradycardia, unspecified: Secondary | ICD-10-CM | POA: Diagnosis present

## 2021-06-12 DIAGNOSIS — J9601 Acute respiratory failure with hypoxia: Secondary | ICD-10-CM | POA: Diagnosis not present

## 2021-06-12 DIAGNOSIS — I469 Cardiac arrest, cause unspecified: Secondary | ICD-10-CM

## 2021-06-12 DIAGNOSIS — A419 Sepsis, unspecified organism: Principal | ICD-10-CM | POA: Diagnosis present

## 2021-06-12 DIAGNOSIS — J189 Pneumonia, unspecified organism: Secondary | ICD-10-CM | POA: Diagnosis present

## 2021-06-12 DIAGNOSIS — Z86718 Personal history of other venous thrombosis and embolism: Secondary | ICD-10-CM

## 2021-06-12 DIAGNOSIS — Z66 Do not resuscitate: Secondary | ICD-10-CM | POA: Diagnosis present

## 2021-06-12 DIAGNOSIS — N170 Acute kidney failure with tubular necrosis: Secondary | ICD-10-CM | POA: Diagnosis present

## 2021-06-12 DIAGNOSIS — F0390 Unspecified dementia without behavioral disturbance: Secondary | ICD-10-CM | POA: Diagnosis present

## 2021-06-12 DIAGNOSIS — I10 Essential (primary) hypertension: Secondary | ICD-10-CM | POA: Diagnosis present

## 2021-06-12 DIAGNOSIS — Z79899 Other long term (current) drug therapy: Secondary | ICD-10-CM

## 2021-06-12 LAB — I-STAT ARTERIAL BLOOD GAS, ED
Acid-base deficit: 17 mmol/L — ABNORMAL HIGH (ref 0.0–2.0)
Acid-base deficit: 21 mmol/L — ABNORMAL HIGH (ref 0.0–2.0)
Acid-base deficit: 19 mmol/L — ABNORMAL HIGH (ref 0.0–2.0)
Bicarbonate: 10.3 mmol/L — ABNORMAL LOW (ref 20.0–28.0)
Bicarbonate: 12 mmol/L — ABNORMAL LOW (ref 20.0–28.0)
Bicarbonate: 13.1 mmol/L — ABNORMAL LOW (ref 20.0–28.0)
Calcium, Ion: 1.15 mmol/L (ref 1.15–1.40)
Calcium, Ion: 1.18 mmol/L (ref 1.15–1.40)
Calcium, Ion: 1.24 mmol/L (ref 1.15–1.40)
HCT: 30 % — ABNORMAL LOW (ref 39.0–52.0)
HCT: 32 % — ABNORMAL LOW (ref 39.0–52.0)
HCT: 34 % — ABNORMAL LOW (ref 39.0–52.0)
Hemoglobin: 10.2 g/dL — ABNORMAL LOW (ref 13.0–17.0)
Hemoglobin: 10.9 g/dL — ABNORMAL LOW (ref 13.0–17.0)
Hemoglobin: 11.6 g/dL — ABNORMAL LOW (ref 13.0–17.0)
O2 Saturation: 79 %
O2 Saturation: 96 %
O2 Saturation: 89 %
Patient temperature: 101
Patient temperature: 101
Patient temperature: 101.4
Potassium: 3.6 mmol/L (ref 3.5–5.1)
Potassium: 5.4 mmol/L — ABNORMAL HIGH (ref 3.5–5.1)
Potassium: 5.2 mmol/L — ABNORMAL HIGH (ref 3.5–5.1)
Sodium: 134 mmol/L — ABNORMAL LOW (ref 135–145)
Sodium: 138 mmol/L (ref 135–145)
Sodium: 138 mmol/L (ref 135–145)
TCO2: 12 mmol/L — ABNORMAL LOW (ref 22–32)
TCO2: 13 mmol/L — ABNORMAL LOW (ref 22–32)
TCO2: 15 mmol/L — ABNORMAL LOW (ref 22–32)
pCO2 arterial: 40.6 mmHg (ref 32.0–48.0)
pCO2 arterial: 49.8 mmHg — ABNORMAL HIGH (ref 32.0–48.0)
pCO2 arterial: 66.6 mmHg (ref 32.0–48.0)
pH, Arterial: 6.934 — CL (ref 7.350–7.450)
pH, Arterial: 7.088 — CL (ref 7.350–7.450)
pH, Arterial: 6.912 — CL (ref 7.350–7.450)
pO2, Arterial: 115 mmHg — ABNORMAL HIGH (ref 83.0–108.0)
pO2, Arterial: 75 mmHg — ABNORMAL LOW (ref 83.0–108.0)
pO2, Arterial: 101 mmHg (ref 83.0–108.0)

## 2021-06-12 LAB — CBC WITH DIFFERENTIAL/PLATELET
Abs Immature Granulocytes: 0 10*3/uL (ref 0.00–0.07)
Basophils Absolute: 0 10*3/uL (ref 0.0–0.1)
Basophils Relative: 0 %
Eosinophils Absolute: 0 10*3/uL (ref 0.0–0.5)
Eosinophils Relative: 0 %
HCT: 33.1 % — ABNORMAL LOW (ref 39.0–52.0)
Hemoglobin: 9.2 g/dL — ABNORMAL LOW (ref 13.0–17.0)
Lymphocytes Relative: 86 %
Lymphs Abs: 266.6 10*3/uL — ABNORMAL HIGH (ref 0.7–4.0)
MCH: 28.8 pg (ref 26.0–34.0)
MCHC: 27.8 g/dL — ABNORMAL LOW (ref 30.0–36.0)
MCV: 103.8 fL — ABNORMAL HIGH (ref 80.0–100.0)
Monocytes Absolute: 24.8 10*3/uL — ABNORMAL HIGH (ref 0.1–1.0)
Monocytes Relative: 8 %
Neutro Abs: 18.6 10*3/uL — ABNORMAL HIGH (ref 1.7–7.7)
Neutrophils Relative %: 6 %
Platelets: 80 10*3/uL — ABNORMAL LOW (ref 150–400)
RBC: 3.19 MIL/uL — ABNORMAL LOW (ref 4.22–5.81)
RDW: 15.9 % — ABNORMAL HIGH (ref 11.5–15.5)
Smear Review: DECREASED
WBC: 310 10*3/uL (ref 4.0–10.5)
nRBC: 0 % (ref 0.0–0.2)

## 2021-06-12 LAB — RESP PANEL BY RT-PCR (FLU A&B, COVID) ARPGX2
Influenza A by PCR: NEGATIVE
Influenza B by PCR: NEGATIVE
SARS Coronavirus 2 by RT PCR: POSITIVE — AB

## 2021-06-12 LAB — BASIC METABOLIC PANEL
Anion gap: 16 — ABNORMAL HIGH (ref 5–15)
BUN: 22 mg/dL (ref 8–23)
CO2: 15 mmol/L — ABNORMAL LOW (ref 22–32)
Calcium: 8 mg/dL — ABNORMAL LOW (ref 8.9–10.3)
Chloride: 101 mmol/L (ref 98–111)
Creatinine, Ser: 2.03 mg/dL — ABNORMAL HIGH (ref 0.61–1.24)
GFR, Estimated: 34 mL/min — ABNORMAL LOW (ref >60)
Glucose, Bld: 222 mg/dL — ABNORMAL HIGH (ref 70–99)
Potassium: 7.4 mmol/L (ref 3.5–5.1)
Sodium: 132 mmol/L — ABNORMAL LOW (ref 135–145)

## 2021-06-12 LAB — I-STAT CHEM 8, ED
BUN: 31 mg/dL — ABNORMAL HIGH (ref 8–23)
Calcium, Ion: 1.07 mmol/L — ABNORMAL LOW (ref 1.15–1.40)
Chloride: 104 mmol/L (ref 98–111)
Creatinine, Ser: 2.1 mg/dL — ABNORMAL HIGH (ref 0.61–1.24)
Glucose, Bld: 208 mg/dL — ABNORMAL HIGH (ref 70–99)
HCT: 38 % — ABNORMAL LOW (ref 39.0–52.0)
Hemoglobin: 12.9 g/dL — ABNORMAL LOW (ref 13.0–17.0)
Potassium: 6.9 mmol/L (ref 3.5–5.1)
Sodium: 131 mmol/L — ABNORMAL LOW (ref 135–145)
TCO2: 19 mmol/L — ABNORMAL LOW (ref 22–32)

## 2021-06-12 LAB — URINALYSIS, ROUTINE W REFLEX MICROSCOPIC
Bilirubin Urine: NEGATIVE
Glucose, UA: NEGATIVE mg/dL
Ketones, ur: NEGATIVE mg/dL
Leukocytes,Ua: NEGATIVE
Nitrite: NEGATIVE
Protein, ur: 100 mg/dL — AB
Specific Gravity, Urine: >1.03 — ABNORMAL HIGH (ref 1.005–1.030)
pH: 5.5 (ref 5.0–8.0)

## 2021-06-12 LAB — URINALYSIS, MICROSCOPIC (REFLEX)

## 2021-06-12 LAB — LACTIC ACID, PLASMA: Lactic Acid, Venous: >9 mmol/L (ref 0.5–1.9)

## 2021-06-12 LAB — PROTIME-INR
INR: 1.8 — ABNORMAL HIGH (ref 0.8–1.2)
Prothrombin Time: 21.1 seconds — ABNORMAL HIGH (ref 11.4–15.2)

## 2021-06-12 LAB — TROPONIN I (HIGH SENSITIVITY)
Troponin I (High Sensitivity): 176 ng/L (ref <18)
Troponin I (High Sensitivity): 493 ng/L (ref <18)

## 2021-06-12 LAB — MAGNESIUM: Magnesium: 1.8 mg/dL (ref 1.7–2.4)

## 2021-06-12 LAB — BRAIN NATRIURETIC PEPTIDE: B Natriuretic Peptide: 394 pg/mL — ABNORMAL HIGH (ref 0.0–100.0)

## 2021-06-12 MED ORDER — ACETAMINOPHEN 650 MG RE SUPP: 650.0000 mg | RECTAL | Status: DC

## 2021-06-12 MED ORDER — INSULIN ASPART 100 UNIT/ML IJ SOLN
10.0000 [IU] | Freq: Once | INTRAMUSCULAR | Status: AC
  Administered 2021-06-12: 11:00:00 10 [IU] via INTRAVENOUS

## 2021-06-12 MED ORDER — ALBUTEROL SULFATE (2.5 MG/3ML) 0.083% IN NEBU
10.0000 mg | INHALATION_SOLUTION | Freq: Once | RESPIRATORY_TRACT | Status: AC
  Administered 2021-06-12: 10:00:00 10 mg via RESPIRATORY_TRACT
  Filled 2021-06-12: qty 12

## 2021-06-12 MED ORDER — BUSPIRONE HCL 15 MG PO TABS: 30.0000 mg | ORAL_TABLET | Freq: Three times a day (TID) | ORAL | Status: DC

## 2021-06-12 MED ORDER — SODIUM BICARBONATE 8.4 % IV SOLN
50.0000 meq | Freq: Once | INTRAVENOUS | Status: AC
  Administered 2021-06-12: 11:00:00 50 meq via INTRAVENOUS

## 2021-06-12 MED ORDER — PROPOFOL 1000 MG/100ML IV EMUL
5.0000 ug/kg/min | INTRAVENOUS | Status: DC
  Filled 2021-06-12: qty 100

## 2021-06-12 MED ORDER — CALCIUM GLUCONATE 10 % IV SOLN
1.0000 g | Freq: Once | INTRAVENOUS | Status: AC
  Administered 2021-06-12: 11:00:00 1 g via INTRAVENOUS
  Filled 2021-06-12: qty 10

## 2021-06-12 MED ORDER — EPINEPHRINE 1 MG/10ML IJ SOSY
PREFILLED_SYRINGE | INTRAMUSCULAR | Status: AC | PRN
  Administered 2021-06-12 (×2): 1 mg via INTRAVENOUS

## 2021-06-12 MED ORDER — DEXTROSE 50 % IV SOLN
1.0000 | Freq: Once | INTRAVENOUS | Status: AC
  Administered 2021-06-12: 11:00:00 50 mL via INTRAVENOUS
  Filled 2021-06-12: qty 50

## 2021-06-12 MED ORDER — ENOXAPARIN SODIUM 40 MG/0.4ML IJ SOSY
40.0000 mg | PREFILLED_SYRINGE | INTRAMUSCULAR | Status: DC
  Administered 2021-06-12: 13:00:00 40 mg via SUBCUTANEOUS
  Filled 2021-06-12: qty 0.4

## 2021-06-12 MED ORDER — VASOPRESSIN 20 UNITS/100 ML INFUSION FOR SHOCK
0.0000 [IU]/min | INTRAVENOUS | Status: DC
  Filled 2021-06-12: qty 100

## 2021-06-12 MED ORDER — SODIUM BICARBONATE 8.4 % IV SOLN
50.0000 meq | Freq: Once | INTRAVENOUS | Status: AC
  Administered 2021-06-12: 11:00:00 50 meq via INTRAVENOUS
  Filled 2021-06-12: qty 50

## 2021-06-12 MED ORDER — NOREPINEPHRINE 4 MG/250ML-% IV SOLN
0.0000 ug/min | INTRAVENOUS | Status: DC
  Administered 2021-06-12: 10:00:00 10 ug/min via INTRAVENOUS
  Administered 2021-06-12: 13:00:00 40 ug/min via INTRAVENOUS
  Filled 2021-06-12: qty 250

## 2021-06-12 MED ORDER — INSULIN ASPART 100 UNIT/ML IV SOLN
10.0000 [IU] | Freq: Once | INTRAVENOUS | Status: AC
  Administered 2021-06-12: 11:00:00 10 [IU] via INTRAVENOUS

## 2021-06-12 MED ORDER — VANCOMYCIN HCL 1750 MG/350ML IV SOLN
1750.0000 mg | Freq: Once | INTRAVENOUS | Status: DC
  Filled 2021-06-12: qty 350

## 2021-06-12 MED ORDER — SODIUM CHLORIDE 0.9 % IV BOLUS
1000.0000 mL | Freq: Once | INTRAVENOUS | Status: AC
  Administered 2021-06-12: 11:00:00 1000 mL via INTRAVENOUS

## 2021-06-12 MED ORDER — HYDROCORTISONE SOD SUC (PF) 100 MG IJ SOLR
100.0000 mg | Freq: Two times a day (BID) | INTRAMUSCULAR | Status: DC
  Administered 2021-06-12: 13:00:00 100 mg via INTRAVENOUS
  Filled 2021-06-12: qty 2

## 2021-06-12 MED ORDER — ONDANSETRON HCL 4 MG/2ML IJ SOLN: 4.0000 mg | Freq: Four times a day (QID) | INTRAMUSCULAR | Status: DC | PRN

## 2021-06-12 MED ORDER — VANCOMYCIN VARIABLE DOSE PER UNSTABLE RENAL FUNCTION (PHARMACIST DOSING): Status: DC

## 2021-06-12 MED ORDER — ACETAMINOPHEN 160 MG/5ML PO SOLN: 650.0000 mg | ORAL | Status: DC

## 2021-06-12 MED ORDER — SODIUM CHLORIDE 0.9 % IV SOLN
250.0000 mL | INTRAVENOUS | Status: DC
  Administered 2021-06-12: 13:00:00 250 mL via INTRAVENOUS

## 2021-06-12 MED ORDER — PANTOPRAZOLE SODIUM 40 MG IV SOLR: 40.0000 mg | Freq: Every day | INTRAVENOUS | Status: DC

## 2021-06-12 MED ORDER — SODIUM CHLORIDE 0.9 % IV SOLN
500.0000 mg | Freq: Once | INTRAVENOUS | Status: AC
  Administered 2021-06-12: 12:00:00 500 mg via INTRAVENOUS
  Filled 2021-06-12: qty 5

## 2021-06-12 MED ORDER — CALCIUM CHLORIDE 10 % IV SOLN
INTRAVENOUS | Status: AC | PRN
  Administered 2021-06-12: 1 g via INTRAVENOUS

## 2021-06-12 MED ORDER — POLYETHYLENE GLYCOL 3350 17 G PO PACK: 17.0000 g | PACK | Freq: Every day | ORAL | Status: DC

## 2021-06-12 MED ORDER — PIPERACILLIN-TAZOBACTAM 3.375 G IVPB: 3.3750 g | Freq: Three times a day (TID) | INTRAVENOUS | Status: DC

## 2021-06-12 MED ORDER — ACETAMINOPHEN 325 MG PO TABS: 650.0000 mg | ORAL_TABLET | ORAL | Status: DC

## 2021-06-12 MED ORDER — SODIUM BICARBONATE 8.4 % IV SOLN
INTRAVENOUS | Status: AC | PRN
  Administered 2021-06-12 (×2): 50 meq via INTRAVENOUS

## 2021-06-12 MED ORDER — EPINEPHRINE HCL 5 MG/250ML IV SOLN IN NS
0.5000 ug/min | INTRAVENOUS | Status: DC
  Administered 2021-06-12: 10 ug/min via INTRAVENOUS
  Filled 2021-06-12: qty 250

## 2021-06-12 MED ORDER — SODIUM BICARBONATE 8.4 % IV SOLN
INTRAVENOUS | Status: DC
  Filled 2021-06-12 (×2): qty 1000

## 2021-06-12 MED ORDER — MAGNESIUM SULFATE 2 GM/50ML IV SOLN
2.0000 g | Freq: Once | INTRAVENOUS | Status: DC
  Filled 2021-06-12: qty 50

## 2021-06-12 MED ORDER — SODIUM CHLORIDE 0.9 % IV SOLN
1.0000 g | Freq: Once | INTRAVENOUS | Status: DC
  Filled 2021-06-12: qty 10

## 2021-06-12 MED ORDER — DOCUSATE SODIUM 50 MG/5ML PO LIQD: 100.0000 mg | Freq: Two times a day (BID) | ORAL | Status: DC

## 2021-06-12 MED ORDER — PIPERACILLIN-TAZOBACTAM 3.375 G IVPB
3.3750 g | Freq: Once | INTRAVENOUS | Status: AC
  Administered 2021-06-12: 11:00:00 3.375 g via INTRAVENOUS

## 2021-06-13 LAB — URINE CULTURE: Culture: NO GROWTH

## 2021-06-14 ENCOUNTER — Encounter: Payer: No Typology Code available for payment source | Admitting: Vascular Surgery

## 2021-06-15 MED FILL — Medication: Qty: 1 | Status: AC

## 2021-06-16 ENCOUNTER — Inpatient Hospital Stay: Payer: Medicare (Managed Care) | Admitting: Oncology

## 2021-06-16 ENCOUNTER — Inpatient Hospital Stay: Payer: Medicare (Managed Care)

## 2021-06-17 LAB — CULTURE, BLOOD (ROUTINE X 2)
Culture: NO GROWTH
Special Requests: ADEQUATE

## 2021-06-25 NOTE — ED Triage Notes (Addendum)
Patient  presents to ed via [facility] from [facility]. Per staff patient was at his normal baseline 30 mins later ems was called for resp distress upon ems arrival patient was able to talk to them in 1-2 word sentences, patient them became unresponsive and was being bagged upon arrival heart went from 116-46 patient was placed on pacer, rate of 65 ma 120 .  0930 no pulse cpr started  Patient was given Tylenol 1000mg  and albuterol 5 mg per ems

## 2021-06-25 NOTE — ED Notes (Signed)
Pt remains unresponsive without sedation.

## 2021-06-25 NOTE — ED Notes (Signed)
ICU provider at bedside & aware of all critical labs.

## 2021-06-25 NOTE — Progress Notes (Signed)
Pharmacy Antibiotic Note  Scott Garrett is a 76 y.o. male admitted on 08-Jul-2021 with concern for sepsis secondary to pneumonia. Pharmacy has been consulted for Vancomycin and Zosyn dosing.  Plan: Vancomycin 1750 mg IV x 1, then variable dosing due to unstable renal function.  Zosyn 3.375g IV q8h (4 hour infusion).  Monitor cultures, clinical course, renal function. Deescalate when able. Levels at steady state.   Height: 5\' 9"  (175.3 cm) (per daughter) Weight: 82 kg (180 lb 12.4 oz) IBW/kg (Calculated) : 70.7  No data recorded.  Recent Labs  Lab 07-08-2021 0950 07/08/2021 0954  WBC 310.0*  --   CREATININE 2.03* 2.10*  LATICACIDVEN >9.0*  --     Estimated Creatinine Clearance: 30.9 mL/min (A) (by C-G formula based on SCr of 2.1 mg/dL (H)).    No Known Allergies  Antimicrobials this admission: Vancomycin 12/19 >> Zosyn 12/19 >>  Microbiology results: 12/19 BCx: Collected 12/19 UCx: Needs to be collected   Thank you for allowing pharmacy to be a part of this patients care.  Debbora Presto PharmD Candidate 07/08/2021 12:15 PM

## 2021-06-25 NOTE — ED Notes (Signed)
EDP Dykstra at bedside speaking with pt's daughter & sister.

## 2021-06-25 NOTE — ED Notes (Signed)
Spoke with daughter [phone] update given.

## 2021-06-25 NOTE — ED Notes (Signed)
CPR for 2 minutes and EPI administered with CPR. Pulses back and CPR discontrinued

## 2021-06-25 NOTE — Progress Notes (Signed)
An USGPIV (ultrasound guided PIV) has been placed for short-term vasopressor infusion. A correctly placed ivWatch must be used when administering Vasopressors. Should this treatment be needed beyond 72 hours, central line access should be obtained.  It will be the responsibility of the bedside nurse to follow best practice to prevent extravasations.   ?

## 2021-06-25 NOTE — Progress Notes (Signed)
RT NOTES: Transported patient from ED 15 to CT and then to room 3M04 on vent without incident.

## 2021-06-25 NOTE — ED Notes (Signed)
Dr. Tacy Learn aware of elevated WBC

## 2021-06-25 NOTE — Progress Notes (Signed)
Clinically Undetermined

## 2021-06-25 NOTE — Procedures (Signed)
Arterial Catheter Insertion Procedure Note  Maor Meckel  [id number]  04-20-1946  Date:06-14-2021  Time:11:46 AM    Provider Performing: Jacky Kindle    Procedure: Insertion of Arterial Line [phone]) with US guidance ([id number])   Indication(s) Blood pressure monitoring and/or need for frequent ABGs  Consent Unable to obtain consent due to emergent nature of procedure.  Anesthesia None   Time Out Verified patient identification, verified procedure, site/side was marked, verified correct patient position, special equipment/implants available, medications/allergies/relevant history reviewed, required imaging and test results available.   Sterile Technique Maximal sterile technique including full sterile barrier drape, hand hygiene, sterile gown, sterile gloves, mask, hair covering, sterile ultrasound probe cover (if used).   Procedure Description Area of catheter insertion was cleaned with chlorhexidine and draped in sterile fashion. With real-time ultrasound guidance an arterial catheter was placed into the left  axillary  artery.  Appropriate arterial tracings confirmed on monitor.     Complications/Tolerance None; patient tolerated the procedure well.   EBL Minimal   Specimen(s) None

## 2021-06-25 NOTE — Progress Notes (Signed)
Patient arrived to 3MW with pulse, quickly lost pulse and went into PEA, CPR initiated at 1338, Dr. Tacy Learn called, code blue called, ventilation assisted via bag mask, Epi x2 and Bicarb x2 given with pulse obtained at 1343. Patient code status changed to DNR per Dr. Tacy Learn at bedside.  Minutes later patient lost pulse again and heart rhythm went PEA, family arrived and at bedside. Cardiac time of death is 1358. MD Dr. Tacy Learn notified.  Dewaine Oats

## 2021-06-25 NOTE — ED Notes (Signed)
Family stated at bedside during discussion with EDP Dykstra that they do want pt to remain full code at this time per pt wishes.

## 2021-06-25 NOTE — Procedures (Signed)
Cardiopulmonary Resuscitation Note  Scott Garrett  [id number]  01-19-1946  Date:Jun 25, 2021  Time:11:47 AM   Provider Performing:Reyan Helle   Procedure: Cardiopulmonary Resuscitation [phone])  Indication(s) Loss of Pulse  Consent N/A  Anesthesia N/A   Time Out N/A   Sterile Technique Hand hygiene, gloves   Procedure Description Called to patient's room for CODE BLUE. Initial rhythm was PEA/Asystole. Patient received high quality chest compressions for 2 minutes with defibrillation or cardioversion when appropriate. Epinephrine was administered every 3 minutes as directed by time Therapist, nutritional. Additional pharmacologic interventions included calcium chloride, sodium bicarbonate, and D50. Additional procedural interventions include arterial line and intra-osseus line.  Return of spontaneous circulation was achieved.  Family called and notified.   Complications/Tolerance N/A   EBL N/A   Specimen(s) N/A  Estimated time to ROSC: 2 minutes

## 2021-06-25 NOTE — H&P (Signed)
NAME:  Scott Garrett, MRN:  [id number], DOB:  Nov 10, 1945, LOS: 0 ADMISSION DATE:  2021-06-22, CONSULTATION DATE:  06-22-21 REFERRING MD:  Lucrezia Starch, MD, CHIEF COMPLAINT:  Unresponsive, hypoxic and hypotensive  History of Present Illness:  76 year old male with history of diabetes, hypertension, T-cell lymphoproliferative disorder, peripheral arterial disease s/p right AKA who presented from nursing home with shortness of breath, hypoxia and altered mental status.  In route to hospital patient went into PEA cardiac arrest, requiring chest compression and epinephrine, ROSC was achieved after 3 minutes.  In the emergency department patient was noted to be hypotensive severely acidotic with elevated lactate level, he was started on IV norepinephrine and epinephrine.  PCCM was consulted for help with evaluation management, while evaluating the patient in the emergency department he went into PEA cardiac arrest and ROSC was achieved after 2 minutes of CPR and 1 of epi.    Pertinent  Medical History   Past Medical History:  Diagnosis Date   Colon polyps    Dementia ([place])    Diabetic retinopathy ([place])    DM2 (diabetes mellitus, type 2) ([place])    HTN (hypertension)    Leucocytosis      Significant Hospital Events: Including procedures, antibiotic start and stop dates in addition to other pertinent events     Interim History / Subjective:    Objective   Blood pressure (!) 192/85, pulse (!) 124, resp. rate (!) 34, weight 82 kg, SpO2 96 %.    Vent Mode: PRVC FiO2 (%):  [100 %] 100 % Set Rate:  [20 bmp-34 bmp] 34 bmp Vt Set:  [600 mL] 600 mL PEEP:  [5 cmH20-14 cmH20] 14 cmH20 Plateau Pressure:  [18 cmH20-35 cmH20] 35 cmH20  No intake or output data in the 24 hours ending Jun 22, 2021 1148 Filed Weights   2021/06/22 1000  Weight: 82 kg    Examination:   Physical exam: General: Crtitically ill-appearing male, orally intubated HEENT: [place]/AT, eyes anicteric.  ETT and OGT in  place Neuro: Eyes closed, not following commands, pupils 3 mm bilateral nonreactive to light, absent gag and cough. Chest: Bilateral crackles at bases right more than left, no wheezes or rhonchi Heart: Regular rate and rhythm, no murmurs or gallops Abdomen: Soft, nontender, nondistended, bowel sounds present Skin: No rash  Resolved Hospital Problem list     Assessment & Plan:  S/p PEA cardiac arrest likely in the setting of acidosis and hyperkalemia Septic shock due to bilateral multifocal pneumonia Acute hypoxic respiratory failure due to severe ARDS Septic encephalopathy Combined metabolic and respiratory acidosis Severe hyperkalemia Acute kidney injury due to septic ATN Anemia of chronic disease  Patient went into cardiac arrest twice, ROSC was achieved after 3 minutes first time and then 2 minutes second time Likely cause of cardiac arrest Will start normothermia protocol Hyperkalemia was corrected with D50/insulin, sodium bicarbonate and patient was given 2 A of calcium chloride Continue broad-spectrum antibiotics with IV vancomycin and Zosyn X-ray chest is suggestive of bilateral multifocal pneumonia right more than left Follow-up cultures and adjust antibiotics accordingly Vent setting was adjusted to help clear hypercapnia Patient has severe acidosis with initial pH 6.9, now its improving last 1 was 7.0 He is on bicarbonate infusion Will try to see if patient can be proned once he comes to the ICU Closely monitor electrolytes Trend ABGs and lactate Follow-up procalcitonin Patient baseline serum creatinine is 0.8, now is elevated to 2.1 Likely due to septic ATN Continue aggressive IV fluid resuscitation, closely  monitor electrolytes and serum creatinine Monitor H&H Best Practice (right click and "Reselect all SmartList Selections" daily)   Diet/type: NPO DVT prophylaxis: LMWH GI prophylaxis: PPI Lines: Central line and Arterial Line Foley:  Yes, and it is still  needed Code Status:  full code Last date of multidisciplinary goals of care discussion [pending]  Labs   CBC: Recent Labs  Lab 2021/06/15 0950 2021/06/15 0954 June 15, 2021 1036 2021/06/15 1114  WBC 310.0*  --   --   --   NEUTROABS 18.6*  --   --   --   HGB 9.2* 12.9* 10.9* 10.2*  HCT 33.1* 38.0* 32.0* 30.0*  MCV 103.8*  --   --   --   PLT 80*  --   --   --     Basic Metabolic Panel: Recent Labs  Lab 06-15-21 0950 06-15-21 0954 06/15/21 1036 06-15-21 1114  NA 132* 131* 134* 138  K 7.4* 6.9* 5.4* 3.6  CL 101 104  --   --   CO2 15*  --   --   --   GLUCOSE 222* 208*  --   --   BUN 22 31*  --   --   CREATININE 2.03* 2.10*  --   --   CALCIUM 8.0*  --   --   --   MG 1.8  --   --   --    GFR: Estimated Creatinine Clearance: 32.9 mL/min (A) (by C-G formula based on SCr of 2.1 mg/dL (H)). Recent Labs  Lab 06/15/21 0950  WBC 310.0*  LATICACIDVEN >9.0*    Liver Function Tests: No results for input(s): AST, ALT, ALKPHOS, BILITOT, PROT, ALBUMIN in the last 168 hours. No results for input(s): LIPASE, AMYLASE in the last 168 hours. No results for input(s): AMMONIA in the last 168 hours.  ABG    Component Value Date/Time   PHART 7.088 (LL) 06/15/2021 1114   PCO2ART 40.6 06-15-21 1114   PO2ART 115 (H) 15-Jun-2021 1114   HCO3 12.0 (L) 2021/06/15 1114   TCO2 13 (L) 06-15-21 1114   ACIDBASEDEF 17.0 (H) 06/15/21 1114   O2SAT 96.0 06/15/2021 1114     Coagulation Profile: Recent Labs  Lab 15-Jun-2021 0950  INR 1.8*    Cardiac Enzymes: No results for input(s): CKTOTAL, CKMB, CKMBINDEX, TROPONINI in the last 168 hours.  HbA1C: Hgb A1c MFr Bld  Date/Time Value Ref Range Status  04/10/2021 11:07 PM 11.5 (H) 4.8 - 5.6 % Final    Comment:    (NOTE) Pre diabetes:          5.7%-6.4%  Diabetes:              >6.4%  Glycemic control for   <7.0% adults with diabetes     CBG: No results for input(s): GLUCAP in the last 168 hours.  Review of Systems:   Unable to obtain  as patient is intubated  Past Medical History:  He,  has a past medical history of Colon polyps, Dementia ([place]), Diabetic retinopathy ([place]), DM2 (diabetes mellitus, type 2) ([place]), HTN (hypertension), and Leucocytosis.   Surgical History:   Past Surgical History:  Procedure Laterality Date   AMPUTATION Right 04/14/2021   Procedure: RIGHT ABOVE KNEE AMPUTATION;  Surgeon: Angelia Mould, MD;  Location: [place];  Service: Vascular;  Laterality: Right;   COLONOSCOPY     FASCIOTOMY Right 04/10/2021   Procedure: FASCIOTOMY RIGHT LOWER EXTREMITY;  Surgeon: Waynetta Sandy, MD;  Location: [place];  Service: Vascular;  Laterality: Right;   FEMORAL-POPLITEAL BYPASS GRAFT Right 04/10/2021   Procedure: BYPASS GRAFT RIGHT FEMORAL-POPLITEAL ARTERY USING PROPATEN GRAFT;  Surgeon: Waynetta Sandy, MD;  Location: [place];  Service: Vascular;  Laterality: Right;   HERNIA REPAIR     groin, unsure of side   PATCH ANGIOPLASTY Right 05/26/2021   Procedure: PATCH ANGIOPLASTY OF RIGHT FEMORAL ARTERY USING RIGHT GREATER SAPHENOUS VEIN PATCH;  Surgeon: Waynetta Sandy, MD;  Location: [place];  Service: Vascular;  Laterality: Right;   REMOVAL OF GRAFT Right 05/26/2021   Procedure: RIGHT GROIN EXPOLRATION AND EXPLANTATION OF RIGHT FEMORAL GRAFT ; REMOVAL OF RIGHT STUMP STAPLES;  Surgeon: Waynetta Sandy, MD;  Location: [place];  Service: Vascular;  Laterality: Right;   THROMBECTOMY FEMORAL ARTERY Right 04/10/2021   Procedure: THROMBECTOMY RIGHT FEMORAL ARTERY, THROMBECTOMY RIGHT LOWER EXTREMITY;  Surgeon: Waynetta Sandy, MD;  Location: [place];  Service: Vascular;  Laterality: Right;     Social History:   reports that he quit smoking about 7 weeks ago. His smoking use included cigarettes. He has never used smokeless tobacco. He reports that he does not currently use alcohol. He reports that he does not currently use drugs.   Family History:  His Family history is unknown  by patient.   Allergies No Known Allergies   Home Medications  Prior to Admission medications   Medication Sig Start Date End Date Taking? Authorizing Provider  acetaminophen (TYLENOL) 325 MG tablet Take 2 tablets (650 mg total) by mouth every 6 (six) hours as needed for mild pain (or Fever >/= 101). 05/31/21   Mercy Riding, MD  apixaban (ELIQUIS) 5 MG TABS tablet Take 1 tablet (5 mg total) by mouth 2 (two) times daily. 06/01/21   Mercy Riding, MD  atorvastatin (LIPITOR) 20 MG tablet Take 1 tablet (20 mg total) by mouth daily. 05/31/21 05/31/22  Mercy Riding, MD  Cholecalciferol 25 MCG (1000 UT) TBDP Take 1,000 Units by mouth in the morning.    [provider]  donepezil (ARICEPT) 10 MG tablet Take 10 mg by mouth at bedtime.    [provider]  Ensure Max Protein (ENSURE MAX PROTEIN) LIQD Take 330 mLs (11 oz total) by mouth daily. 05/31/21   Mercy Riding, MD  insulin glargine (LANTUS) 100 UNIT/ML injection Inject 10 Units into the skin at bedtime.    [provider]  lisinopril (ZESTRIL) 5 MG tablet Take 5 mg by mouth daily.    [provider]  metFORMIN (GLUCOPHAGE) 500 MG tablet Take 500 mg by mouth 2 (two) times daily with a meal.    [provider]  Multiple Vitamin (MULTIVITAMIN WITH MINERALS) TABS tablet Take 1 tablet by mouth daily. 06/02/21   Mercy Riding, MD  Pollen Extracts (PROSTAT PO) Take 30 mLs by mouth 3 (three) times daily.    [provider]  senna-docusate (SENOKOT-S) 8.6-50 MG tablet Take 1 tablet by mouth 2 (two) times daily between meals as needed for mild constipation. 05/31/21   Mercy Riding, MD     Critical care time:     Total critical care time: 83 minutes  Performed by: [place] care time was exclusive of separately billable procedures and treating other patients.   Critical care was necessary to treat or prevent imminent or life-threatening deterioration.   Critical care was time spent  personally by me on the following activities: development of treatment plan with patient and/or surrogate as well  as nursing, discussions with consultants, evaluation of patient's response to treatment, examination of patient, obtaining history from patient or surrogate, ordering and performing treatments and interventions, ordering and review of laboratory studies, ordering and review of radiographic studies, pulse oximetry and re-evaluation of patient's condition.   Jacky Kindle MD [facility] See Amion for pager If no response to pager, please call [phone] until 7pm After 7pm, Please call E-link [phone]

## 2021-06-25 NOTE — Procedures (Signed)
Intraosseous Needle Insertion Procedure Note    Date:07-Jul-2021  Time:11:45 AM   Provider Performing:Maridee Slape   Procedure: Insertion Intraosseous (70340)  Indication(s) Medication administration  Consent Unable to obtain consent due to emergent nature of procedure.   Timeout Verified patient identification, verified procedure, site/side was marked, verified correct patient position, special equipment/implants available, medications/allergies/relevant history reviewed, required imaging and test results available.  Procedure Description Area of needle insertion was cleaned with chlorhexidine. Intraosseous needle was placed into the left tibia. Bone marrow was aspirated and site easily flushed. The needle was secured in place and dressing applied.  Complications/Tolerance None; patient tolerated the procedure well.  EBL Minimal

## 2021-06-25 NOTE — Death Summary Note (Signed)
DEATH SUMMARY   Patient Details  Name: Scott Garrett MRN: [id number] DOB: Nov 21, 1945  Admission/Discharge Information   Admit Date:  2021/06/23  Date of Death: Date of Death: 06-23-21  Time of Death: Time of Death: 1356/09/18  Length of Stay: 0  Referring Physician: [name]   Reason(s) for Hospitalization  S/p PEA cardiac arrest likely in the setting of acidosis and hyperkalemia Septic shock due to bilateral multifocal pneumonia Acute hypoxic respiratory failure due to severe ARDS Septic encephalopathy Combined metabolic and respiratory acidosis Severe hyperkalemia Acute kidney injury due to septic ATN Anemia of chronic disease  Diagnoses  Preliminary cause of death: Septic Shock Secondary Diagnoses (including complications and co-morbidities):  Principal Problem:   Septic shock [facility])   Brief Hospital Course (including significant findings, care, treatment, and services provided and events leading to death)  Artis Beggs is a 76 y.o. year old male with history of diabetes, hypertension, T-cell lymphoproliferative disorder, peripheral arterial disease s/p right AKA who presented from nursing home with shortness of breath, hypoxia and altered mental status.  In route to hospital patient went into PEA cardiac arrest, requiring chest compression and epinephrine, ROSC was achieved after 3 minutes.  In the emergency department patient was noted to be hypotensive severely acidotic with elevated lactate level, he was started on IV norepinephrine and epinephrine.  PCCM was consulted for help with evaluation management, while evaluating the patient in the emergency department he went into PEA cardiac arrest and ROSC was achieved after 2 minutes of CPR and 1 of epi.   Patient was admitted to ICU, he remained severely hypotensive requiring IV Levophed and epinephrine infusion, he underwent 2 more times PEA cardiac arrest, ROSC was achieved under 5 minutes, he remained severely  acidotic with pH 6.7, he was not ventilating due to dead space.  Despite maximal medical therapy patient's condition continued to deteriorate.  Patient's family was contacted, explained poor prognosis and complexity of illness, patient's family decided to keep him DNR and continue full scope of care.  Patient lost his pulse 1338 and was declared dead on 06-23-2021.  Patient's family was at bedside    Pertinent Labs and Studies  Significant Diagnostic Studies CT Head Wo Contrast  Result Date: 06/23/2021 CLINICAL DATA:  Altered mental status EXAM: CT HEAD WITHOUT CONTRAST TECHNIQUE: Contiguous axial images were obtained from the base of the skull through the vertex without intravenous contrast. COMPARISON:  None. FINDINGS: Brain: No acute intracranial hemorrhage, mass effect, or midline shift. No extra-axial fluid collections. Ventricles are mildly dilated including at the temporal horns, which may be out of proportion to the degree of cortical volume loss. Patchy hypodensities throughout the periventricular and subcortical white matter, likely secondary to chronic microvascular ischemic changes. Vascular: Calcified plaques in the carotid siphons. Skull: Normal. Negative for fracture or focal lesion. Sinuses/Orbits: Chronic appearing near complete opacification of the ethmoid sinuses. Small fluid levels in the bilateral sphenoid sinuses. Other: None. IMPRESSION: 1. No acute intracranial hemorrhage or mass effect. 2. Mildly dilated ventricles which may be slightly out of proportion to the degree of cortical volume loss. Correlate for possible normal pressure hydrocephalus. 3. Chronic microvascular ischemic changes. 4. Correlate for possible acute sphenoid sinusitis. Electronically Signed   By: Ofilia Neas M.D.   On: 2021/06/23 13:47   DG Chest Portable 1 View  Result Date: 2021/06/23 CLINICAL DATA:  Intubation, respiratory distress EXAM: PORTABLE CHEST 1 VIEW COMPARISON:  04/10/2021 FINDINGS:  Endotracheal tube is 2 cm above the carina. NG tube  enters the stomach. Bilateral perihilar and lower lobe airspace opacities. Heart is normal size. No visible effusions or pneumothorax. No acute bony abnormality. IMPRESSION: Bilateral perihilar and lower lobe airspace opacities could reflect edema or infection. Electronically Signed   By: Rolm Baptise M.D.   On: 12-Jul-2021 10:03   VAS [place] LOWER EXTREMITY VENOUS (DVT)  Result Date: 05/30/2021  Lower Venous DVT Study Patient Name:  KORE MADLOCK  Date of Exam:   05/30/2021 Medical Rec #: [id number]       Accession #:    [id number] Date of Birth: 03-Oct-1945       Patient Gender: M Patient Age:   76 years Exam Location:  [facility] Procedure:      VAS [place] LOWER EXTREMITY VENOUS (DVT) Referring Phys: Jerald Kief REGALADO --------------------------------------------------------------------------------  Indications: Edema.  Limitations: Bandages and staples Comparison Study: dvt noted during thrombectomy 10/17 and during arterial duplex                   study 10/30. Performing Technologist: Archie Patten RVS  Examination Guidelines: A complete evaluation includes B-mode imaging, spectral Doppler, color Doppler, and power Doppler as needed of all accessible portions of each vessel. Bilateral testing is considered an integral part of a complete examination. Limited examinations for reoccurring indications may be performed as noted. The reflux portion of the exam is performed with the patient in reverse Trendelenburg.  +--------+---------------+---------+-----------+----------+--------------------+  RIGHT    Compressibility Phasicity Spontaneity Properties Thrombus Aging        +--------+---------------+---------+-----------+----------+--------------------+  CFV                      Yes       Yes                    patent by color                                                                  doppler                +--------+---------------+---------+-----------+----------+--------------------+  SFJ                                                       Not well visualized   +--------+---------------+---------+-----------+----------+--------------------+  FV Prox                                                   Not well visualized   +--------+---------------+---------+-----------+----------+--------------------+  FV Mid   None            No        No                     Age Indeterminate     +--------+---------------+---------+-----------+----------+--------------------+  FV       None            No        No  Age Indeterminate      Distal                                                                          +--------+---------------+---------+-----------+----------+--------------------+   +---------+---------------+---------+-----------+----------+--------------+  LEFT      Compressibility Phasicity Spontaneity Properties Thrombus Aging  +---------+---------------+---------+-----------+----------+--------------+  CFV       Full            Yes       Yes                                    +---------+---------------+---------+-----------+----------+--------------+  SFJ       Full                                                             +---------+---------------+---------+-----------+----------+--------------+  FV Prox   Full                                                             +---------+---------------+---------+-----------+----------+--------------+  FV Mid    Full                                                             +---------+---------------+---------+-----------+----------+--------------+  FV Distal Full                                                             +---------+---------------+---------+-----------+----------+--------------+  PFV       Full                                                              +---------+---------------+---------+-----------+----------+--------------+  POP       Full            Yes       Yes                                    +---------+---------------+---------+-----------+----------+--------------+  PTV       Full                                                             +---------+---------------+---------+-----------+----------+--------------+  PERO      Full                                                             +---------+---------------+---------+-----------+----------+--------------+     Summary: RIGHT: - Findings consistent with age indeterminate deep vein thrombosis involving the right femoral vein. - Portions of this examination were limited- see technologist comments above.  LEFT: - There is no evidence of deep vein thrombosis in the lower extremity.  - No cystic structure found in the popliteal fossa.  *See table(s) above for measurements and observations. Electronically signed by Deitra Mayo MD on 05/30/2021 at 2:32:38 PM.    Final     Microbiology Recent Results (from the past 240 hour(s))  Resp Panel by RT-PCR (Flu A&B, Covid) Nasopharyngeal Swab     Status: Abnormal   Collection Time: 07/03/21  9:48 AM   Specimen: Nasopharyngeal Swab; Nasopharyngeal(NP) swabs in vial transport medium  Result Value Ref Range Status   SARS Coronavirus 2 by RT PCR POSITIVE (A) NEGATIVE Final    Comment: (NOTE) SARS-CoV-2 target nucleic acids are DETECTED.  The SARS-CoV-2 RNA is generally detectable in upper respiratory specimens during the acute phase of infection. Positive results are indicative of the presence of the identified virus, but do not rule out bacterial infection or co-infection with other pathogens not detected by the test. Clinical correlation with patient history and other diagnostic information is necessary to determine patient infection status. The expected result is Negative.  Fact Sheet for  Patients: EntrepreneurPulse.com.au  Fact Sheet for Healthcare Providers: IncredibleEmployment.be  This test is not yet approved or cleared by the [place] FDA and  has been authorized for detection and/or diagnosis of SARS-CoV-2 by FDA under an Emergency Use Authorization (EUA).  This EUA will remain in effect (meaning this test can be used) for the duration of  the COVID-19 declaration under Section 564(b)(1) of the A ct, 21 U.S.C. section 360bbb-3(b)(1), unless the authorization is terminated or revoked sooner.     Influenza A by PCR NEGATIVE NEGATIVE Final   Influenza B by PCR NEGATIVE NEGATIVE Final    Comment: (NOTE) The Xpert Xpress SARS-CoV-2/FLU/RSV plus assay is intended as an aid in the diagnosis of influenza from Nasopharyngeal swab specimens and should not be used as a sole basis for treatment. Nasal washings and aspirates are unacceptable for Xpert Xpress SARS-CoV-2/FLU/RSV testing.  Fact Sheet for Patients: EntrepreneurPulse.com.au  Fact Sheet for Healthcare Providers: IncredibleEmployment.be  This test is not yet approved or cleared by the [place] FDA and has been authorized for detection and/or diagnosis of SARS-CoV-2 by FDA under an Emergency Use Authorization (EUA). This EUA will remain in effect (meaning this test can be used) for the duration of the COVID-19 declaration under Section 564(b)(1) of the Act, 21 U.S.C. section 360bbb-3(b)(1), unless the authorization is terminated or revoked.  Performed at [facility], [place] [address], [place], [place] [address]     Lab Basic Metabolic Panel: Recent Labs  Lab 07/03/2021 0950 07/03/21 0954 07/03/2021 1036 July 03, 2021 1114 07-03-21 1259  NA 132* 131* 134* 138 138  K 7.4* 6.9* 5.4* 3.6 5.2*  CL 101 104  --   --   --   CO2 15*  --   --   --   --  GLUCOSE 222* 208*  --   --   --   BUN 22 31*  --   --   --   CREATININE  2.03* 2.10*  --   --   --   CALCIUM 8.0*  --   --   --   --   MG 1.8  --   --   --   --    Liver Function Tests: No results for input(s): AST, ALT, ALKPHOS, BILITOT, PROT, ALBUMIN in the last 168 hours. No results for input(s): LIPASE, AMYLASE in the last 168 hours. No results for input(s): AMMONIA in the last 168 hours. CBC: Recent Labs  Lab July 07, 2021 0950 07/07/2021 0954 07-07-2021 1036 2021-07-07 1114 2021/07/07 1259  WBC 310.0*  --   --   --   --   NEUTROABS 18.6*  --   --   --   --   HGB 9.2* 12.9* 10.9* 10.2* 11.6*  HCT 33.1* 38.0* 32.0* 30.0* 34.0*  MCV 103.8*  --   --   --   --   PLT 80*  --   --   --   --    Cardiac Enzymes: No results for input(s): CKTOTAL, CKMB, CKMBINDEX, TROPONINI in the last 168 hours. Sepsis Labs: Recent Labs  Lab 07-07-21 0950  WBC 310.0*  LATICACIDVEN >9.0*    Procedures/Operations     Sakia Schrimpf 2021/07/07, 3:43 PM

## 2021-06-25 NOTE — ED Notes (Signed)
Critical care speaking with family regarding patient condition. Blood noted now in ETT following last round of compressions

## 2021-06-25 NOTE — Care Plan (Signed)
GOALS OF CARE DISCUSSION   The Clinical status was relayed to daughter and patient's sister at bedside in detail.   Updated and notified of patients medical condition.     Patient remains unresponsive and will not open eyes to command.   He remained severely acidotic and underwent PEA cardiac arrest x 4 Explained to family course of therapy and the modalities  Signs of brain damage Evidence of kidney failure   Patient with Progressive multiorgan failure with a very high probablity of a very minimal chance of meaningful recovery despite all aggressive and optimal medical therapy.  Per family request Patient was made DNR, orders were written     Family are satisfied with Plan of action and management. All questions answered    Jacky Kindle MD [facility] See Amion for pager If no response to pager, please call [phone] until 7pm After 7pm, Please call E-link [phone]

## 2021-06-25 NOTE — ED Provider Notes (Signed)
[facility] EMERGENCY DEPARTMENT Provider Note   CSN: [id number] Arrival date & time: 06/26/21  1517     History No chief complaint on file.   Scott Garrett is a 76 y.o. male. Dementia, diabetes, hypertension, T-cell lymphoproliferative disorder, PVD status post AKA.  Presents to ER for respiratory distress per EMS report, staff found patient to be in respiratory distress and contacted EMS.  Happened this morning.  EMS report patient was initially alert and able to speak and short phrases but then became unresponsive while in route requiring BVM, heart rate suddenly dropped into the 40s and started to pace at rate of 65.  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  Concern for fluid collection, infection of groin wound.  Admitted for antibiotics and wound exploration.  Discharged to nursing facility on 12/8.  Has history of DVT on Eliquis.  HPI     Past Medical History:  Diagnosis Date   Colon polyps    Dementia ([place])    Diabetic retinopathy ([place])    DM2 (diabetes mellitus, type 2) ([place])    HTN (hypertension)    Leucocytosis     Patient Active Problem List   Diagnosis Date Noted   Wound infection after surgery 05/26/2021   Lymphoproliferative disease ([place]) 04/27/2021   Pressure injury of skin 04/23/2021   Critical limb ischemia of right lower extremity ([place]) 04/10/2021   Dementia ([place]) 04/10/2021   DM2 (diabetes mellitus, type 2) ([place]) 04/10/2021   HTN (hypertension) 04/10/2021   Leukocytosis 04/10/2021    Past Surgical History:  Procedure Laterality Date   AMPUTATION Right 04/14/2021   Procedure: RIGHT ABOVE KNEE AMPUTATION;  Surgeon: Angelia Mould, MD;  Location: [place];  Service: Vascular;  Laterality: Right;   COLONOSCOPY     FASCIOTOMY Right 04/10/2021   Procedure: FASCIOTOMY RIGHT LOWER EXTREMITY;  Surgeon: Waynetta Sandy, MD;  Location: [place];  Service: Vascular;  Laterality: Right;   FEMORAL-POPLITEAL BYPASS GRAFT Right 04/10/2021   Procedure: BYPASS GRAFT RIGHT FEMORAL-POPLITEAL ARTERY USING PROPATEN GRAFT;  Surgeon: Waynetta Sandy, MD;  Location: [place];  Service: Vascular;  Laterality: Right;   HERNIA REPAIR     groin, unsure of side   PATCH ANGIOPLASTY Right 05/26/2021   Procedure: PATCH ANGIOPLASTY OF RIGHT FEMORAL ARTERY USING RIGHT [place];  Surgeon: Waynetta Sandy, MD;  Location: [place];  Service: Vascular;  Laterality: Right;   REMOVAL OF GRAFT Right 05/26/2021   Procedure: RIGHT GROIN EXPOLRATION AND EXPLANTATION OF RIGHT FEMORAL GRAFT ; REMOVAL OF RIGHT STUMP STAPLES;  Surgeon: Waynetta Sandy, MD;  Location: [place];  Service: Vascular;  Laterality: Right;   THROMBECTOMY FEMORAL ARTERY Right 04/10/2021   Procedure: THROMBECTOMY RIGHT FEMORAL ARTERY, THROMBECTOMY RIGHT LOWER EXTREMITY;  Surgeon: Waynetta Sandy, MD;  Location: [place];  Service: Vascular;  Laterality: Right;       Family History  Family history unknown: Yes    Social History   Tobacco Use   Smoking status: Former    Types: Cigarettes    Quit date: 04/21/2021    Years since quitting: 0.1   Smokeless tobacco: Never   Tobacco comments:    Patient cannot smoke outside [facility]- he has to be able to get in a w/c and take himself outside.  Vaping Use   Vaping Use: Never used  Substance Use Topics   Alcohol use: Not Currently  Drug use: Not Currently    Home Medications Prior to Admission medications   Medication Sig Start Date End Date Taking? Authorizing Provider  acetaminophen (TYLENOL) 325 MG tablet Take 2 tablets (650 mg total) by mouth every 6 (six) hours as needed for mild pain (or Fever >/= 101). 05/31/21   Mercy Riding, MD  apixaban (ELIQUIS) 5 MG TABS tablet Take 1 tablet (5 mg total) by mouth 2 (two) times daily. 06/01/21   Mercy Riding, MD  atorvastatin  (LIPITOR) 20 MG tablet Take 1 tablet (20 mg total) by mouth daily. 05/31/21 05/31/22  Mercy Riding, MD  Cholecalciferol 25 MCG (1000 UT) TBDP Take 1,000 Units by mouth in the morning.    [provider]  donepezil (ARICEPT) 10 MG tablet Take 10 mg by mouth at bedtime.    [provider]  Ensure Max Protein (ENSURE MAX PROTEIN) LIQD Take 330 mLs (11 oz total) by mouth daily. 05/31/21   Mercy Riding, MD  insulin glargine (LANTUS) 100 UNIT/ML injection Inject 10 Units into the skin at bedtime.    [provider]  lisinopril (ZESTRIL) 5 MG tablet Take 5 mg by mouth daily.    [provider]  metFORMIN (GLUCOPHAGE) 500 MG tablet Take 500 mg by mouth 2 (two) times daily with a meal.    [provider]  Multiple Vitamin (MULTIVITAMIN WITH MINERALS) TABS tablet Take 1 tablet by mouth daily. 06/02/21   Mercy Riding, MD  Pollen Extracts (PROSTAT PO) Take 30 mLs by mouth 3 (three) times daily.    [provider]  senna-docusate (SENOKOT-S) 8.6-50 MG tablet Take 1 tablet by mouth 2 (two) times daily between meals as needed for mild constipation. 05/31/21   Mercy Riding, MD    Allergies    Patient has no known allergies.  Review of Systems   Review of Systems  Unable to perform ROS: Mental status change   Physical Exam Updated Vital Signs BP (!) 103/23 (BP Location: Right Arm)    Pulse (!) 109    Resp (!) 30    Wt 82 kg    SpO2 (!) 75% Comment: unsure if monitor is picking up correctly .   BMI 25.21 kg/m   Physical Exam Vitals and nursing note reviewed.  Constitutional:      Comments: Unresponsive  HENT:     Head: Normocephalic and atraumatic.     Mouth/Throat:     Mouth: Mucous membranes are dry.  Eyes:     Comments: Pupils 3 mm equal bilaterally  Cardiovascular:     Comments: Weak femoral pulse, irregular, bradycardic Pulmonary:     Comments: Breath sounds present by BVM, no spontaneous respirations Abdominal:     General: Abdomen is  flat. There is no distension.  Genitourinary:    Penis: Normal.   Musculoskeletal:     Cervical back: Neck supple.     Comments: Status post right AKA; right groin wound site looks intact, no swelling or induration  Skin:    General: Skin is warm and dry.  Neurological:     Comments: GCS 3    ED Results / Procedures / Treatments   Labs (all labs ordered are listed, but only abnormal results are displayed) Labs Reviewed  RESP PANEL BY RT-PCR (FLU A&B, COVID) ARPGX2 - Abnormal; Notable for the following components:      Result Value   SARS Coronavirus 2 by RT PCR POSITIVE (*)    All other components  within normal limits  CBC WITH DIFFERENTIAL/PLATELET - Abnormal; Notable for the following components:   WBC 310.0 (*)    RBC 3.19 (*)    Hemoglobin 9.2 (*)    HCT 33.1 (*)    MCV 103.8 (*)    MCHC 27.8 (*)    RDW 15.9 (*)    Platelets 80 (*)    Neutro Abs 18.6 (*)    Lymphs Abs 266.6 (*)    Monocytes Absolute 24.8 (*)    All other components within normal limits  PROTIME-INR - Abnormal; Notable for the following components:   Prothrombin Time 21.1 (*)    INR 1.8 (*)    All other components within normal limits  BRAIN NATRIURETIC PEPTIDE - Abnormal; Notable for the following components:   B Natriuretic Peptide 394.0 (*)    All other components within normal limits  I-STAT CHEM 8, ED - Abnormal; Notable for the following components:   Sodium 131 (*)    Potassium 6.9 (*)    BUN 31 (*)    Creatinine, Ser 2.10 (*)    Glucose, Bld 208 (*)    Calcium, Ion 1.07 (*)    TCO2 19 (*)    Hemoglobin 12.9 (*)    HCT 38.0 (*)    All other components within normal limits  I-STAT ARTERIAL BLOOD GAS, ED - Abnormal; Notable for the following components:   pH, Arterial 6.934 (*)    pCO2 arterial 49.8 (*)    pO2, Arterial 75 (*)    Bicarbonate 10.3 (*)    TCO2 12 (*)    Acid-base deficit 21.0 (*)    Sodium 134 (*)    Potassium 5.4 (*)    HCT 32.0 (*)    Hemoglobin 10.9 (*)    All  other components within normal limits  CULTURE, BLOOD (ROUTINE X 2)  CULTURE, BLOOD (ROUTINE X 2)  BASIC METABOLIC PANEL  MAGNESIUM  LACTIC ACID, PLASMA  LACTIC ACID, PLASMA  URINALYSIS, ROUTINE W REFLEX MICROSCOPIC  BLOOD GAS, ARTERIAL  I-STAT ARTERIAL BLOOD GAS, ED  TROPONIN I (HIGH SENSITIVITY)    EKG EKG Interpretation  Date/Time:  06/20/21 09:52:00 EST Ventricular Rate:  114 PR Interval:  130 QRS Duration: 101 QT Interval:  314 QTC Calculation: 433 R Axis:   -71 Text Interpretation: Sinus tachycardia Atrial premature complexes Probable left atrial enlargement Left anterior fascicular block Abnormal R-wave progression, late transition Nonspecific T abnrm, anterolateral leads Confirmed by Madalyn Rob [phone]) on June 20, 2021 10:24:18 AM  Radiology DG Chest Portable 1 View  Result Date: 06/20/21 CLINICAL DATA:  Intubation, respiratory distress EXAM: PORTABLE CHEST 1 VIEW COMPARISON:  04/10/2021 FINDINGS: Endotracheal tube is 2 cm above the carina. NG tube enters the stomach. Bilateral perihilar and lower lobe airspace opacities. Heart is normal size. No visible effusions or pneumothorax. No acute bony abnormality. IMPRESSION: Bilateral perihilar and lower lobe airspace opacities could reflect edema or infection. Electronically Signed   By: Rolm Baptise M.D.   On: June 20, 2021 10:03    Procedures .Critical Care Performed by: Lucrezia Starch, MD Authorized by: Lucrezia Starch, MD   Critical care provider statement:    Critical care time (minutes):  78   Critical care was necessary to treat or prevent imminent or life-threatening deterioration of the following conditions:  Cardiac failure, circulatory failure, CNS failure or compromise and dehydration   Critical care was time spent personally by me on the following activities:  Development of treatment plan with patient or  surrogate, discussions with consultants, evaluation of patient's response to  treatment, examination of patient, ordering and review of laboratory studies, ordering and review of radiographic studies, ordering and performing treatments and interventions, pulse oximetry, re-evaluation of patient's condition and review of old charts   Care discussed with: admitting provider   Date/Time: 06-20-2021 11:04 AM Performed by: Lucrezia Starch, MD Pre-anesthesia Checklist: Patient identified Oxygen Delivery Method: Ambu bag Preoxygenation: during code. Ventilation: Mask ventilation without difficulty Laryngoscope Size: Glidescope and 4 Grade View: Grade I Tube size: 7.5 mm Number of attempts: 1 Airway Equipment and Method: Rigid stylet and Video-laryngoscopy Placement Confirmation: ETT inserted through vocal cords under direct vision and Positive ETCO2 Secured at: 23 cm Tube secured with: ETT holder Comments: Intubated during CPR, no medications required    CPR  Date/Time: 06/20/2021 11:04 AM Performed by: Lucrezia Starch, MD Authorized by: Lucrezia Starch, MD  CPR Procedure Details:    ACLS/BLS initiated by EMS: No     CPR/ACLS performed in the ED: Yes     Duration of CPR (minutes):  5   Outcome: ROSC obtained    CPR performed via ACLS guidelines under my direct supervision.  See RN documentation for details including defibrillator use, medications, doses and timing. Comments:     2 epis, palpable pulse on pulse check, no shockable rhythms   Medications Ordered in ED Medications  EPINEPHrine (ADRENALIN) 5 mg in NS 250 mL (0.02 mg/mL) premix infusion (0 mcg/min Intravenous Stopped 06-20-2021 0941)  norepinephrine (LEVOPHED) 4mg  in 272mL (0.016 mg/mL) premix infusion (0 mcg/min Intravenous Paused June 20, 2021 0943)  calcium gluconate inj 10% (1 g) URGENT USE ONLY! (has no administration in time range)  azithromycin (ZITHROMAX) 500 mg in sodium chloride 0.9 % 250 mL IVPB (has no administration in time range)  propofol (DIPRIVAN) 1000 MG/100ML infusion (has no  administration in time range)  sodium bicarbonate 150 mEq in dextrose 5 % 1,150 mL infusion (has no administration in time range)  piperacillin-tazobactam (ZOSYN) IVPB 3.375 g (has no administration in time range)  vancomycin (VANCOREADY) IVPB 1750 mg/350 mL (has no administration in time range)  sodium chloride 0.9 % bolus 1,000 mL (has no administration in time range)  EPINEPHrine (ADRENALIN) 1 MG/10ML injection (1 mg Intravenous Given 06/20/2021 0932)  sodium chloride 0.9 % bolus 1,000 mL (0 mLs Intravenous Stopped 06/20/21 1055)  albuterol (PROVENTIL) (2.5 MG/3ML) 0.083% nebulizer solution 10 mg (10 mg Nebulization Given 2021/06/20 1001)  insulin aspart (novoLOG) injection 10 Units (10 Units Intravenous Given 2021-06-20 1036)    And  dextrose 50 % solution 50 mL (50 mLs Intravenous Given June 20, 2021 1037)  sodium bicarbonate injection 50 mEq (50 mEq Intravenous Given 06-20-2021 1037)  sodium bicarbonate injection 50 mEq (50 mEq Intravenous Given 20-Jun-2021 1043)    ED Course  I have reviewed the triage vital signs and the nursing notes.  Pertinent labs & imaging results that were available during my care of the patient were reviewed by me and considered in my medical decision making (see chart for details).  Clinical Course as of 20-Jun-2021 1107  20-Jun-2021  1001 Potassium(!!): 6.9 [RD]    Clinical Course User Index [RD] Lucrezia Starch, MD   MDM Rules/Calculators/A&P                         76 year old male with quite extensive past medical history presented to ER with concern for respiratory distress.  Per EMS report in route  he became unresponsive, bradycardic and then initiated pacing.  On arrival to ER, felt like I could palpate a very faint femoral pulse initially but did not seem to correlate with pacing.  Subsequently lost femoral pulse and CPR was initiated.  Intubated during CPR, no medications required.  Good pulse present on initial pulse check, received only a few minutes of  CPR.  Initially started Levophed drip the patient was subsequently hypertensive and titrated off.  Work-up concerning for pneumonia, started antibiotics.  Also noted hyperkalemia, ordered bicarb, calcium, insulin, albuterol. EKG is currently narrow complex unclear if arrest primary respiratory or metabolic. Concern for very poor prognosis. Attempted to contact family but did not have answer, nursing was subsequently able to get a hold of family.  Per recent admission he did appear to be full code.  Consult to critical care, ICU doctor came to bedside and assumed care. Had ordered propofol in case needed but he has not had any significant neuro function since arrival in ER, noted occasional spontaneous respiration.   Final Clinical Impression(s) / ED Diagnoses Final diagnoses:  Hyperkalemia  AKI (acute kidney injury) ([place])  Acute respiratory failure with hypoxia ([place])  Cardiac arrest [facility])    Rx / DC Orders ED Discharge Orders     None        Lucrezia Starch, MD 07-04-21 1107

## 2021-06-25 DEATH — deceased

## 2021-07-06 ENCOUNTER — Encounter (HOSPITAL_COMMUNITY): Payer: Self-pay

## 2022-09-13 IMAGING — CT CT CHEST-ABD-PELV W/O CM
2 of 4 series · 14 of 36 positions shown, 16 images · non-contrast
Comparison: None.

CLINICAL DATA: New diagnosis CLL, staging

EXAM:
CT CHEST, ABDOMEN AND PELVIS WITHOUT CONTRAST
TECHNIQUE: Multidetector CT imaging of the chest, abdomen and pelvis was
performed following the standard protocol without IV contrast.

[Series 3: cap wo 5.0 i31f 2 · axial · 0.81mm/px · z∈[-647,-112]mm · 11 of 129 slices shown, 13 images]
[im 11/129  mediastinal]
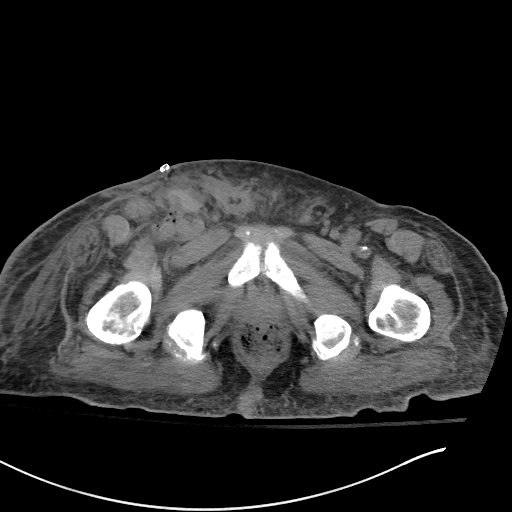
[im 11/129  bone]
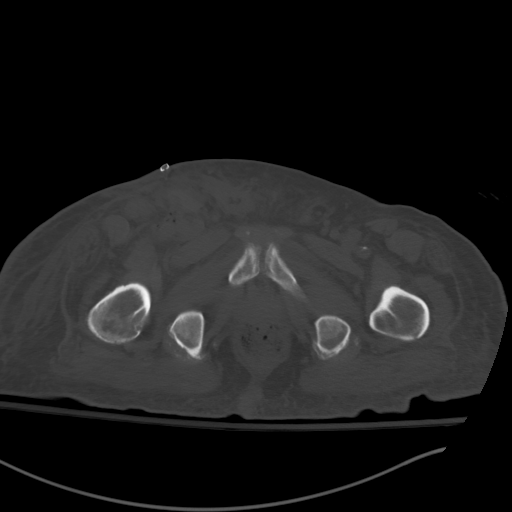
[im 22/129  mediastinal]
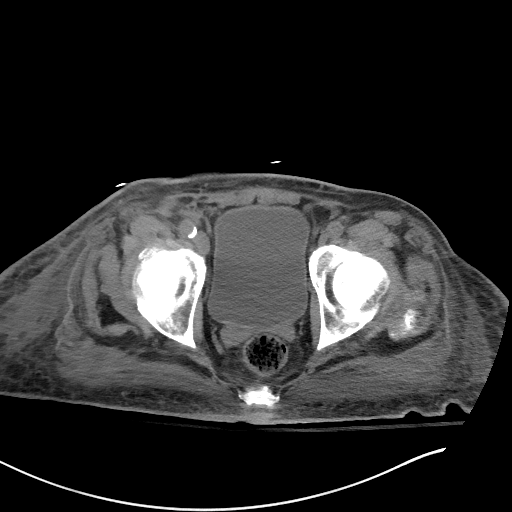
[im 33/129  mediastinal]
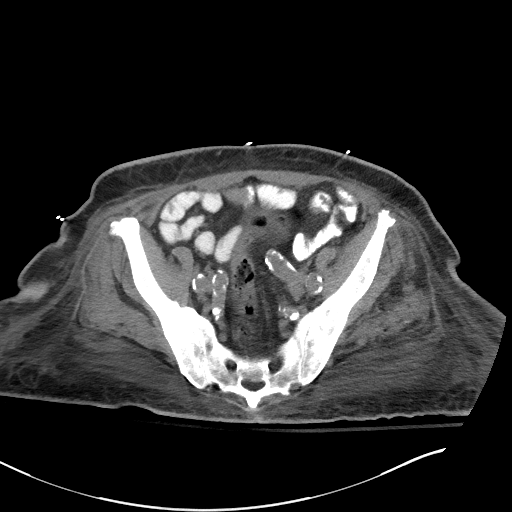
[im 43/129  mediastinal]
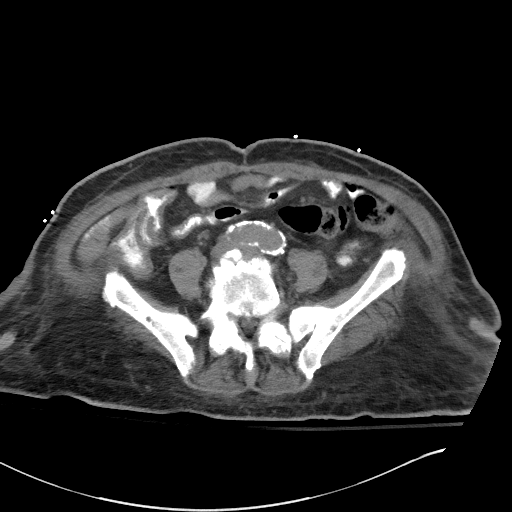
[im 54/129  mediastinal]
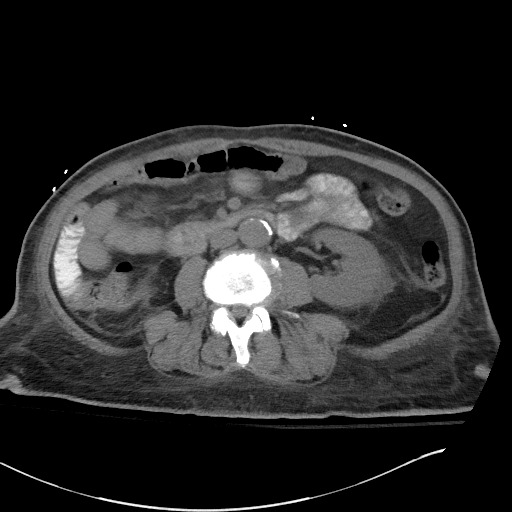
[im 65/129  mediastinal]
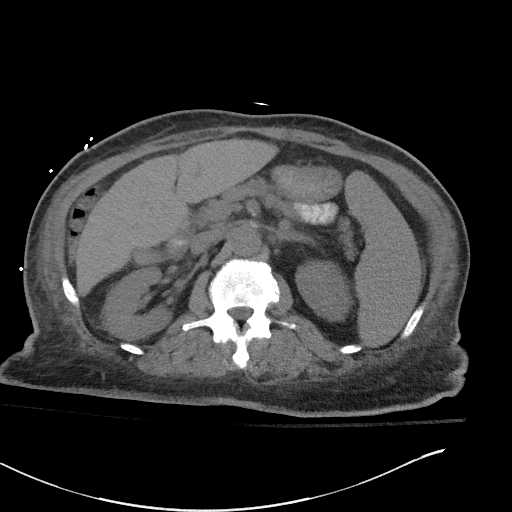
[im 75/129  mediastinal]
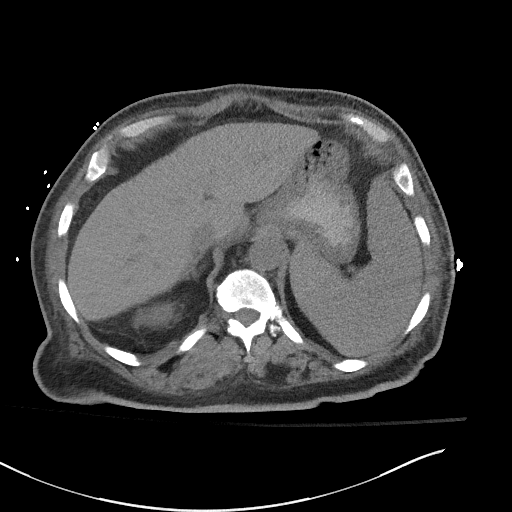
[im 86/129  mediastinal]
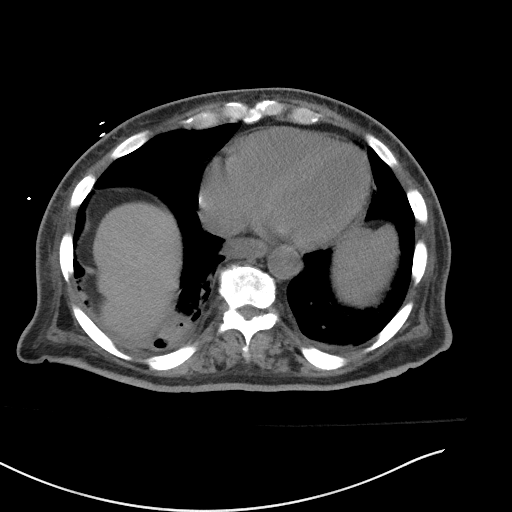
[im 97/129  mediastinal]
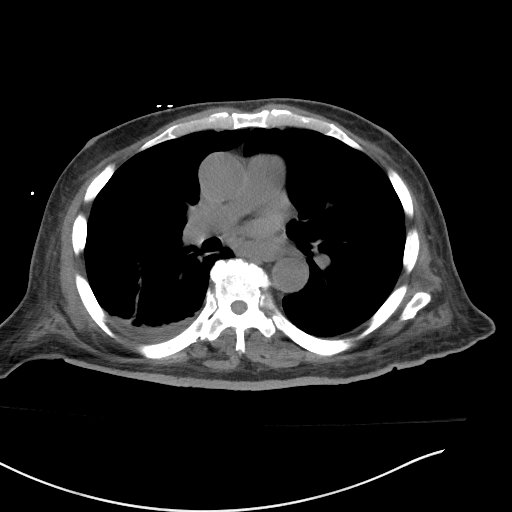
[im 97/129  bone]
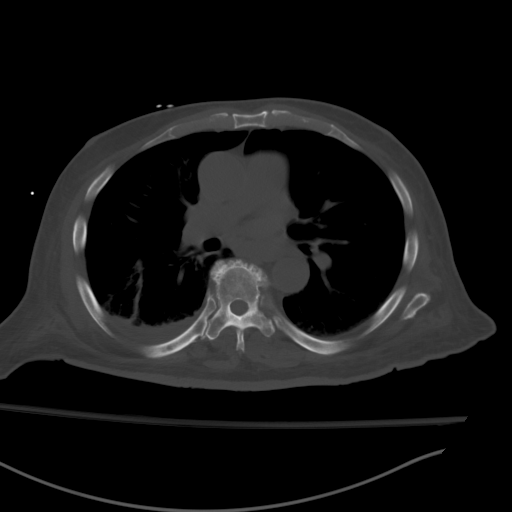
[im 107/129  mediastinal]
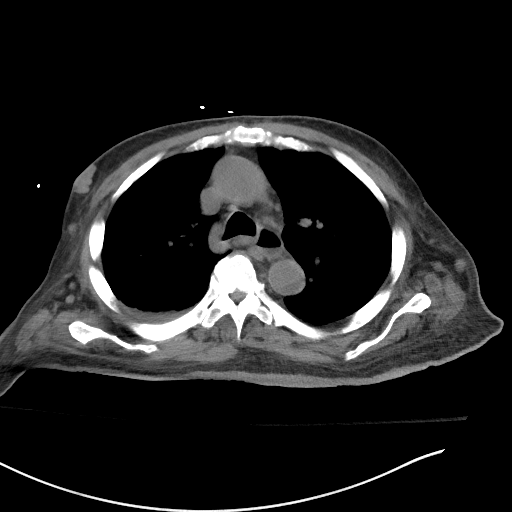
[im 118/129  mediastinal]
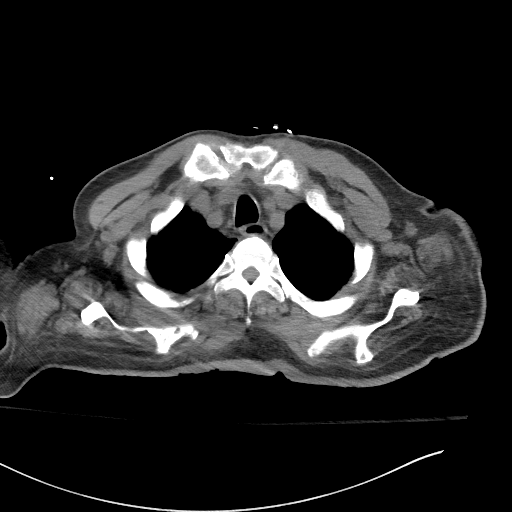

[Series 6: coronal · coronal · 0.73mm/px · 3 of 129 slices shown]
[im 26/129  mediastinal]
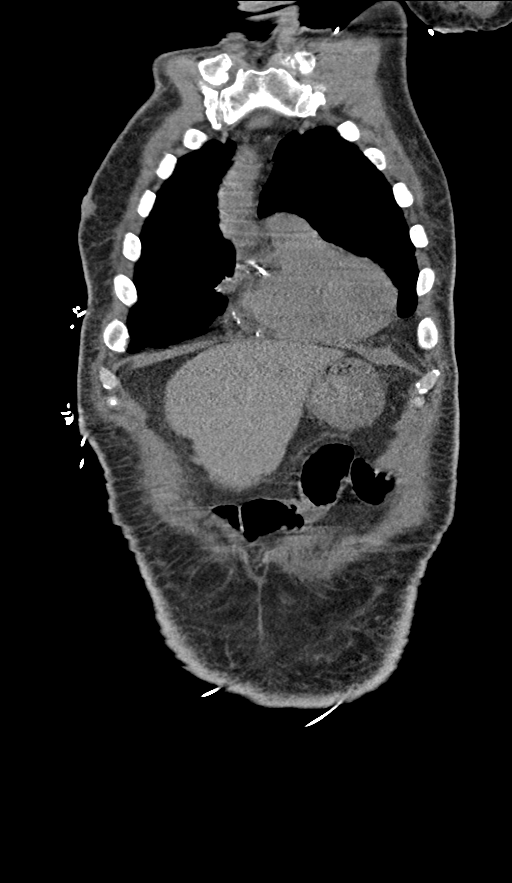
[im 52/129  mediastinal]
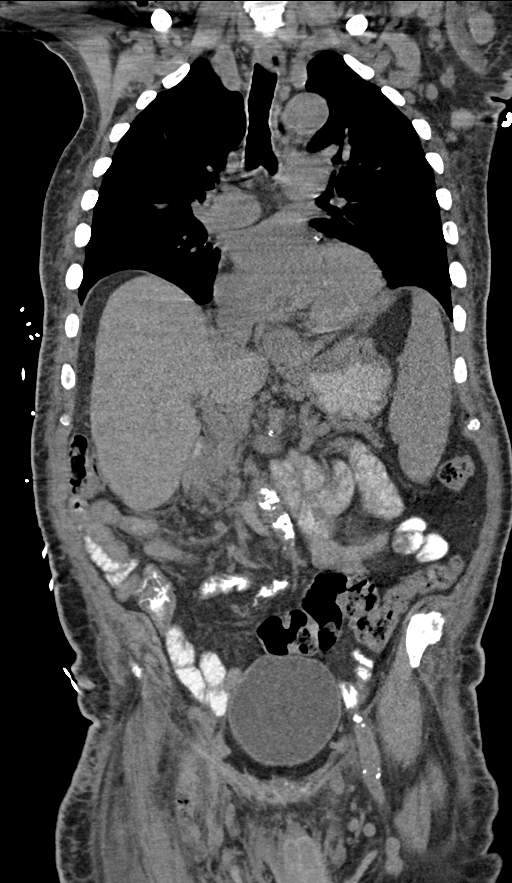
[im 77/129  mediastinal]
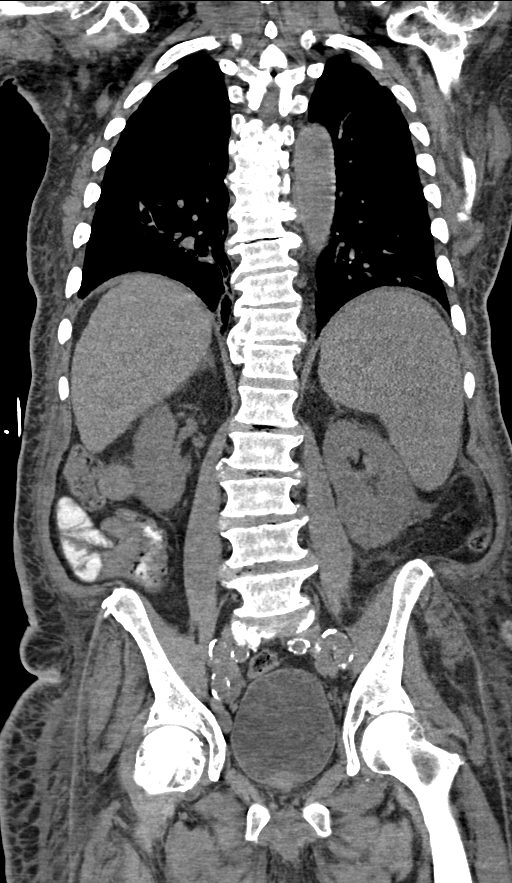

[14 of 36 positions shown; findings below may reference images not displayed]

FINDINGS: CT CHEST FINDINGS

Cardiovascular: Aortic atherosclerosis. Normal heart size.
Three-vessel coronary artery calcifications. No pericardial
effusion.

Mediastinum/Nodes: Prominent bilateral axillary lymph nodes, largest
right axillary nodes measuring up to 1.8 x 1.3 cm (series 3, image
21). No enlarged mediastinal or hilar lymph nodes. Thyroid gland,
trachea, and esophagus demonstrate no significant findings.

Lungs/Pleura: Mild centrilobular emphysema. Small bilateral pleural
effusions and associated atelectasis or consolidation. Underlying
bandlike scarring of the bilateral lung bases, right greater than
left.

Musculoskeletal: No chest wall mass or suspicious bone lesions
identified. Anasarca.

CT ABDOMEN PELVIS FINDINGS

Hepatobiliary: No solid liver abnormality is seen. No gallstones,
gallbladder wall thickening, or biliary dilatation.

Pancreas: Unremarkable. No pancreatic ductal dilatation or
surrounding inflammatory changes.

Spleen: Splenomegaly, maximum span 14.8 cm.

Adrenals/Urinary Tract: Adrenal glands are unremarkable. Kidneys are
normal, without renal calculi, solid lesion, or hydronephrosis.
Bladder is unremarkable.

Stomach/Bowel: Stomach is within normal limits. Appendix appears
normal. No evidence of bowel wall thickening, distention, or
inflammatory changes.

Vascular/Lymphatic: Aortic atherosclerosis. Enlarged bilateral
iliac, pelvic sidewall, and inguinal lymph nodes, largest left iliac
node measuring 2.4 x 1.5 cm (series 3, image 98), largest right
pelvic sidewall lymph node measuring 2.5 x 1.2 cm (series 3, image
106), and largest right inguinal nodes measuring 1.4 x 1.2 cm
(series 3, image 122).

Reproductive: No mass or other abnormality.

Other: No abdominal wall hernia. Anasarca. Incision over the right
femoral vessels with a subcutaneous hematoma or seroma measuring
x 3.2 cm. Scattered small air loculations within this collection
(series 3, image 116). No abdominopelvic ascites.

Musculoskeletal: No acute or significant osseous findings.
IMPRESSION: 1. Enlarged bilateral axillary, iliac, pelvic sidewall, and inguinal
lymph nodes, as detailed above and in keeping with reported
diagnosis of CLL. There is no lymphadenopathy internal to the chest
or in the upper abdomen.
2. Splenomegaly.
3. Incision over the right femoral vessels with a subcutaneous
hematoma or seroma measuring 5.4 x 3.2 cm. Scattered small air
loculations within this collection. Findings are consistent with
recent biopsy. The presence or absence of infection is not
established by CT.
4. Small bilateral pleural effusions and associated atelectasis or
consolidation. Underlying bandlike scarring of the bilateral lung
bases, right greater than left.
5. Mild emphysema.
6. Coronary artery disease.

Aortic Atherosclerosis (VMA6Q-PP0.0) and Emphysema (VMA6Q-ZCY.7).

## 2022-11-12 IMAGING — DX DG CHEST 1V PORT
1 series · 1 of 1 positions shown · non-contrast
Comparison: 04/10/2021

CLINICAL DATA: Intubation, respiratory distress

EXAM:
PORTABLE CHEST 1 VIEW

[chest ap]
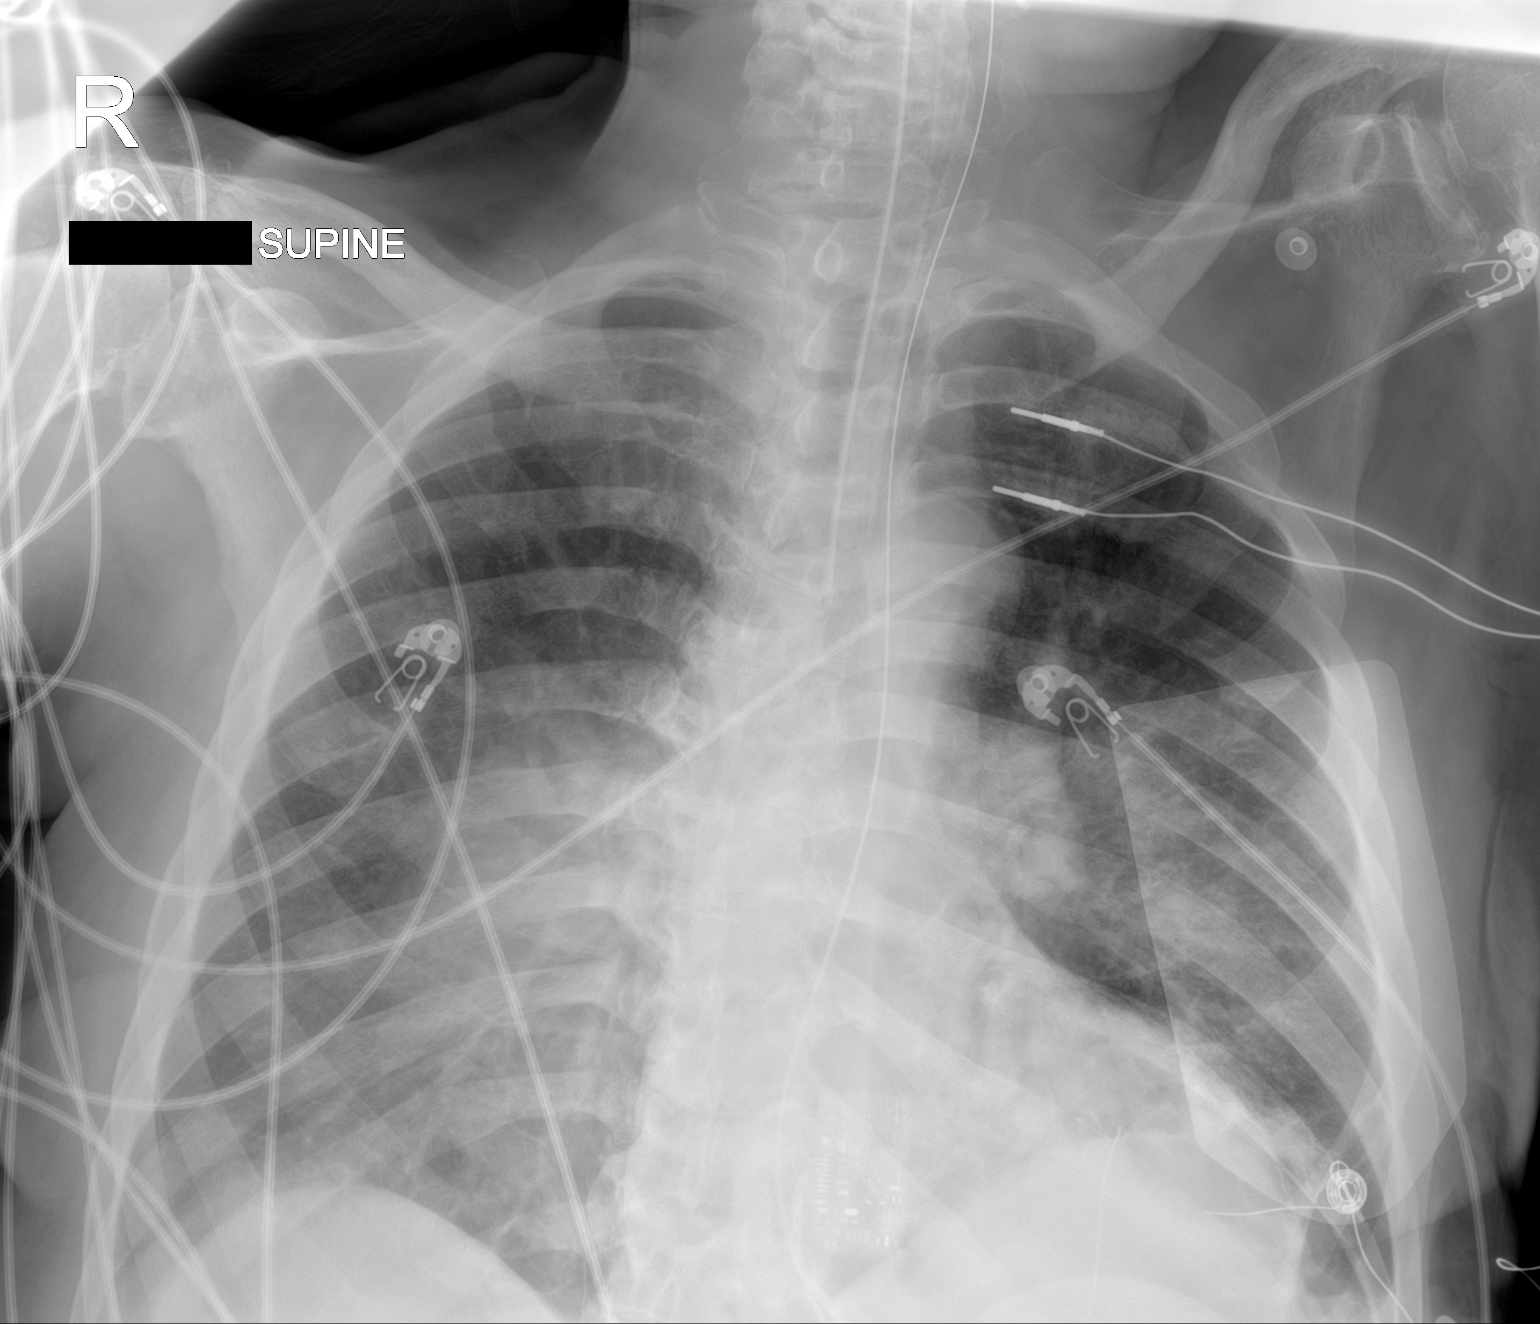

[1 of 1 positions shown; findings below may reference images not displayed]

FINDINGS: Endotracheal tube is 2 cm above the carina. NG tube enters the
stomach. Bilateral perihilar and lower lobe airspace opacities.
Heart is normal size. No visible effusions or pneumothorax. No acute
bony abnormality.
IMPRESSION: Bilateral perihilar and lower lobe airspace opacities could reflect
edema or infection.

## 2022-11-12 IMAGING — CT CT HEAD W/O CM
4 series · 16 of 47 positions shown, 18 images · non-contrast
Comparison: None.

CLINICAL DATA: Altered mental status

EXAM:
CT HEAD WITHOUT CONTRAST
TECHNIQUE: Contiguous axial images were obtained from the base of the skull
through the vertex without intravenous contrast.

[Series 3: head bone · axial · 0.49mm/px · z∈[-118,-86]mm · 3 of 80 slices shown]
[im 8/80  bone]
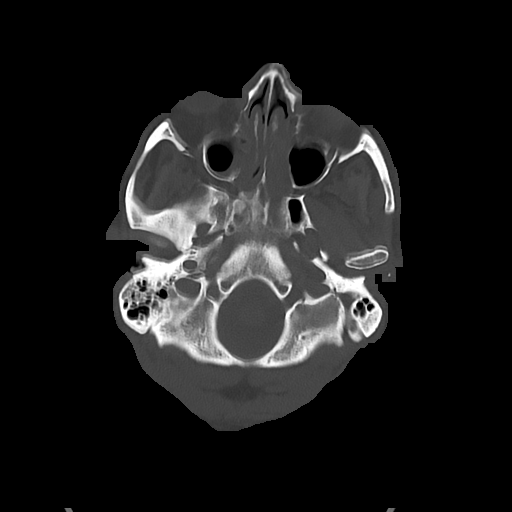
[im 16/80  bone]
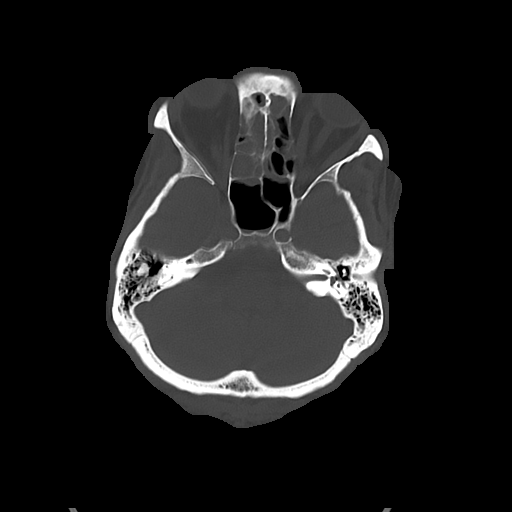
[im 24/80  bone]
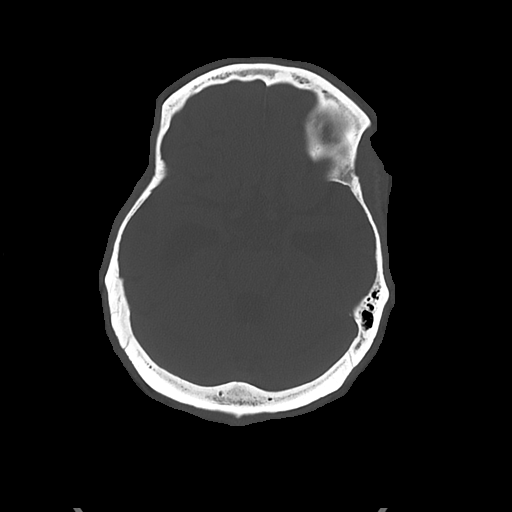

[Series 4: head wo · axial · 0.49mm/px · z∈[-118,+2]mm · 7 of 32 slices shown, 9 images]
[im 4/32  brain]
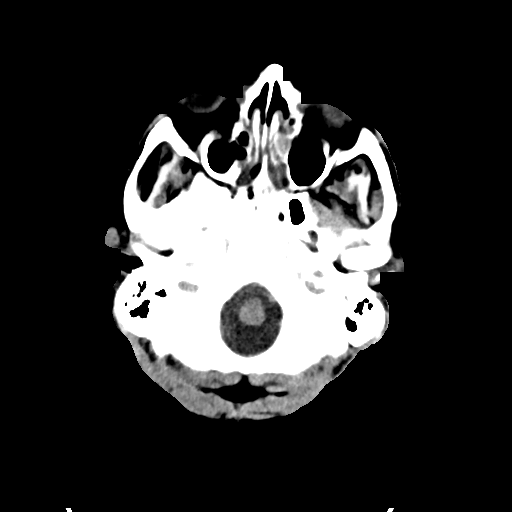
[im 4/32  bone]
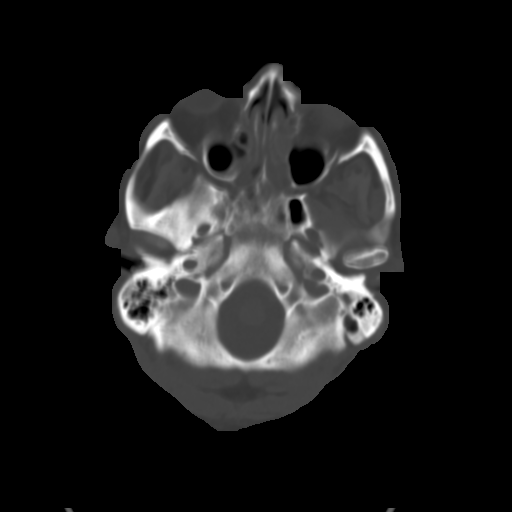
[im 8/32  brain]
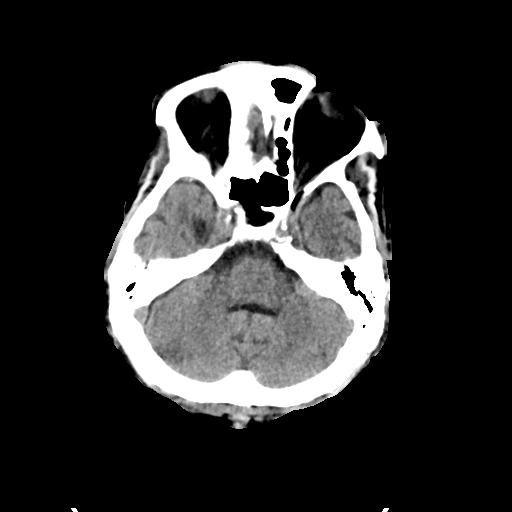
[im 12/32  brain]
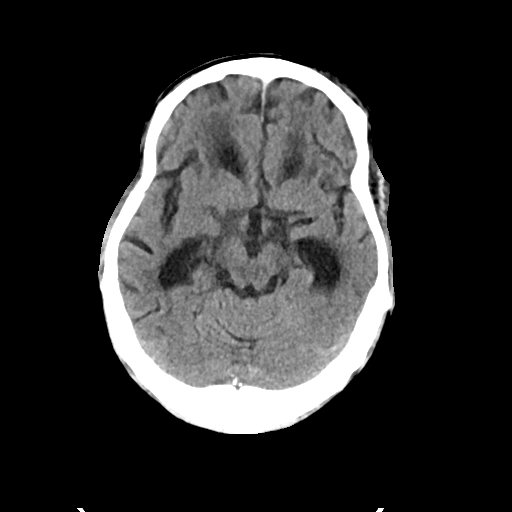
[im 16/32  brain]
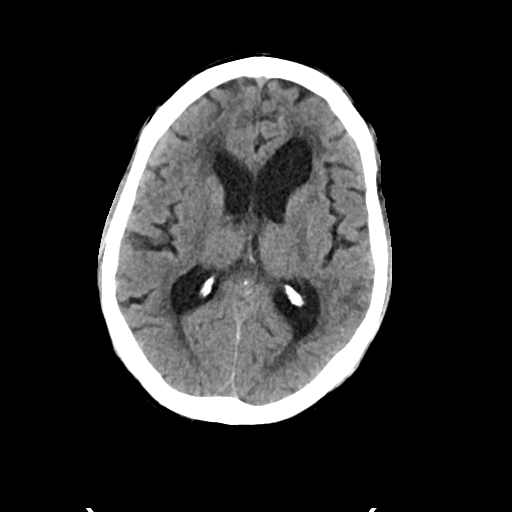
[im 20/32  brain]
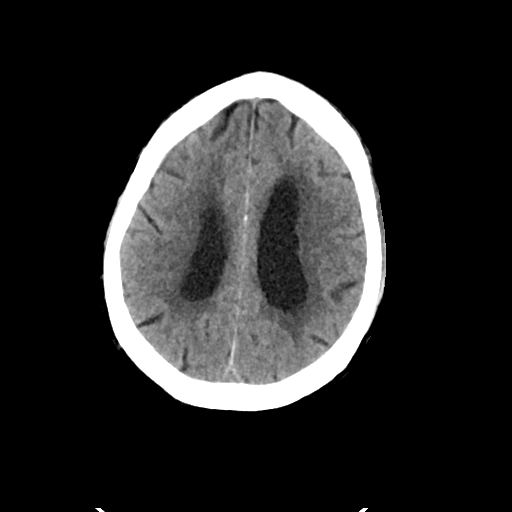
[im 20/32  bone]
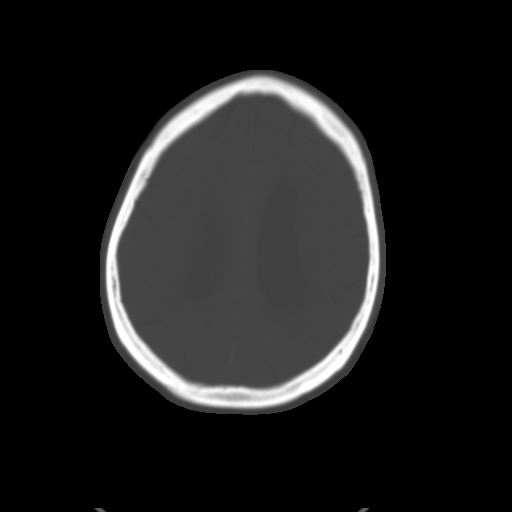
[im 24/32  brain]
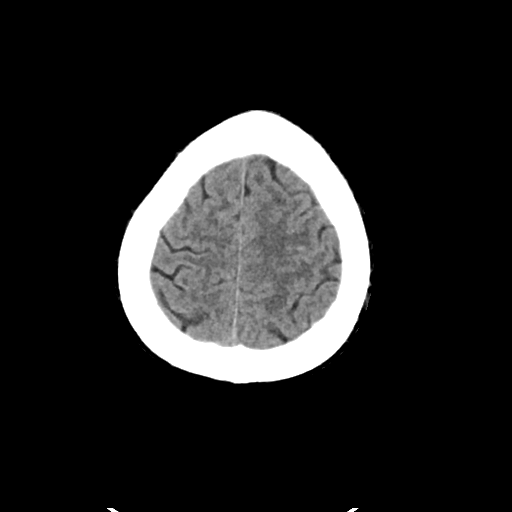
[im 28/32  brain]
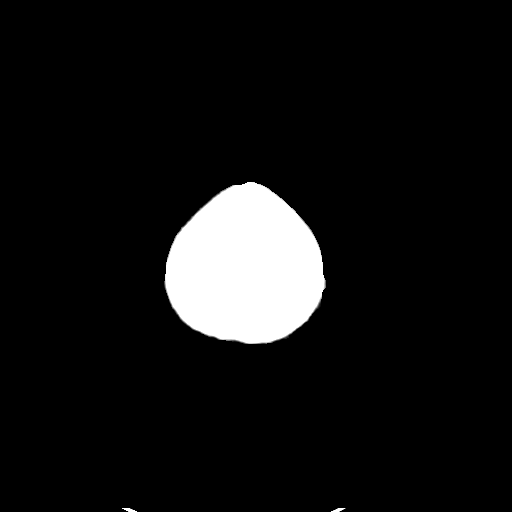

[Series 5: cor soft · coronal · 0.32mm/px · 3 of 68 slices shown]
[im 23/68  brain]
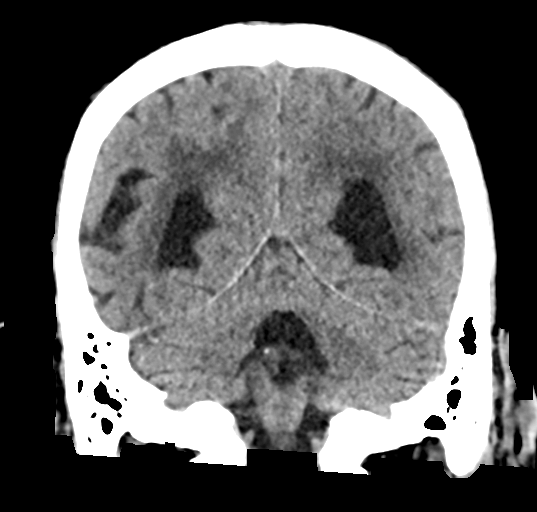
[im 30/68  brain]
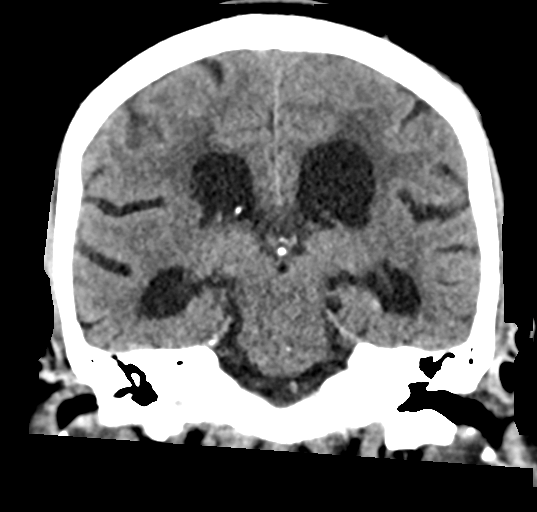
[im 38/68  brain]
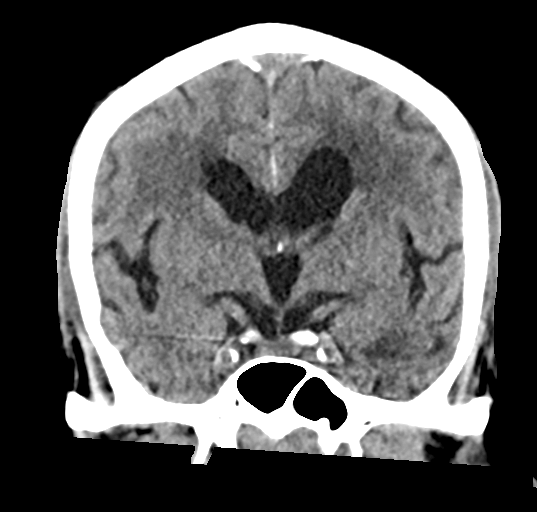

[Series 6: sag soft · sagittal · 0.33mm/px · 3 of 57 slices shown]
[im 19/57  brain]
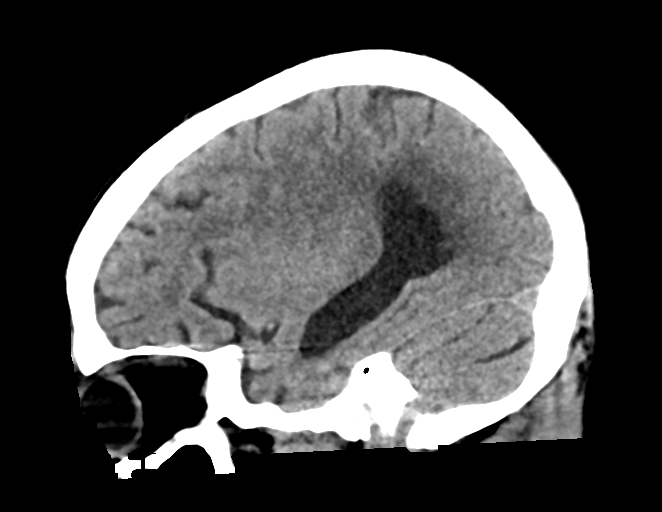
[im 29/57  brain]
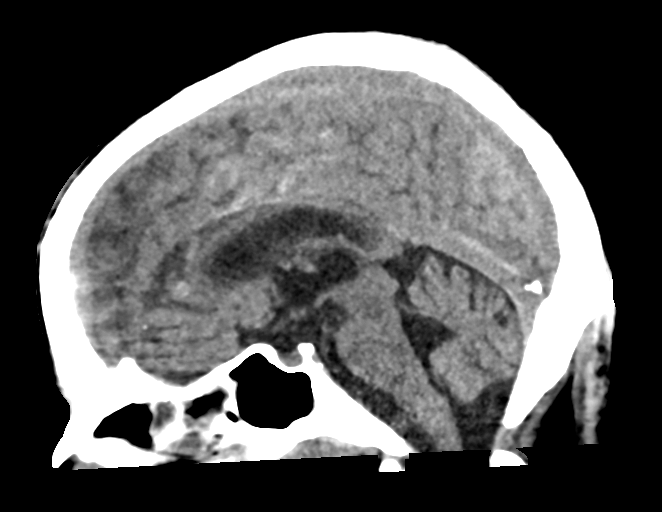
[im 38/57  brain]
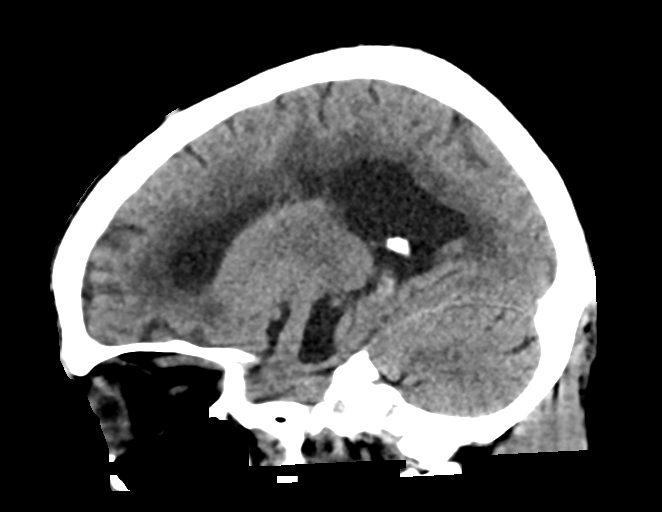

[16 of 47 positions shown; findings below may reference images not displayed]

FINDINGS: Brain: No acute intracranial hemorrhage, mass effect, or midline
shift. No extra-axial fluid collections. Ventricles are mildly
dilated including at the temporal horns, which may be out of
proportion to the degree of cortical volume loss. Patchy
hypodensities throughout the periventricular and subcortical white
matter, likely secondary to chronic microvascular ischemic changes.

Vascular: Calcified plaques in the carotid siphons.

Skull: Normal. Negative for fracture or focal lesion.

Sinuses/Orbits: Chronic appearing near complete opacification of the
ethmoid sinuses. Small fluid levels in the bilateral sphenoid
sinuses.

Other: None.
IMPRESSION: 1. No acute intracranial hemorrhage or mass effect.
2. Mildly dilated ventricles which may be slightly out of proportion
to the degree of cortical volume loss. Correlate for possible normal
pressure hydrocephalus.
3. Chronic microvascular ischemic changes.
4. Correlate for possible acute sphenoid sinusitis.

## 2023-06-13 LAB — MOLECULAR PATHOLOGY
